# Patient Record
Sex: Female | Born: 1945 | Race: White | Hispanic: No | Marital: Married | Smoking: Former smoker
Health system: Southern US, Community
[De-identification: ages and names within clinical notes are randomized; demographics above are authoritative.]

## PROBLEM LIST (undated history)

## (undated) DIAGNOSIS — I739 Peripheral vascular disease, unspecified: Secondary | ICD-10-CM

## (undated) DIAGNOSIS — I493 Ventricular premature depolarization: Secondary | ICD-10-CM

## (undated) DIAGNOSIS — I480 Paroxysmal atrial fibrillation: Secondary | ICD-10-CM

## (undated) DIAGNOSIS — D472 Monoclonal gammopathy: Secondary | ICD-10-CM

## (undated) DIAGNOSIS — I2510 Atherosclerotic heart disease of native coronary artery without angina pectoris: Secondary | ICD-10-CM

## (undated) DIAGNOSIS — I447 Left bundle-branch block, unspecified: Secondary | ICD-10-CM

## (undated) DIAGNOSIS — I495 Sick sinus syndrome: Secondary | ICD-10-CM

## (undated) DIAGNOSIS — Z9289 Personal history of other medical treatment: Secondary | ICD-10-CM

## (undated) DIAGNOSIS — I779 Disorder of arteries and arterioles, unspecified: Secondary | ICD-10-CM

## (undated) DIAGNOSIS — Z720 Tobacco use: Secondary | ICD-10-CM

## (undated) DIAGNOSIS — I10 Essential (primary) hypertension: Secondary | ICD-10-CM

## (undated) DIAGNOSIS — E785 Hyperlipidemia, unspecified: Secondary | ICD-10-CM

## (undated) HISTORY — DX: Essential (primary) hypertension: I10

## (undated) HISTORY — DX: Paroxysmal atrial fibrillation: I48.0

## (undated) HISTORY — DX: Tobacco use: Z72.0

## (undated) HISTORY — PX: TONSILLECTOMY: SUR1361

## (undated) HISTORY — DX: Atherosclerotic heart disease of native coronary artery without angina pectoris: I25.10

## (undated) HISTORY — PX: INGUINAL HERNIA REPAIR: SUR1180

## (undated) HISTORY — PX: OTHER SURGICAL HISTORY: SHX169

## (undated) HISTORY — DX: Left bundle-branch block, unspecified: I44.7

---

## 1998-07-14 ENCOUNTER — Other Ambulatory Visit: Admission: RE | Admit: 1998-07-14 | Discharge: 1998-07-14 | Payer: Self-pay | Admitting: Obstetrics and Gynecology

## 1999-09-09 ENCOUNTER — Other Ambulatory Visit: Admission: RE | Admit: 1999-09-09 | Discharge: 1999-09-09 | Payer: Self-pay | Admitting: Obstetrics and Gynecology

## 1999-09-09 ENCOUNTER — Encounter: Admission: RE | Admit: 1999-09-09 | Discharge: 1999-09-09 | Payer: Self-pay | Admitting: Obstetrics and Gynecology

## 1999-09-09 ENCOUNTER — Encounter: Payer: Self-pay | Admitting: Obstetrics and Gynecology

## 2001-08-15 ENCOUNTER — Ambulatory Visit (HOSPITAL_COMMUNITY): Admission: RE | Admit: 2001-08-15 | Discharge: 2001-08-15 | Payer: Self-pay | Admitting: Obstetrics and Gynecology

## 2001-08-15 ENCOUNTER — Encounter: Payer: Self-pay | Admitting: Obstetrics and Gynecology

## 2001-09-05 ENCOUNTER — Other Ambulatory Visit: Admission: RE | Admit: 2001-09-05 | Discharge: 2001-09-05 | Payer: Self-pay | Admitting: Obstetrics and Gynecology

## 2002-10-30 ENCOUNTER — Encounter: Admission: RE | Admit: 2002-10-30 | Discharge: 2002-10-30 | Payer: Self-pay | Admitting: Obstetrics and Gynecology

## 2002-10-30 ENCOUNTER — Encounter: Payer: Self-pay | Admitting: Obstetrics and Gynecology

## 2003-03-21 ENCOUNTER — Other Ambulatory Visit: Admission: RE | Admit: 2003-03-21 | Discharge: 2003-03-21 | Payer: Self-pay | Admitting: Obstetrics and Gynecology

## 2003-11-16 ENCOUNTER — Emergency Department (HOSPITAL_COMMUNITY): Admission: EM | Admit: 2003-11-16 | Discharge: 2003-11-16 | Payer: Self-pay | Admitting: Family Medicine

## 2003-12-03 ENCOUNTER — Encounter: Admission: RE | Admit: 2003-12-03 | Discharge: 2003-12-03 | Payer: Self-pay | Admitting: Obstetrics and Gynecology

## 2005-04-01 ENCOUNTER — Encounter: Admission: RE | Admit: 2005-04-01 | Discharge: 2005-04-01 | Payer: Self-pay | Admitting: Obstetrics and Gynecology

## 2006-04-03 ENCOUNTER — Encounter: Admission: RE | Admit: 2006-04-03 | Discharge: 2006-04-03 | Payer: Self-pay | Admitting: Obstetrics and Gynecology

## 2006-04-10 ENCOUNTER — Encounter: Admission: RE | Admit: 2006-04-10 | Discharge: 2006-04-10 | Payer: Self-pay | Admitting: Obstetrics and Gynecology

## 2006-04-17 ENCOUNTER — Encounter: Admission: RE | Admit: 2006-04-17 | Discharge: 2006-04-17 | Payer: Self-pay | Admitting: Obstetrics and Gynecology

## 2006-04-17 ENCOUNTER — Encounter (INDEPENDENT_AMBULATORY_CARE_PROVIDER_SITE_OTHER): Payer: Self-pay | Admitting: Specialist

## 2006-10-17 ENCOUNTER — Encounter: Admission: RE | Admit: 2006-10-17 | Discharge: 2006-10-17 | Payer: Self-pay | Admitting: Obstetrics and Gynecology

## 2006-12-15 ENCOUNTER — Encounter: Admission: RE | Admit: 2006-12-15 | Discharge: 2006-12-15 | Payer: Self-pay | Admitting: General Surgery

## 2007-06-29 ENCOUNTER — Encounter: Admission: RE | Admit: 2007-06-29 | Discharge: 2007-06-29 | Payer: Self-pay | Admitting: Cardiology

## 2007-07-25 ENCOUNTER — Ambulatory Visit: Payer: Self-pay | Admitting: Hematology & Oncology

## 2007-08-06 LAB — COMPREHENSIVE METABOLIC PANEL
ALT: 12 U/L (ref 0–35)
Albumin: 4.3 g/dL (ref 3.5–5.2)
CO2: 25 mEq/L (ref 19–32)
Calcium: 9.6 mg/dL (ref 8.4–10.5)
Chloride: 106 mEq/L (ref 96–112)
Creatinine, Ser: 0.64 mg/dL (ref 0.40–1.20)
Potassium: 3.9 mEq/L (ref 3.5–5.3)
Total Protein: 7.5 g/dL (ref 6.0–8.3)

## 2007-08-06 LAB — CBC & DIFF AND RETIC
Basophils Absolute: 0.1 10*3/uL (ref 0.0–0.1)
EOS%: 1.6 % (ref 0.0–7.0)
HCT: 35.8 % (ref 34.8–46.6)
HGB: 12.4 g/dL (ref 11.6–15.9)
IRF: 0.32 (ref 0.130–0.330)
MCH: 32.1 pg (ref 26.0–34.0)
MCV: 92.1 fL (ref 81.0–101.0)
NEUT%: 52.5 % (ref 39.6–76.8)
lymph#: 2.3 10*3/uL (ref 0.9–3.3)

## 2007-08-06 LAB — LACTATE DEHYDROGENASE: LDH: 159 U/L (ref 94–250)

## 2007-08-06 LAB — CHCC SMEAR

## 2007-08-08 LAB — SPEP & IFE WITH QIG
Alpha-1-Globulin: 5.1 % — ABNORMAL HIGH (ref 2.9–4.9)
Alpha-2-Globulin: 13.3 % — ABNORMAL HIGH (ref 7.1–11.8)
IgM, Serum: 118 mg/dL (ref 60–263)
Total Protein, Serum Electrophoresis: 7.6 g/dL (ref 6.0–8.3)

## 2007-08-08 LAB — BETA 2 MICROGLOBULIN, SERUM: Beta-2 Microglobulin: 1.47 mg/L (ref 1.01–1.73)

## 2007-08-09 ENCOUNTER — Encounter: Admission: RE | Admit: 2007-08-09 | Discharge: 2007-08-09 | Payer: Self-pay | Admitting: Cardiology

## 2007-08-16 ENCOUNTER — Ambulatory Visit (HOSPITAL_COMMUNITY): Admission: RE | Admit: 2007-08-16 | Discharge: 2007-08-16 | Payer: Self-pay | Admitting: Hematology & Oncology

## 2007-11-01 ENCOUNTER — Ambulatory Visit: Payer: Self-pay | Admitting: Hematology & Oncology

## 2007-11-05 LAB — CBC WITH DIFFERENTIAL (CANCER CENTER ONLY)
BASO%: 0.6 % (ref 0.0–2.0)
EOS%: 2.4 % (ref 0.0–7.0)
HCT: 37.1 % (ref 34.8–46.6)
LYMPH#: 2.4 10*3/uL (ref 0.9–3.3)
MCHC: 34.3 g/dL (ref 32.0–36.0)
MONO#: 0.5 10*3/uL (ref 0.1–0.9)
NEUT#: 3.3 10*3/uL (ref 1.5–6.5)
NEUT%: 52.3 % (ref 39.6–80.0)
RDW: 10.5 % (ref 10.5–14.6)
WBC: 6.4 10*3/uL (ref 3.9–10.0)

## 2007-11-07 LAB — COMPREHENSIVE METABOLIC PANEL
ALT: 12 U/L (ref 0–35)
AST: 19 U/L (ref 0–37)
Creatinine, Ser: 0.71 mg/dL (ref 0.40–1.20)
Total Bilirubin: 0.4 mg/dL (ref 0.3–1.2)

## 2007-11-07 LAB — PROTEIN ELECTROPHORESIS, SERUM
Alpha-1-Globulin: 4.9 % (ref 2.9–4.9)
Gamma Globulin: 15.1 % (ref 11.1–18.8)
M-Spike, %: 0.44 g/dL

## 2007-11-07 LAB — KAPPA/LAMBDA LIGHT CHAINS
Kappa:Lambda Ratio: 0.97 (ref 0.26–1.65)
Lambda Free Lght Chn: 1.76 mg/dL (ref 0.57–2.63)

## 2007-11-07 LAB — BETA 2 MICROGLOBULIN, SERUM: Beta-2 Microglobulin: 1.81 mg/L — ABNORMAL HIGH (ref 1.01–1.73)

## 2007-11-07 LAB — IGG, IGA, IGM
IgA: 273 mg/dL (ref 68–378)
IgM, Serum: 134 mg/dL (ref 60–263)

## 2008-02-27 ENCOUNTER — Ambulatory Visit: Payer: Self-pay | Admitting: Hematology & Oncology

## 2008-05-01 ENCOUNTER — Ambulatory Visit: Payer: Self-pay | Admitting: Hematology & Oncology

## 2008-05-02 LAB — CBC WITH DIFFERENTIAL (CANCER CENTER ONLY)
BASO#: 0.1 10*3/uL (ref 0.0–0.2)
BASO%: 0.8 % (ref 0.0–2.0)
HCT: 39 % (ref 34.8–46.6)
HGB: 13.3 g/dL (ref 11.6–15.9)
LYMPH#: 2.2 10*3/uL (ref 0.9–3.3)
LYMPH%: 32 % (ref 14.0–48.0)
MCV: 92 fL (ref 81–101)
MONO#: 0.5 10*3/uL (ref 0.1–0.9)
NEUT%: 57 % (ref 39.6–80.0)
RDW: 11 % (ref 10.5–14.6)
WBC: 6.8 10*3/uL (ref 3.9–10.0)

## 2008-05-06 LAB — PROTEIN ELECTROPHORESIS, SERUM
Albumin ELP: 53.6 % — ABNORMAL LOW (ref 55.8–66.1)
Alpha-1-Globulin: 4.9 % (ref 2.9–4.9)
Beta 2: 4.9 % (ref 3.2–6.5)
Beta Globulin: 6.1 % (ref 4.7–7.2)

## 2008-05-06 LAB — COMPREHENSIVE METABOLIC PANEL
Alkaline Phosphatase: 69 U/L (ref 39–117)
BUN: 11 mg/dL (ref 6–23)
Creatinine, Ser: 0.64 mg/dL (ref 0.40–1.20)
Glucose, Bld: 74 mg/dL (ref 70–99)
Total Bilirubin: 0.4 mg/dL (ref 0.3–1.2)

## 2008-05-06 LAB — IGG, IGA, IGM: IgM, Serum: 148 mg/dL (ref 60–263)

## 2008-06-12 ENCOUNTER — Ambulatory Visit: Payer: Self-pay | Admitting: Internal Medicine

## 2008-06-12 ENCOUNTER — Inpatient Hospital Stay (HOSPITAL_COMMUNITY): Admission: EM | Admit: 2008-06-12 | Discharge: 2008-06-18 | Payer: Self-pay | Admitting: Emergency Medicine

## 2008-06-17 ENCOUNTER — Ambulatory Visit: Payer: Self-pay | Admitting: Vascular Surgery

## 2008-06-17 ENCOUNTER — Encounter: Payer: Self-pay | Admitting: Cardiology

## 2008-10-06 ENCOUNTER — Encounter: Admission: RE | Admit: 2008-10-06 | Discharge: 2008-10-06 | Payer: Self-pay | Admitting: Obstetrics and Gynecology

## 2008-10-24 ENCOUNTER — Ambulatory Visit: Payer: Self-pay | Admitting: Hematology & Oncology

## 2008-10-27 LAB — CBC WITH DIFFERENTIAL (CANCER CENTER ONLY)
Eosinophils Absolute: 0.1 10*3/uL (ref 0.0–0.5)
HCT: 37.9 % (ref 34.8–46.6)
LYMPH#: 2.3 10*3/uL (ref 0.9–3.3)
LYMPH%: 48.5 % — ABNORMAL HIGH (ref 14.0–48.0)
MCV: 93 fL (ref 81–101)
MONO#: 0.4 10*3/uL (ref 0.1–0.9)
NEUT%: 40.9 % (ref 39.6–80.0)
Platelets: 313 10*3/uL (ref 145–400)
RBC: 4.1 10*6/uL (ref 3.70–5.32)
WBC: 4.8 10*3/uL (ref 3.9–10.0)

## 2008-10-29 LAB — COMPREHENSIVE METABOLIC PANEL
ALT: 10 U/L (ref 0–35)
Albumin: 4.2 g/dL (ref 3.5–5.2)
CO2: 24 mEq/L (ref 19–32)
Calcium: 8.9 mg/dL (ref 8.4–10.5)
Chloride: 108 mEq/L (ref 96–112)
Glucose, Bld: 83 mg/dL (ref 70–99)
Sodium: 142 mEq/L (ref 135–145)
Total Bilirubin: 0.4 mg/dL (ref 0.3–1.2)
Total Protein: 6.9 g/dL (ref 6.0–8.3)

## 2008-10-29 LAB — IGG, IGA, IGM
IgA: 265 mg/dL (ref 68–378)
IgG (Immunoglobin G), Serum: 1230 mg/dL (ref 694–1618)

## 2008-10-29 LAB — PROTEIN ELECTROPHORESIS, SERUM
Albumin ELP: 57.4 % (ref 55.8–66.1)
Alpha-1-Globulin: 3.9 % (ref 2.9–4.9)
Alpha-2-Globulin: 10.7 % (ref 7.1–11.8)
Beta 2: 4.6 % (ref 3.2–6.5)
Beta Globulin: 6.1 % (ref 4.7–7.2)
Gamma Globulin: 17.3 % (ref 11.1–18.8)
Total Protein, Serum Electrophoresis: 6.9 g/dL (ref 6.0–8.3)

## 2008-10-29 LAB — KAPPA/LAMBDA LIGHT CHAINS
Kappa free light chain: 1.86 mg/dL (ref 0.33–1.94)
Kappa:Lambda Ratio: 0.95 (ref 0.26–1.65)
Lambda Free Lght Chn: 1.95 mg/dL (ref 0.57–2.63)

## 2009-03-06 ENCOUNTER — Emergency Department (HOSPITAL_COMMUNITY): Admission: EM | Admit: 2009-03-06 | Discharge: 2009-03-06 | Payer: Self-pay | Admitting: Emergency Medicine

## 2009-03-07 ENCOUNTER — Ambulatory Visit: Payer: Self-pay | Admitting: Internal Medicine

## 2009-03-07 ENCOUNTER — Inpatient Hospital Stay (HOSPITAL_COMMUNITY): Admission: EM | Admit: 2009-03-07 | Discharge: 2009-03-08 | Payer: Self-pay | Admitting: Emergency Medicine

## 2009-10-07 ENCOUNTER — Encounter: Admission: RE | Admit: 2009-10-07 | Discharge: 2009-10-07 | Payer: Self-pay | Admitting: Obstetrics and Gynecology

## 2010-02-15 ENCOUNTER — Encounter: Admission: RE | Admit: 2010-02-15 | Discharge: 2010-02-15 | Payer: Self-pay | Admitting: Cardiology

## 2010-03-09 ENCOUNTER — Ambulatory Visit: Payer: Self-pay | Admitting: Cardiology

## 2010-04-02 ENCOUNTER — Ambulatory Visit: Payer: Self-pay | Admitting: Cardiology

## 2010-05-06 ENCOUNTER — Observation Stay (HOSPITAL_COMMUNITY)
Admission: EM | Admit: 2010-05-06 | Discharge: 2010-05-07 | Payer: Self-pay | Source: Home / Self Care | Attending: Cardiology | Admitting: Cardiology

## 2010-05-09 ENCOUNTER — Encounter: Payer: Self-pay | Admitting: Cardiology

## 2010-05-10 ENCOUNTER — Telehealth (INDEPENDENT_AMBULATORY_CARE_PROVIDER_SITE_OTHER): Payer: Self-pay | Admitting: *Deleted

## 2010-05-10 LAB — DIFFERENTIAL
Lymphs Abs: 2.6 10*3/uL (ref 0.7–4.0)
Monocytes Relative: 10 % (ref 3–12)
Neutro Abs: 2.5 10*3/uL (ref 1.7–7.7)
Neutrophils Relative %: 42 % — ABNORMAL LOW (ref 43–77)

## 2010-05-10 LAB — CARDIAC PANEL(CRET KIN+CKTOT+MB+TROPI)
CK, MB: 1.6 ng/mL (ref 0.3–4.0)
CK, MB: 1.4 ng/mL (ref 0.3–4.0)
Total CK: 73 U/L (ref 7–177)
Troponin I: 0.02 ng/mL (ref 0.00–0.06)

## 2010-05-10 LAB — LIPID PANEL
Cholesterol: 218 mg/dL — ABNORMAL HIGH (ref 0–200)
HDL: 42 mg/dL (ref >39)
Triglycerides: 95 mg/dL (ref <150)

## 2010-05-10 LAB — CBC
HCT: 37.1 % (ref 36.0–46.0)
Hemoglobin: 12.2 g/dL (ref 12.0–15.0)
Platelets: 290 10*3/uL (ref 150–400)

## 2010-05-10 LAB — POCT CARDIAC MARKERS
CKMB, poc: 1.3 ng/mL (ref 1.0–8.0)
Troponin i, poc: <0.05 ng/mL (ref 0.00–0.09)

## 2010-05-10 LAB — BASIC METABOLIC PANEL
CO2: 25 mEq/L (ref 19–32)
Calcium: 9 mg/dL (ref 8.4–10.5)
Chloride: 108 mEq/L (ref 96–112)
Creatinine, Ser: 0.61 mg/dL (ref 0.4–1.2)
Potassium: 3.7 mEq/L (ref 3.5–5.1)

## 2010-05-10 LAB — TSH: TSH: 3.758 u[IU]/mL (ref 0.350–4.500)

## 2010-05-10 LAB — TROPONIN I: Troponin I: 0.02 ng/mL (ref 0.00–0.06)

## 2010-05-11 ENCOUNTER — Encounter (HOSPITAL_COMMUNITY)
Admission: RE | Admit: 2010-05-11 | Discharge: 2010-05-18 | Payer: Self-pay | Source: Home / Self Care | Attending: Cardiology | Admitting: Cardiology

## 2010-05-11 ENCOUNTER — Encounter: Payer: Self-pay | Admitting: Cardiovascular Disease

## 2010-05-11 ENCOUNTER — Ambulatory Visit: Admission: RE | Admit: 2010-05-11 | Discharge: 2010-05-11 | Payer: Self-pay | Source: Home / Self Care

## 2010-05-13 ENCOUNTER — Inpatient Hospital Stay (HOSPITAL_COMMUNITY)
Admission: AD | Admit: 2010-05-13 | Discharge: 2010-05-15 | Payer: Self-pay | Source: Home / Self Care | Attending: Cardiology | Admitting: Cardiology

## 2010-05-13 LAB — BASIC METABOLIC PANEL
BUN: 14 mg/dL (ref 6–23)
Chloride: 105 mEq/L (ref 96–112)
GFR calc non Af Amer: >60 mL/min (ref >60)
Potassium: 4 mEq/L (ref 3.5–5.1)
Sodium: 140 mEq/L (ref 135–145)

## 2010-05-13 LAB — PROTIME-INR
INR: 0.94 (ref 0.00–1.49)
Prothrombin Time: 12.8 seconds (ref 11.6–15.2)

## 2010-05-13 LAB — CBC
HCT: 34.5 % — ABNORMAL LOW (ref 36.0–46.0)
Hemoglobin: 11.3 g/dL — ABNORMAL LOW (ref 12.0–15.0)
MCHC: 32.8 g/dL (ref 30.0–36.0)
RBC: 3.64 MIL/uL — ABNORMAL LOW (ref 3.87–5.11)

## 2010-05-13 LAB — CARDIAC PANEL(CRET KIN+CKTOT+MB+TROPI)
CK, MB: 2.5 ng/mL (ref 0.3–4.0)
Relative Index: INVALID (ref 0.0–2.5)
Total CK: 78 U/L (ref 7–177)
Troponin I: 0.01 ng/mL (ref 0.00–0.06)

## 2010-05-14 ENCOUNTER — Ambulatory Visit: Payer: Self-pay | Admitting: Cardiology

## 2010-05-14 HISTORY — PX: CARDIAC CATHETERIZATION: SHX172

## 2010-05-14 LAB — CBC
HCT: 34.2 % — ABNORMAL LOW (ref 36.0–46.0)
Hemoglobin: 11.4 g/dL — ABNORMAL LOW (ref 12.0–15.0)
MCH: 31.1 pg (ref 26.0–34.0)
MCHC: 33.3 g/dL (ref 30.0–36.0)
MCV: 93.4 fL (ref 78.0–100.0)
RDW: 13.2 % (ref 11.5–15.5)

## 2010-05-14 LAB — BASIC METABOLIC PANEL
BUN: 17 mg/dL (ref 6–23)
CO2: 27 mEq/L (ref 19–32)
Calcium: 8.6 mg/dL (ref 8.4–10.5)
Creatinine, Ser: 0.65 mg/dL (ref 0.4–1.2)
GFR calc non Af Amer: >60 mL/min (ref >60)
Glucose, Bld: 84 mg/dL (ref 70–99)
Sodium: 140 mEq/L (ref 135–145)

## 2010-05-14 LAB — CARDIAC PANEL(CRET KIN+CKTOT+MB+TROPI)
CK, MB: 2 ng/mL (ref 0.3–4.0)
Troponin I: 0.01 ng/mL (ref 0.00–0.06)

## 2010-05-15 LAB — BASIC METABOLIC PANEL
CO2: 28 mEq/L (ref 19–32)
Chloride: 105 mEq/L (ref 96–112)
Creatinine, Ser: 0.67 mg/dL (ref 0.4–1.2)
GFR calc Af Amer: >60 mL/min (ref >60)
Glucose, Bld: 93 mg/dL (ref 70–99)

## 2010-05-15 LAB — CBC
MCH: 30.6 pg (ref 26.0–34.0)
MCHC: 32.3 g/dL (ref 30.0–36.0)
MCV: 94.6 fL (ref 78.0–100.0)
Platelets: 282 10*3/uL (ref 150–400)
RBC: 3.86 MIL/uL — ABNORMAL LOW (ref 3.87–5.11)

## 2010-05-15 LAB — PLATELET INHIBITION P2Y12: P2Y12 % Inhibition: 15 %

## 2010-05-16 NOTE — H&P (Signed)
Katherine [name], SPANG NO.:  [id number]  MEDICAL RECORD NO.:  [id number]          PATIENT TYPE:  OBV  LOCATION:  3701                         FACILITY:  [facility]  PHYSICIAN:  Luis Abed, MD, FACCDATE OF BIRTH:  June 15, 1945  DATE OF ADMISSION:  05/06/2010 DATE OF DISCHARGE:                             HISTORY & PHYSICAL   PRIMARY CARE PHYSICIAN AND CARDIOLOGIST:  Cassell Clement, MD  CHIEF COMPLAINT:  Chest pain.  HISTORY OF PRESENT ILLNESS:  [name] is a 65 year old female with no previous history of coronary artery disease.  She had an episode of chest pain last Friday.  This started at rest but after eating.  She took aspirin 325 mg when her symptoms increased to a 7 or 8/10.  Her symptoms resolved after about 30 minutes without any other intervention. This a.m. at approximately 3 o'clock, she was wakened by substernal chest pain she describes as a pressure.  It was a 7 or 8/10.  It did not radiate.  There were no associated symptoms.  The pain was as severe as the previous episode and she took another aspirin.  When her symptoms did not resolve immediately, EMS was called but the symptoms had begun to ease off by the time they got there and the total duration of her chest pain was about 45 minutes.  These were the only 2 episodes of chest pain she has ever had.  Generally she cleans house, mops, and vacuums as well as walks occasionally in the gym without symptoms.  She also takes care of her grandchildren.  She is busy but admits that her activity level is not that high.  Currently she is resting comfortably.  PAST MEDICAL HISTORY: 1. Hypertension. 2. Hyperlipidemia. 3. Family history of coronary artery disease. 4 . Ongoing tobacco use. 1. Remote history of pneumonia in 2010. 2. History of monoclonal gammopathy of uncertain significance     evaluated by Dr. Arlan Organ in 2009. 3. History of left bundle-branch block. 4. History of  PVCs.  SURGICAL HISTORY:  She had a tonsillar operation as a teenage.  ALLERGIES:  She is allergic or intolerant to SULFA, CRESTOR, ZOCOR with cramping and ZETIA with hives.  CURRENT MEDICATIONS: 1. Multivitamin daily. 2. Vitamin C and vitamin D three daily. 3. Amlodipine 5 mg daily. 4. Aspirin 81 mg a day.  SOCIAL HISTORY:  She lives in [place] with her husband.  She is a retired Print production planner.  She has approximately 35 pack-year history of ongoing tobacco use, although she did quit for about 6 months in 2010. She denies alcohol or drug abuse.  FAMILY HISTORY:  Her mother died at 60 of a suicide but had a history of cardiomyopathy.  Her father died at age 52 with a history of heart failure.  No siblings have cardiac issues.  REVIEW OF SYSTEMS:  She has felt some anxiety recently.  The chest pain is described above.  She coughs occasionally and denies significant wheezing.  Cough is not productive.  She has occasional arthralgias. She denies any history of reflux symptoms or melena.  She has not  been sick recently or had fevers or chills.  Full 14-point review of systems is otherwise negative except as stated in the HPI.  PHYSICAL EXAMINATION:  VITAL SIGNS:  Temperature is 97.7, blood pressure 169/67, pulse 49, respiratory rate 16, O2 saturation 100% on room air. GENERAL:  She is a well-developed, slender white female in no acute distress. HEENT:  Normal. NECK:  There is no lymphadenopathy, thyromegaly, or JVD noted but she has bilateral carotid bruits, right greater than left. CV:  Her heart is regular in rate and rhythm with an S1, S2 and a soft systolic murmur is noted at the left and right upper sternal border. Distal pulses are 2+ in all 4 extremities.  LUNGS:  Clear except for a few rales. SKIN:  No rashes or lesions are noted. ABDOMEN:  Soft and nontender with active bowel sounds. EXTREMITIES:  There is no cyanosis, clubbing, or edema noted. MUSCULOSKELETAL:   There is no joint deformity or effusions and no spine or CVA tenderness. NEURO:  She is alert and oriented.  Cranial nerves II-XII grossly intact.  Chest x-ray shows bibasilar atelectasis.  EKG is sinus bradycardia, rate 47 with some different morphology changes from an EKG dated November 2010.  Her QRS duration is now only 102 milliseconds and therefore does not fit the criteria for a left bundle- branch block.  LABORATORY VALUES:  Hemoglobin 12.2, hematocrit 37.1, WBC 6.0, platelets 290,000.  Sodium 142, potassium 3.7, chloride 108, CO2 25, BUN 11, creatinine 0.61, glucose 95.  Point-of-care markers negative x2.  IMPRESSION:  [name] was seen today by Dr. Myrtis Ser, the patient evaluated and the data reviewed. 1. Chest pain.  She has atypical and typical features.  Her enzymes     are negative so far and her EKG has no acute ischemic changes.  She     will be admitted and we will rule out MI.  If her cardiac enzymes     are elevated, we will add heparin and nitroglycerin.  Otherwise if     she rules out, consideration can be given to an outpatient Myoview     which has already been scheduled for next Tuesday. 2. Hyperlipidemia:  A lipid profile has been ordered and Ms. Leet     agrees to try Zocor which we will use at a low dose and up titrate     as needed and as tolerated. 3. Ongoing tobacco use.  The patient states that she will not smoke     again. 4. Monoclonal gammopathy:  Follow up with Dr. [name] p.r.n. 5. Left bundle-branch block on prior EKGs but not a left bundle-branch     block today.  She has anterior Q-waves     and some mild ST changes without acute ST elevation.  We will     repeat the EKG to be sure this is hers.  A 2-D echocardiogram will     also be ordered.  Further evaluation and treatment will depend on     results of the above testing.     Theodore Demark, PA-C   ______________________________ Luis Abed, MD, [facility]    RB/MEDQ  D:  05/06/2010   T:  05/06/2010  Job:  [id number]  Electronically Signed by Theodore Demark PA-C on 05/10/2010 05:36:37 PM Electronically Signed by Willa Rough MD FACC on 05/16/2010 12:52:21 PM

## 2010-05-17 NOTE — Discharge Summary (Signed)
[name], [name] NO.:  [id number]  MEDICAL RECORD NO.:  [id number]          PATIENT TYPE:  OBV  LOCATION:  3701                         FACILITY:  [facility]  PHYSICIAN:  Verne Carrow, MDDATE OF BIRTH:  1945/12/27  DATE OF ADMISSION:  05/06/2010 DATE OF DISCHARGE:  05/07/2010                              DISCHARGE SUMMARY   PRIMARY CARDIOLOGIST:  Cassell Clement, MD  DISCHARGE DIAGNOSIS:  Chest pain.  SECONDARY DIAGNOSES: 1. Hypertension. 2. Hyperlipidemia. 3. Ongoing tobacco abuse. 4. History of mild aortic stenosis. 5. Soft right carotid bruit.  ALLERGIES: 1. HIGH-DOSE ZOCOR. 2. CRESTOR. 3. ZETIA.  PROCEDURES:  On May 07, 2010, 2-D echocardiogram, EF 60% with moderate LVH, no regional wall motion abnormalities.  No aortic stenosis or any valvular issues.  HISTORY OF PRESENT ILLNESS:  This is a 65 year old female without prior cardiac history, although she does have a history of chest pain with reportedly normal catheterization in 1990s.  She also had a negative Myoview in 2010.  The patient was in her usual state of health about a week prior to admission when she began to experience intermittent symptoms of chest pain, generally occurring at rest at one point with radiation to her jaw and relieved within 20-30 minutes.  She usually takes an aspirin orally for this and symptoms abate within about 10-15 minutes after taking the aspirin.  Because of recurrent symptoms on May 06, 2010, she presented to the ED where her ECG showed slight ST- segment flattening in V5 and V6 with anterior T-wave inversion which was different from the only old ECG we had from 2010.  She was noted admitted for further evaluation.  HOSPITAL COURSE:  The patient ruled out for MI.  She had no recurrent chest discomfort.  She underwent 2-D echocardiogram which showed normal LV function without wall motion abnormalities.  She already has an exercise Myoview  scheduled in our office on Tuesday, May 11, 2010, and therefore she will be discharged home today for followup Myoview. If Myoview would be abnormal, the patient would undergo catheterization in the future.  DISCHARGE LABORATORY DATA:  Hemoglobin  12.2, hematocrit 37.1, WBC 6.0, and platelets 290.  Sodium 142, potassium 3.7, chloride 108, CO2 of 25, BUN 11, creatinine 0.61, and glucose 99.  CK 62, MB 1.4, and troponin-I 0.01.  Total cholesterol 218, triglycerides 95, HDL 42, and LDL 157. TSH 3.758.  DISPOSITION:  The patient will be discharged home today in good condition.  FOLLOWUP PLANS AND APPOINTMENTS:  The patient will undergo exercise Myoview on Tuesday, May 11, 2010, at 11:30 a.m.  She will follow up with Dr. Patty Sermons as previously scheduled.  DISCHARGE MEDICATIONS: 1. Nitroglycerin 0.4 mg sublingual p.r.n. chest pain. 2. Omeprazole 20 mg daily. 3. Simvastatin 10 mg daily (the patient is willing to try low dose). 4. Amlodipine 5 mg daily. 5. Aspirin 81 mg daily. 6. Multivitamin daily. 7. Vitamin C over-the-counter daily. 8. Vitamin D3 over-the-counter daily.  OUTSTANDING LABORATORY DATA AND STUDIES:  Followup Myoview as above.  DURATION OF DISCHARGE ENCOUNTER:  60 minutes including physician time.     Katherine [name],  ANP   ______________________________ Verne Carrow, MD    CB/MEDQ  D:  05/07/2010  T:  05/08/2010  Job:  [id number]  Electronically Signed by Katherine [name] ANP on 05/17/2010 12:24:37 PM Electronically Signed by Verne Carrow MD on 05/17/2010 02:44:34 PM

## 2010-05-20 NOTE — Progress Notes (Signed)
Summary: Nuclear pre procedure  Phone Note Outgoing Call Call back at [facility] Phone [phone]   Call placed by: Rea College, CMA,  May 10, 2010 4:13 PM Call placed to: Patient Summary of Call: Reviewed information on Myoview Information Sheet (see scanned document for further details).  Thurston Hole spoke with patient.      Nuclear Med Background Indications for Stress Test: Evaluation for Ischemia, Post Hospital, Abnormal EKG  Indications Comments: 05/07/10 chest pain, MI r/o  History: CT/MRI, Echo, Heart Catheterization, Myocardial Perfusion Study  History Comments: '90's Cath:normal; '10 Cardiac JY:NWGNFAOZ artery calcifications; '10 HYQ:MVHQIO, EF=67%; 05/07/10 Echo:EF=60%; h/o mild AS  Symptoms: Chest Pain  Symptoms Comments: CP>jaw   Nuclear Pre-Procedure Cardiac Risk Factors: Hypertension, LBBB, Lipids, Smoker

## 2010-05-20 NOTE — Assessment & Plan Note (Signed)
Summary: Cardiology Nuclear Testing  Nuclear Med Background Indications for Stress Test: Evaluation for Ischemia, Post Hospital, Abnormal EKG  Indications Comments: 05/07/10 chest pain, MI r/o  History: CT/MRI, Echo, Heart Catheterization, Myocardial Perfusion Study  History Comments: '90's Cath:normal; '10 Cardiac EA:VWUJWJXB artery calcifications; '10 JYN:WGNFAO, EF=67%; 05/07/10 Echo:EF=60%; h/o mild AS  Symptoms: Chest Pressure, Palpitations  Symptoms Comments: CP>jaw. Last episode of ZH:YQMV since d/c.   Nuclear Pre-Procedure Cardiac Risk Factors: Hypertension, LBBB, Lipids, Smoker Caffeine/Decaff Intake: 1 cup decaf @ 7am NPO After: 7:00 AM Lungs: Clear.  O2 Sat 99% on RA. IV 0.9% NS with Angio Cath: 18g     IV Site: R Antecubital IV Started by: Stanton Kidney, EMT-P Chest Size (in) 36     Cup Size C     Height (in): 65 Weight (lb): 132 BMI: 22.05 Tech Comments: (R) carotid bruit  Nuclear Med Study 1 or 2 day study:  1 day     Stress Test Type:  Adenosine Reading MD:  Charlton Haws, MD     Referring MD:  Cassell Clement, MD Resting Radionuclide:  Technetium 65m Tetrofosmin     Resting Radionuclide Dose:  11 mCi  Stress Radionuclide:  Technetium 45m Tetrofosmin     Stress Radionuclide Dose:  33 mCi   Stress Protocol  Dose of Adenosine:  33.6 mg    Stress Test Technologist:  Rea College, CMA-N     Nuclear Technologist:  Doyne Keel, CNMT  Rest Procedure  Myocardial perfusion imaging was performed at rest 45 minutes following the intravenous administration of Technetium 61m Tetrofosmin.  Stress Procedure  Baseline EKG shows intermittent LBBB and occasional PVC's.  The patient received IV adenosine at 140 mcg/kg/min for 4 minutes. There were no significant changes with infusion. There was a possible short run of SVT in recovery.  She did c/o chest and jaw pressure with infusion.  Technetium 70m Tetrofosmin was injected at the 2 minute mark and quantitative spect images were  obtained after a 45 minute delay.  QPS Raw Data Images:  Normal; no motion artifact; normal heart/lung ratio. Stress Images:  Normal homogeneous uptake in all areas of the myocardium. Rest Images:  Normal homogeneous uptake in all areas of the myocardium. Subtraction (SDS):  Normal Transient Ischemic Dilatation:  1.11  (Normal <1.22)  Lung/Heart Ratio:  .30  (Normal <0.45)  Quantitative Gated Spect Images QGS EDV:  84 ml QGS ESV:  23 ml QGS EF:  73 %  Findings Normal nuclear study      Overall Impression  Exercise Capacity: Adenosine study with no exercise. BP Response: Normal blood pressure response. Clinical Symptoms: Warm and Jaw Pain ECG Impression: anterolateral T wave changes at baseline IVCD Overall Impression: Normal stress nuclear study.

## 2010-05-24 ENCOUNTER — Telehealth: Payer: Self-pay | Admitting: Internal Medicine

## 2010-05-24 ENCOUNTER — Ambulatory Visit (INDEPENDENT_AMBULATORY_CARE_PROVIDER_SITE_OTHER): Payer: 59 | Admitting: Cardiology

## 2010-05-24 DIAGNOSIS — Z79899 Other long term (current) drug therapy: Secondary | ICD-10-CM

## 2010-05-24 DIAGNOSIS — E7800 Pure hypercholesterolemia, unspecified: Secondary | ICD-10-CM

## 2010-05-24 DIAGNOSIS — I2510 Atherosclerotic heart disease of native coronary artery without angina pectoris: Secondary | ICD-10-CM

## 2010-05-24 DIAGNOSIS — I209 Angina pectoris, unspecified: Secondary | ICD-10-CM

## 2010-05-27 ENCOUNTER — Ambulatory Visit: Payer: 59 | Admitting: Cardiology

## 2010-05-31 ENCOUNTER — Ambulatory Visit (HOSPITAL_COMMUNITY): Payer: 59

## 2010-06-02 ENCOUNTER — Ambulatory Visit (HOSPITAL_COMMUNITY): Payer: 59

## 2010-06-03 NOTE — Progress Notes (Signed)
Summary: [facility] verifying qty on written rx  Phone Note From Pharmacy   Caller: [phone] [facility] Summary of Call: no qty on imdur written rx  Initial call taken by: Glynda Jaeger,  May 24, 2010 2:16 PM  Follow-up for Phone Call        Called and spoke to [employer] regarding qty of medication.  Ruby stated that the information had already been provided to her by pt.  No follow up needed. Follow-up by: Judithe Modest CMA,  May 25, 2010 1:31 PM

## 2010-06-04 ENCOUNTER — Ambulatory Visit (HOSPITAL_COMMUNITY): Payer: 59

## 2010-06-07 ENCOUNTER — Ambulatory Visit (HOSPITAL_COMMUNITY): Payer: 59

## 2010-06-07 NOTE — Discharge Summary (Signed)
  Katherine [name], MARGERUM NO.:  [id number]  MEDICAL RECORD NO.:  [id number]          PATIENT TYPE:  INP  LOCATION:  6527                         FACILITY:  [facility]  PHYSICIAN:  Duke Salvia, MD, FACCDATE OF BIRTH:  [birth year]  DATE OF ADMISSION:  05/13/2010 DATE OF DISCHARGE:  05/15/2010                              DISCHARGE SUMMARY   ADDENDUM  This is an addendum to the previously dictated discharge summary for medications: 1. Norvasc 5 mg daily. 2. Lipitor 20 mg daily. 3. Simvastatin 10 mg was discontinued. 4. Multivitamin daily. 5. Nitroglycerin sublingual p.r.n. 6. Aspirin 325 mg daily. 7. Aspirin 81 mg daily is discontinued. 8. Prasugrel 10 mg daily. 9. Omeprazole 20 mg daily is discontinued. 10.Protonix 40 mg daily. 11.Vitamin C OTC daily. 12.Vitamin D3 OTC daily.     Theodore Demark, PA-C   ______________________________ Duke Salvia, MD, [facility]    RB/MEDQ  D:  05/15/2010  T:  05/16/2010  Job:  [id number]  Electronically Signed by Theodore Demark PA-C on 05/19/2010 02:57:17 PM Electronically Signed by Sherryl Manges MD [facility] on 06/07/2010 09:53:01 PM

## 2010-06-07 NOTE — Discharge Summary (Signed)
Katherine, [name] NO.:  [id number]  MEDICAL RECORD NO.:  [id number]          PATIENT TYPE:  INP  LOCATION:  6527                         FACILITY:  [facility]  PHYSICIAN:  Duke Salvia, MD, FACCDATE OF BIRTH:  16-Jun-1945  DATE OF ADMISSION:  05/13/2010 DATE OF DISCHARGE:  05/15/2010                              DISCHARGE SUMMARY   PROCEDURES: 1. Cardiac catheterization. 2. Coronary arteriogram. 3. Left ventriculogram. 4. Percutaneous transluminal coronary angioplasty and Promus drug-     eluting stent, 2.75 x 28 and 3.0 x 8 mm to the right coronary     artery.  PRIMARY FINAL DISCHARGE DIAGNOSIS:  Progressive/unstable anginal pain.  SECONDARY DIAGNOSES: 1. Residual coronary artery disease in the circumflex/obtuse marginal     at 70% and left anterior descending at 30%. 2. Preserved left ventricular function with vigorous global systolic     function and no segmental wall motion abnormalities. 3. Left bundle branch block. 4. Hypertension. 5. Hyperlipidemia with an HDL of 42 and an LDL of 157 earlier in     January 2012. 6. History of monoclonal gammopathy of uncertain significance,     previously evaluated by Hematology. 7. 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 8. Status post tonsillectomy. 9. Allergy or intolerance to SULFA, HIGH-DOSES of CRESTOR and ZOCOR as     well as ZETIA.  TIME AT DISCHARGE:  42 minutes.  HOSPITAL COURSE:  Katherine [name] is a 65 year old female with no previous history of coronary artery disease.  She had chest pain and was evaluated with a stress test which was negative.  She had recurrent symptoms in [name] office and was admitted for further evaluation.  Cardiac catheterization was recommended because of the progressive nature of her symptoms.  The cardiac catheterization showed a 30% LAD, 70-75% in the OM after intracoronary nitroglycerin, and a 90% as well as a 70% RCA.  The RCA lesions were treated  with 2 overlapping drug-eluting stents reducing the stenosis to 0.  Her EF was normal.  Katherine [name] had an episode of pain in her upper abdomen postprocedure that was relieved by Maalox.  She was seen by Cardiac Rehab and continued smoking cessation was encouraged as well as exercise and a heart-healthy lifestyle.  On May 15, 2010, Katherine [name]'s labs were stable.  Her P2Y12 test showed 15% inhibition/267 platelets.  The Zocor limit is 10 mg when on Norvasc and she will likely be changed to either Crestor or Lipitor. She had been tolerating Zocor 20 mg daily pretty well.  She had some sinus bradycardia with heart rates in the low 50s while asleep.  She is not on a beta-blocker.  Katherine [name] is considered stable for discharge on May 15, 2010, to follow up as an outpatient.  DISCHARGE INSTRUCTIONS: 1. Her activity level is to be increased gradually per cardiac rehab     instructions. 2. She is to call our office for problems with the cath site. 3. She is to follow up with Dr. Patty Sermons and an appointment will bearranged. 4. She is to follow up with Dr. Patty Sermons  and our office will call her     with an appointment.  DISCHARGE MEDICATIONS:  Will be discussed separately.     Theodore Demark, PA-C   ______________________________ Duke Salvia, MD, [facility]    RB/MEDQ  D:  05/15/2010  T:  05/16/2010  Job:  [id number]  Electronically Signed by Theodore Demark PA-C on 05/19/2010 02:57:07 PM Electronically Signed by Sherryl Manges MD [facility] on 06/07/2010 09:52:56 PM

## 2010-06-08 NOTE — H&P (Signed)
[name], [name] NO.:  [id number]  MEDICAL RECORD NO.:  [id number]           PATIENT TYPE:  LOCATION:                                 FACILITY:  PHYSICIAN:  Colleen Can. Deborah Chalk, M.D.DATE OF BIRTH:  04-Mar-1946  DATE OF ADMISSION: DATE OF DISCHARGE:                             HISTORY & PHYSICAL   Admitted to [name] service.  HISTORY:  [name] is a 65 year old female who has been having recurrent episodes of substernal chest pain.  She has had a total of 4-5 episodes with the latest occurring early this morning after she ate breakfast. She had her first episode on March 25, 2010, and then had an episode in early January 2012 and then had another severe episode and was seen and hospitalized on May 06, 2010.  She has an underlying left bundle- branch block, as well as a history of hypertension, hyperlipidemia, and a family history of heart disease and a history of tobacco abuse up until recently.  Because of the persistence and recurrence of the chest pain, she had a stress Cardiolite scheduled and had that done on May 11, 2010.  She had a negative result from that study but then today, she had recurrence of chest discomfort again approximately 30-45 minutes after she ate a rather bland breakfast.  It was an anterior chest discomfort.  It basically resolved as she came in for evaluation in today, and arrangements were being made for her to have an outpatient catheterization when she again had severe substernal chest discomfort in the lab waiting for her blood to be drawn, and she is subsequently admitted for evaluation.  PAST MEDICAL HISTORY:  Remarkable for a history of essential hypertension and iron-deficiency anemia.  She was hospitalized earlier in 2010 with lobar pneumonia.  She has been a former smoker.  She has had tingling of both of her hands, predominantly the right hand in the past, as well as a history of floaters in her  eyes.  She has seen Dr. [name] for dysproteinemia from monoclonal gammopathy of uncertain etiology.  She has a known left bundle-branch block.  She has had negative stress Cardiolite studies with normal ejection fraction in the past, and she had a remote cardiac catheterization dating back to 1994 which was totally normal.  She has been a heavy smoker in the past.  She has had elevated lipids but has been able to tolerate lipid-lowering medications and is currently taking Zocor 20 mg per day.  She has had a history of a right carotid bruit.  She has a known murmur of mild aortic stenosis.  REVIEW OF SYSTEMS:  Otherwise, shows no fever, night sweats, and no real history of palpitations.  She does not have any trouble swallowing food. She has been on Protonix over the past week and without any real significant change in her symptoms.  She has not smoked over the past week.  She is not under any particular abnormal stress.  FAMILY HISTORY:  Her father died at age 1 of cancer and heart disease, mother died at age 16 of suicide but  had a history of an enlarged heart.  SOCIAL HISTORY:  She is a former smoker.  No history of alcohol.  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  Her husband is retired as well.  CURRENT MEDICATIONS: 1. Aspirin 81 mg a day. 2. Protonix 20 mg day. 3. Zocor 20 mg a day. 4. Amlodipine 5 mg a day. 5. Multivitamin.  PHYSICAL EXAMINATION:  GENERAL:  She is a pleasant but somewhat anxious 65 year old female. VITAL SIGNS:  Weight 133, blood pressure is 140/60 sitting, heart rate 60 and regular. HEENT:  She is normocephalic and atraumatic.  Pupils equally round and reactive to light.  Sclerae are unremarkable.  Oropharynx clear. NECK:  Supple without bruits.  There is a soft systolic outflow murmur. It has regular rate and rhythm. CHEST WALL:  Anterior chest wall is nontender. LUNGS:  Clear. HEART:  Regular rate and rhythm. ABDOMEN:  Soft,  nontender. EXTREMITIES:  Without edema.  Peripheral pulses are intact. NEUROLOGIC:  She is intact.  Her EKG in the office today shows sinus with a left bundle-branch block. Followup EKG shows anterior T-wave inversion with chest pain.  OVERALL IMPRESSION: 1. Recurrent anterior substernal chest pain. 2. Left bundle-branch block. 3. Recent normal stress Cardiolite study. 4. Hypercholesterolemia. 5. Essential hypertension. 6. History of recent cigarette abuse. 7. History of monoclonal gammopathy of uncertain significance.  PLAN:  In light of the recurrence of the chest discomfort in spite of a negative stress Cardiolite study, she will be referred for cardiac catheterization, coronary arteriograms.  There is a somewhat loose connection of eating associated with this discomfort, and if her cardiac catheterization is negative, she will need to have a GI evaluation.     Colleen Can. Deborah Chalk, M.D.     SNT/MEDQ  D:  05/13/2010  T:  05/14/2010  Job:  [id number]  Electronically Signed by Roger Shelter M.D. on 06/08/2010 09:12:46 AM

## 2010-06-09 ENCOUNTER — Ambulatory Visit (HOSPITAL_COMMUNITY): Payer: 59

## 2010-06-09 NOTE — Procedures (Signed)
NAMEJEANNENE, TSCHETTER NO.:  [id number]  MEDICAL RECORD NO.:  [id number]           PATIENT TYPE:  LOCATION:                                 FACILITY:  PHYSICIAN:  Arturo Morton. Riley Kill, MD, FACCDATE OF BIRTH:  09-15-1945  DATE OF PROCEDURE:  05/14/2010 DATE OF DISCHARGE:                           CARDIAC CATHETERIZATION   INDICATIONS:  This patient is a very pleasant 65 year old woman who has a long history of smoking.  She has been having chest pain recently but had a negative nuclear scan.  She continued to have chest pain and was brought in for further evaluation.  Risks, benefits, and alternatives were discussed and she was brought to the laboratory for further evaluation.  PROCEDURE: 1. Left heart catheterization. 2. Selective coronary arteriography. 3. Selective left ventriculography. 4. Percutaneous stenting of the right coronary artery.  DESCRIPTION OF PROCEDURE:  We initially planned to use the right radial artery but she has a ganglion cyst over this area.  We selected the right femoral artery.  A 4-French catheters were used.  After this I spoke with the patient, her husband, and also Dr. Patty Sermons.  The patient has T-wave inversion in the anterior precordial leads, but intermittent left bundle-branch block has been noted.  She was noted to have bundle-branch block yesterday.  Catheterization findings included a highly tortuous circumflex coronary artery with about a 70% area of segmental narrowing and a moderately small-caliber vessel.  The right coronary artery had a very high-grade lesion in the midportion, and the distal right coronary artery was extremely large.  Based upon the findings and after discussion with Dr. Patty Sermons, we elected to recommend percutaneous intervention.  I spoke with her husband and reviewed the films with him, brought her back into the room, and we discussed it again with the patient.  She consented to proceed.   With this, a JR-4 guiding catheter was utilized, 600 mg of oral clopidogrel was administered.  She previously had aspirin.  We were able to cross with a guidewire, and predilatation was done with a 2.25-mm balloon. Stenting was accomplished using a 28 x 2.75 Promus Element stent taken to approximately 14 atmospheres.  I elected to place a second stent in telescoping fashion because of the small area of continued plaque just proximal to the length of the stent.  A 3.0 x 8 stent was then placed in overlap fashion into the proximal portion of the previously placed stent in the midvessel.  This was deployed to 12-13 atmospheres.  Following this, postdilatation was done with a 3.25 Quantum apex with pressures up to 16 atmospheres.  There appeared to be good stent expansion, no edge tear, and a good angiographic result.  Following this, all catheters were subsequently removed and the femoral sheath was sewn into place. Bivalirudin was utilized as the anticoagulation, with an appropriate ACT.  Oral clopidogrel 600 mg was administered at the beginning.  There were no major complications.  HEMODYNAMIC DATA: 1. Central aortic pressure 142/61. 2. Left ventricular pressure 132/2. 3. There was no gradient or pullback across the aortic valve.  ANGIOGRAPHIC DATA: 1.  Ventriculography done in the RAO projection revealed vigorous     global systolic function without a segmental wall motion     abnormality. 2. The left main is free of critical disease. 3. The LAD has mild luminal irregularity in several locations.  There     is about 30% narrowing just after the takeoff of the major     diagonal.  The major diagonal itself is a bifurcating vessel     without critical disease. 4. The circumflex has a small ramus intermedius and then there is a     long left main leading into the circumflex, and courses     posteriorly.  It then provides a marginal branch that is fairly     tortuous with bands before  and after the lesion.  There is 70% to     75% stenosis noted particularly after giving intracoronary     nitroglycerin.  The distal vessel was open.  The AV circumflex is     without critical disease. 5. The right coronary artery has an area of diffuse plaque in the     proximal mid junction of about 70% followed by a long 80% to 90%     area of mid stenosis.  There is calcification in these areas.     Distally, there is mild luminal irregularity in large distribution     right coronary with the PDA and posterolateral system.  Following     stenting, these were reduced to 0% residual luminal narrowing with     the overlapping stents.  CONCLUSION: 1. Well-preserved left ventricular function. 2. Segmental narrowing in a highly tortuous smaller caliber circumflex     is noted above. 3. High-grade stenosis of the mid right coronary artery with     successful percutaneous stenting using overlapping drug-eluting     stents.  DISPOSITION:  The patient will require dual antiplatelet therapy for approximately 1 year minimum, discontinuation of smoking is highly advised.  With regard to the circumflex, the plan would be to follow her closely.  If she has progressive symptoms, this could be approached, but would be of increased risk due to the tortuosity.     Arturo Morton. Riley Kill, MD, [facility]     TDS/MEDQ  D:  05/14/2010  T:  05/15/2010  Job:  [id number]  cc:   Cassell Clement, M.D. [facility]. Colleen Can. Deborah Chalk, M.D.  Electronically Signed by Shawnie Pons MD [facility] on 06/09/2010 09:08:14 PM

## 2010-06-11 ENCOUNTER — Ambulatory Visit (HOSPITAL_COMMUNITY): Payer: 59

## 2010-06-14 ENCOUNTER — Ambulatory Visit (HOSPITAL_COMMUNITY): Payer: 59

## 2010-06-16 ENCOUNTER — Ambulatory Visit (HOSPITAL_COMMUNITY): Payer: 59

## 2010-06-17 ENCOUNTER — Emergency Department (HOSPITAL_COMMUNITY): Payer: 59

## 2010-06-17 ENCOUNTER — Observation Stay (HOSPITAL_COMMUNITY)
Admission: EM | Admit: 2010-06-17 | Discharge: 2010-06-18 | DRG: 287 | Disposition: A | Discharge status: Home / Self Care | Payer: 59 | Attending: Cardiology | Admitting: Cardiology

## 2010-06-17 DIAGNOSIS — I447 Left bundle-branch block, unspecified: Secondary | ICD-10-CM | POA: Insufficient documentation

## 2010-06-17 DIAGNOSIS — R079 Chest pain, unspecified: Secondary | ICD-10-CM | POA: Insufficient documentation

## 2010-06-17 DIAGNOSIS — I10 Essential (primary) hypertension: Secondary | ICD-10-CM | POA: Insufficient documentation

## 2010-06-17 DIAGNOSIS — E785 Hyperlipidemia, unspecified: Secondary | ICD-10-CM | POA: Insufficient documentation

## 2010-06-17 DIAGNOSIS — I2510 Atherosclerotic heart disease of native coronary artery without angina pectoris: Principal | ICD-10-CM | POA: Insufficient documentation

## 2010-06-17 LAB — CBC
HCT: 31.6 % — ABNORMAL LOW (ref 36.0–46.0)
Hemoglobin: 10.3 g/dL — ABNORMAL LOW (ref 12.0–15.0)
MCH: 31 pg (ref 26.0–34.0)
MCHC: 32.6 g/dL (ref 30.0–36.0)
RDW: 14 % (ref 11.5–15.5)

## 2010-06-17 LAB — DIFFERENTIAL
Basophils Relative: 1 % (ref 0–1)
Eosinophils Relative: 3 % (ref 0–5)
Lymphocytes Relative: 48 % — ABNORMAL HIGH (ref 12–46)
Monocytes Absolute: 0.5 10*3/uL (ref 0.1–1.0)
Monocytes Relative: 9 % (ref 3–12)
Neutro Abs: 2.2 10*3/uL (ref 1.7–7.7)

## 2010-06-18 ENCOUNTER — Ambulatory Visit (HOSPITAL_COMMUNITY): Payer: 59

## 2010-06-18 DIAGNOSIS — R079 Chest pain, unspecified: Secondary | ICD-10-CM

## 2010-06-18 DIAGNOSIS — I2510 Atherosclerotic heart disease of native coronary artery without angina pectoris: Secondary | ICD-10-CM

## 2010-06-18 LAB — CARDIAC PANEL(CRET KIN+CKTOT+MB+TROPI)
CK, MB: 2.5 ng/mL (ref 0.3–4.0)
CK, MB: 2.5 ng/mL (ref 0.3–4.0)
Relative Index: INVALID (ref 0.0–2.5)
Relative Index: 2.3 (ref 0.0–2.5)
Total CK: 98 U/L (ref 7–177)
Total CK: 107 U/L (ref 7–177)
Troponin I: 0.01 ng/mL (ref 0.00–0.06)

## 2010-06-18 LAB — COMPREHENSIVE METABOLIC PANEL
ALT: 15 U/L (ref 0–35)
AST: 23 U/L (ref 0–37)
CO2: 27 mEq/L (ref 19–32)
Calcium: 8.8 mg/dL (ref 8.4–10.5)
Chloride: 107 mEq/L (ref 96–112)
GFR calc Af Amer: >60 mL/min (ref >60)
GFR calc non Af Amer: >60 mL/min (ref >60)
Sodium: 142 mEq/L (ref 135–145)

## 2010-06-18 LAB — TROPONIN I: Troponin I: 0.01 ng/mL (ref 0.00–0.06)

## 2010-06-18 LAB — POCT CARDIAC MARKERS
CKMB, poc: 1.9 ng/mL (ref 1.0–8.0)
Myoglobin, poc: 63.4 ng/mL (ref 12–200)
Troponin i, poc: <0.05 ng/mL (ref 0.00–0.09)

## 2010-06-18 LAB — LIPID PANEL: Triglycerides: 71 mg/dL (ref <150)

## 2010-06-21 ENCOUNTER — Ambulatory Visit (HOSPITAL_COMMUNITY): Payer: 59

## 2010-06-23 ENCOUNTER — Ambulatory Visit (HOSPITAL_COMMUNITY): Payer: 59

## 2010-06-23 ENCOUNTER — Ambulatory Visit: Payer: Self-pay | Admitting: Cardiology

## 2010-06-25 ENCOUNTER — Ambulatory Visit (HOSPITAL_COMMUNITY): Payer: 59

## 2010-06-28 ENCOUNTER — Ambulatory Visit (HOSPITAL_COMMUNITY): Payer: 59

## 2010-06-30 ENCOUNTER — Ambulatory Visit (HOSPITAL_COMMUNITY): Payer: 59

## 2010-07-01 ENCOUNTER — Institutional Professional Consult (permissible substitution) (INDEPENDENT_AMBULATORY_CARE_PROVIDER_SITE_OTHER): Payer: 59 | Admitting: Cardiology

## 2010-07-01 DIAGNOSIS — R079 Chest pain, unspecified: Secondary | ICD-10-CM

## 2010-07-01 DIAGNOSIS — I2510 Atherosclerotic heart disease of native coronary artery without angina pectoris: Secondary | ICD-10-CM

## 2010-07-01 DIAGNOSIS — Z79899 Other long term (current) drug therapy: Secondary | ICD-10-CM

## 2010-07-01 DIAGNOSIS — E7800 Pure hypercholesterolemia, unspecified: Secondary | ICD-10-CM

## 2010-07-01 NOTE — Procedures (Signed)
  NAMERAINBOW, Katherine [name] NO.:  [id number]  MEDICAL RECORD NO.:  [id number]           PATIENT TYPE:  I  LOCATION:  2023                         FACILITY:  [facility]  PHYSICIAN:  Rollene Rotunda, MD, FACCDATE OF BIRTH:  February 20, 1946  DATE OF PROCEDURE:  06/18/2010 DATE OF DISCHARGE:  06/18/2010                           CARDIAC CATHETERIZATION   PRIMARY:  Cassell Clement, MD  PROCEDURE NOTE:  Left heart catheterization performed in the right femoral artery.  The artery was cannulated using the anterior wall puncture.  A #5-French arterial sheath was inserted via the right Seldinger technique.  Preformed Judkins and pigtail catheter were utilized.  The patient tolerated the procedure well and left the lab in stable condition.  RESULTS:  Hemodynamics LV 140/4, AO 148/76.  Coronaries, left main was normal.  The LAD had proximal 25% stenosis.  There was a diagonal which was very large with luminal irregularities.  The circumflex in the AV groove was normal.  There was a large mid obtuse marginal with long proximal to mid 50-60% stenosis.  This was unchanged from previous.  The right coronary artery is a large dominant vessel.  There was a proximal stent with some in-stent luminal irregularities but was otherwise widely patent.  PDA was large and normal.  Left ventriculogram, the left ventriculogram was obtained in the RAO projection.  The EF was 65% with normal wall motion.  CONCLUSION:  Patent right coronary artery stent.  Residual nonobstructive disease elsewhere.  PLAN:  Continue aggressive medical management.     Rollene Rotunda, MD, [facility]     JH/MEDQ  D:  06/18/2010  T:  06/19/2010  Job:  [id number]  cc:   Cassell Clement, M.D.  Electronically Signed by Rollene Rotunda MD [facility] on 07/01/2010 07:59:40 PM

## 2010-07-02 ENCOUNTER — Ambulatory Visit (HOSPITAL_COMMUNITY): Payer: 59

## 2010-07-05 ENCOUNTER — Ambulatory Visit (HOSPITAL_COMMUNITY): Payer: 59

## 2010-07-07 ENCOUNTER — Ambulatory Visit (HOSPITAL_COMMUNITY): Payer: 59

## 2010-07-07 NOTE — Discharge Summary (Signed)
NAMECAITLYN, BUCHANAN NO.:  [id number]  MEDICAL RECORD NO.:  [id number]           PATIENT TYPE:  I  LOCATION:  2023                         FACILITY:  [facility]  PHYSICIAN:  Colleen Can. Deborah Chalk, M.D.DATE OF BIRTH:  1946-03-04  DATE OF ADMISSION:  06/17/2010 DATE OF DISCHARGE:  06/18/2010                              DISCHARGE SUMMARY   PRIMARY CARDIOLOGIST:  Cassell Clement, MD  DISCHARGE DIAGNOSIS:  Chest pain without objective evidence of ischemia.  SECONDARY DIAGNOSES: 1. Coronary artery disease status post prior drug-eluting stent     placement to the right coronary artery in January 2012 with     nonobstructive disease at this admission. 2. Hypertension. 3. Hyperlipidemia. 4. History of sinus bradycardia preventing beta-blocker usage. 5. Left bundle-branch block. 6. History of monoclonal myopathy of uncertain significance,     previously seen by Hematology. 7. History of poor reactivity to Plavix.  ALLERGIES:  SULFA, HIGH DOSES OF CRESTOR, ZOCOR, and ZETIA.  PROCEDURE:  Left heart cardiac catheterization performed on June 18, 2010 revealing patency of previously placed right coronary artery stent with nonobstructive disease including 50% to 60% stenosis in a long proximal to mid obtuse marginal.  EF was 65%.  HISTORY OF PRESENT ILLNESS:  A 65 year old female with prior history of coronary artery disease status post drug-eluting stent placement to the right coronary artery in January of this year.  At that time she was also noted to have a nonobstructive disease in a small tortuous left circumflex.  This was medically managed and the patient was discharged home on January 29, on aspirin, prasugrel, and statin therapy. Unfortunately, she has been having intermittent chest discomfort occurring 3-4 times per week, usually associated with her evening meal. She denied any exertional chest pain.  Because of continuing symptoms, presented to the [facility]  ED on March 1, where ECG showed no acute changes and point-of-care markers were negative.  She was admitted for evaluation.  HOSPITAL COURSE:  The patient ruled out for MI.  Because of recent stenting with known small vessel disease, decision was made to pursue repeat catheterization.  This was performed earlier today showing patency of the right coronary artery stent, otherwise nonobstructive disease.  Continue medical therapy and has been recommended to discharge.  The patient has been ambulating post catheterization and will be discharged home today in good condition.  DISCHARGE LABS:  Hemoglobin 10.3, hematocrit 31.6, WBC 5.7, platelets 287, and INR 0.97.  Sodium 142, potassium 3.7, chloride 107, CO2 of 27, BUN 16, creatinine 0.83, glucose 124, total bilirubin 0.3, alkaline phosphatase 58, AST 23, ALT 15, total protein 6.5, albumin 3.5, calcium 8.8, CK 107, MB 2.5, troponin-I 0.01, total cholesterol 164, triglycerides 71, HDL 46, and LDL 94.  DISPOSITION:  The patient will be discharged home today in goodcondition.  FOLLOWUP PLANS AND APPOINTMENTS:  The patient will follow up Dr. Cassell Clement on March 15 at 11:15 a.m.  DISCHARGE MEDICATIONS: 1. Aspirin 325 mg daily. 2. Amlodipine 5 mg daily. 3. Lipitor 20 mg daily. 4. Multivitamin daily. 5. Nitroglycerin 0.4 mg sublingual p.r.n. chest pain. 6. Protonix 40  mg daily. 7. Prasugrel 10 mg daily. 8. Vitamin C over-the-counter daily. 9. Vitamin D3 over-the-counter daily. 10.Imdur 30 mg daily.  OUTSTANDING LAB STUDIES:  None.  Duration of discharge encounter 60 minutes including physician time.     Nicolasa Ducking, ANP   ______________________________ Colleen Can. Deborah Chalk, M.D.    CB/MEDQ  D:  06/18/2010  T:  06/19/2010  Job:  [id number]  Electronically Signed by Nicolasa Ducking ANP on 06/22/2010 04:24:40 PM Electronically Signed by Roger Shelter M.D. on 07/07/2010 04:16:04 PM

## 2010-07-09 ENCOUNTER — Ambulatory Visit (HOSPITAL_COMMUNITY): Payer: 59

## 2010-07-12 ENCOUNTER — Ambulatory Visit (HOSPITAL_COMMUNITY): Payer: 59

## 2010-07-14 ENCOUNTER — Ambulatory Visit (HOSPITAL_COMMUNITY): Payer: 59

## 2010-07-16 ENCOUNTER — Telehealth: Payer: Self-pay | Admitting: Cardiology

## 2010-07-16 ENCOUNTER — Ambulatory Visit (HOSPITAL_COMMUNITY): Payer: 59

## 2010-07-16 NOTE — Telephone Encounter (Signed)
Leave off Effient and ASA for one day.

## 2010-07-16 NOTE — Telephone Encounter (Signed)
CALL PT ABOUT HER MEDS AND A NOSE BLEED. PLACED CHART IN YOUR BOX.

## 2010-07-16 NOTE — Telephone Encounter (Signed)
Please advise 

## 2010-07-16 NOTE — Telephone Encounter (Signed)
Called patient to advise and she stated she had nose bleed yesterday.  She had already taken effient, but no aspirin.  Discussed with Dr Patty Sermons and she will hold aspirin today and tomorrow and continue effient.  Call back if any more problems.

## 2010-07-19 ENCOUNTER — Ambulatory Visit (HOSPITAL_COMMUNITY): Payer: 59

## 2010-07-21 ENCOUNTER — Ambulatory Visit (HOSPITAL_COMMUNITY): Payer: 59

## 2010-07-21 LAB — DIFFERENTIAL
Basophils Absolute: 0 10*3/uL (ref 0.0–0.1)
Eosinophils Relative: 1 % (ref 0–5)
Lymphocytes Relative: 7 % — ABNORMAL LOW (ref 12–46)
Monocytes Absolute: 0.5 10*3/uL (ref 0.1–1.0)
Monocytes Relative: 5 % (ref 3–12)
Neutro Abs: 8.4 10*3/uL — ABNORMAL HIGH (ref 1.7–7.7)

## 2010-07-21 LAB — CBC
Platelets: 298 10*3/uL (ref 150–400)
RDW: 13.3 % (ref 11.5–15.5)
WBC: 9.6 10*3/uL (ref 4.0–10.5)

## 2010-07-21 LAB — COMPREHENSIVE METABOLIC PANEL
AST: 22 U/L (ref 0–37)
Albumin: 3.5 g/dL (ref 3.5–5.2)
Alkaline Phosphatase: 63 U/L (ref 39–117)
Chloride: 108 mEq/L (ref 96–112)
Creatinine, Ser: 0.8 mg/dL (ref 0.4–1.2)
GFR calc Af Amer: >60 mL/min (ref >60)
Potassium: 3.7 mEq/L (ref 3.5–5.1)
Total Bilirubin: 0.7 mg/dL (ref 0.3–1.2)
Total Protein: 7 g/dL (ref 6.0–8.3)

## 2010-07-21 LAB — LIPID PANEL
Cholesterol: 183 mg/dL (ref 0–200)
HDL: 35 mg/dL — ABNORMAL LOW (ref >39)
LDL Cholesterol: 131 mg/dL — ABNORMAL HIGH (ref 0–99)
Triglycerides: 84 mg/dL (ref <150)

## 2010-07-21 LAB — CK TOTAL AND CKMB (NOT AT ARMC)
CK, MB: 1.8 ng/mL (ref 0.3–4.0)
Total CK: 74 U/L (ref 7–177)

## 2010-07-21 LAB — URINE CULTURE: Colony Count: 9000

## 2010-07-21 LAB — POCT CARDIAC MARKERS
CKMB, poc: 1.3 ng/mL (ref 1.0–8.0)
Troponin i, poc: <0.05 ng/mL (ref 0.00–0.09)

## 2010-07-21 LAB — URINALYSIS, ROUTINE W REFLEX MICROSCOPIC
Ketones, ur: NEGATIVE mg/dL
Nitrite: NEGATIVE
Protein, ur: NEGATIVE mg/dL
Urobilinogen, UA: 0.2 mg/dL (ref 0.0–1.0)
pH: 5 (ref 5.0–8.0)

## 2010-07-21 LAB — CARDIAC PANEL(CRET KIN+CKTOT+MB+TROPI)
CK, MB: 1.7 ng/mL (ref 0.3–4.0)
Total CK: 63 U/L (ref 7–177)

## 2010-07-21 LAB — PROTIME-INR: Prothrombin Time: 12.7 seconds (ref 11.6–15.2)

## 2010-07-21 LAB — TSH: TSH: 1.539 u[IU]/mL (ref 0.350–4.500)

## 2010-07-23 ENCOUNTER — Ambulatory Visit (HOSPITAL_COMMUNITY): Payer: 59

## 2010-07-26 ENCOUNTER — Encounter: Payer: Self-pay | Admitting: Cardiology

## 2010-07-26 ENCOUNTER — Ambulatory Visit (HOSPITAL_COMMUNITY): Payer: 59

## 2010-07-26 DIAGNOSIS — R040 Epistaxis: Secondary | ICD-10-CM | POA: Insufficient documentation

## 2010-07-26 DIAGNOSIS — I447 Left bundle-branch block, unspecified: Secondary | ICD-10-CM | POA: Insufficient documentation

## 2010-07-26 DIAGNOSIS — I209 Angina pectoris, unspecified: Secondary | ICD-10-CM | POA: Insufficient documentation

## 2010-07-26 DIAGNOSIS — I119 Hypertensive heart disease without heart failure: Secondary | ICD-10-CM | POA: Insufficient documentation

## 2010-07-26 DIAGNOSIS — Z720 Tobacco use: Secondary | ICD-10-CM | POA: Insufficient documentation

## 2010-07-26 DIAGNOSIS — Z8639 Personal history of other endocrine, nutritional and metabolic disease: Secondary | ICD-10-CM | POA: Insufficient documentation

## 2010-07-26 DIAGNOSIS — I2510 Atherosclerotic heart disease of native coronary artery without angina pectoris: Secondary | ICD-10-CM | POA: Insufficient documentation

## 2010-07-27 ENCOUNTER — Ambulatory Visit (INDEPENDENT_AMBULATORY_CARE_PROVIDER_SITE_OTHER): Payer: 59 | Admitting: Cardiology

## 2010-07-27 ENCOUNTER — Encounter: Payer: Self-pay | Admitting: Cardiology

## 2010-07-27 DIAGNOSIS — Z720 Tobacco use: Secondary | ICD-10-CM

## 2010-07-27 DIAGNOSIS — Z862 Personal history of diseases of the blood and blood-forming organs and certain disorders involving the immune mechanism: Secondary | ICD-10-CM

## 2010-07-27 DIAGNOSIS — R040 Epistaxis: Secondary | ICD-10-CM

## 2010-07-27 DIAGNOSIS — I209 Angina pectoris, unspecified: Secondary | ICD-10-CM

## 2010-07-27 DIAGNOSIS — F17200 Nicotine dependence, unspecified, uncomplicated: Secondary | ICD-10-CM

## 2010-07-27 DIAGNOSIS — Z8639 Personal history of other endocrine, nutritional and metabolic disease: Secondary | ICD-10-CM

## 2010-07-27 MED ORDER — ASPIRIN 81 MG PO TABS: 81.0000 mg | ORAL_TABLET | ORAL | Status: DC

## 2010-07-27 NOTE — Assessment & Plan Note (Signed)
Is watching her diet carefully in terms of her cholesterol.

## 2010-07-27 NOTE — Assessment & Plan Note (Signed)
The patient has not had to take any recent sublingual nitroglycerin.  She is having very infrequent Chest pain.

## 2010-07-27 NOTE — Assessment & Plan Note (Signed)
She remains a nonsmoker although her husband continues to smoke.

## 2010-07-27 NOTE — Assessment & Plan Note (Signed)
The patient has had some epistaxis and is also had easy bruising and spontaneous bruising.  She is on Effient daily and also taking aspirin 81 mg daily.  Because of the bruising we are going to decrease the aspirin to just every other day.  Continue Effient every day.

## 2010-07-27 NOTE — Progress Notes (Signed)
History of Present Illness: This 65 year old married Caucasian female is seen for followup office visit.  She has a history of known coronary artery disease.  She had cardiac catheterization by Dr. Riley Kill on 05/14/10.  She had a very high-grade lesion in the midportion of the right coronary artery which was treated with 2 overlapping drug-eluting stents.  She also had evidence of vasospasm drainage catheter.  She was readmitted in March for chest pain and had a followup cardiac catheterization on 06/18/10 which showed that the right coronary artery stent was patent and she had residual nonobstructive disease elsewhere.  She's had a history of intermittent left bundle-branch block hypercholesterolemia past history of cigarette abuse and history of essential hypertension.  Current Outpatient Prescriptions  Medication Sig Dispense Refill  . amLODipine (NORVASC) 5 MG tablet Take 5 mg by mouth daily.        . Ascorbic Acid (VITAMIN C) 500 MG tablet Take 250 mg by mouth daily.       [name] aspirin 81 MG tablet Take 1 tablet (81 mg total) by mouth every other day.  100 tablet  3  . atorvastatin (LIPITOR) 20 MG tablet Take 20 mg by mouth daily.        . Cholecalciferol (VITAMIN D-3 PO) Take 1,000 mg by mouth daily.        . Multiple Vitamin (MULTIVITAMIN) tablet Take 1 tablet by mouth daily.        . nitroGLYCERIN (NITROSTAT) 0.4 MG SL tablet Place 0.4 mg under the tongue every 5 (five) minutes as needed.        . pantoprazole (PROTONIX) 40 MG tablet Take 40 mg by mouth daily.        . prasugrel (EFFIENT) 10 MG TABS Take 10 mg by mouth daily.        [name] DISCONTD: aspirin 81 MG tablet Take 81 mg by mouth daily.          Allergies  Allergen Reactions  . Ezetimibe   . Isosorbide Swelling    SWELLING OF THE TONGUE  . Sulfonamide Derivatives     Patient Active Problem List  Diagnoses  . Anginal pain  . Coronary artery disease  . Nosebleed  . Hypertension  . History Of Hypercholesterolemia  . Left  bundle-branch block  . Tobacco abuse  . Ischemic heart disease    History  Smoking status  . Former Smoker  . Quit date: 05/05/2010  Smokeless tobacco  . Not on file    History  Alcohol Use No    Family History  Problem Relation Age of Onset  . Cancer Father   . Heart disease Father     Review of Systems: Constitutional: no fever chills diaphoresis or fatigue or change in weight.  Head and neck: no hearing loss, no epistaxis, no photophobia or visual disturbance. Respiratory: No cough, shortness of breath or wheezing. Cardiovascular: No chest pain peripheral edema, palpitations. Gastrointestinal: No abdominal distention, no abdominal pain, no change in bowel habits hematochezia or melena. Genitourinary: No dysuria, no frequency, no urgency, no nocturia. Musculoskeletal:No arthralgias, no back pain, no gait disturbance or myalgias. Neurological: No dizziness, no headaches, no numbness, no seizures, no syncope, no weakness, no tremors. Hematologic: No lymphadenopathy, Psychiatric: No confusion, no hallucinations, no sleep disturbance.    Physical Exam: Filed Vitals:   07/27/10 0956  BP: 130/78  Pulse: 70  Weight 136.  The general appearance feels a well-developed well-nourished woman in no distress.Pupils equal and reactive.   Extraocular Movements  are full.  There is no scleral icterus.  The mouth and pharynx are normal.  The neck is supple.  The carotids reveal no bruits.  The jugular venous pressure is normal.  The thyroid is not enlarged.  There is no lymphadenopathy.The chest is clear to percussion and auscultation. There are no rales or rhonchi. Expansion of the chest is symmetrical.The precordium is quiet.  The first heart sound is normal.  The second heart sound is physiologically split.  There is no  gallop rub or click.  There is no abnormal lift or heave.She has a soft systolic ejection murmur at the base.The abdomen is soft and nontender. Bowel sounds are normal.  The liver and spleen are not enlarged. There Are no abdominal masses. There are no bruits.The pedal pulses are good.  There is no phlebitis or edema.  There is no cyanosis or clubbing.Strength is normal and symmetrical in all extremities.  There is no lateralizing weakness.  There are no sensory deficits.   Assessment / Plan: Continue same medication.  Decrease aspirin to every other day because of bruising.  She's having a problem with her nails and I suggested biotin.  Recheck in 3 months for followup office visit CBC lipid panel chemistries and EKG

## 2010-07-28 ENCOUNTER — Ambulatory Visit (HOSPITAL_COMMUNITY): Payer: 59

## 2010-07-29 LAB — COMPREHENSIVE METABOLIC PANEL
ALT: 14 U/L (ref 0–35)
ALT: 18 U/L (ref 0–35)
AST: 29 U/L (ref 0–37)
Albumin: 1.6 g/dL — ABNORMAL LOW (ref 3.5–5.2)
Albumin: 2 g/dL — ABNORMAL LOW (ref 3.5–5.2)
Alkaline Phosphatase: 58 U/L (ref 39–117)
BUN: 5 mg/dL — ABNORMAL LOW (ref 6–23)
CO2: 28 mEq/L (ref 19–32)
Chloride: 106 mEq/L (ref 96–112)
Chloride: 99 mEq/L (ref 96–112)
Creatinine, Ser: 0.62 mg/dL (ref 0.4–1.2)
GFR calc Af Amer: >60 mL/min (ref >60)
GFR calc non Af Amer: >60 mL/min (ref >60)
Glucose, Bld: 96 mg/dL (ref 70–99)
Potassium: 3.9 mEq/L (ref 3.5–5.1)
Potassium: 4 mEq/L (ref 3.5–5.1)
Sodium: 134 mEq/L — ABNORMAL LOW (ref 135–145)
Sodium: 136 mEq/L (ref 135–145)
Total Bilirubin: 0.5 mg/dL (ref 0.3–1.2)
Total Bilirubin: 0.6 mg/dL (ref 0.3–1.2)

## 2010-07-29 LAB — CBC
HCT: 24.5 % — ABNORMAL LOW (ref 36.0–46.0)
HCT: 25.9 % — ABNORMAL LOW (ref 36.0–46.0)
MCHC: 33.8 g/dL (ref 30.0–36.0)
MCV: 94.4 fL (ref 78.0–100.0)
MCV: 94.5 fL (ref 78.0–100.0)
Platelets: 268 10*3/uL (ref 150–400)
Platelets: 316 10*3/uL (ref 150–400)
Platelets: 441 10*3/uL — ABNORMAL HIGH (ref 150–400)
RBC: 3.05 MIL/uL — ABNORMAL LOW (ref 3.87–5.11)
RDW: 14.1 % (ref 11.5–15.5)
RDW: 14.9 % (ref 11.5–15.5)
WBC: 7.7 10*3/uL (ref 4.0–10.5)
WBC: 9.7 10*3/uL (ref 4.0–10.5)

## 2010-07-29 LAB — PROTEIN ELECTROPHORESIS, SERUM
Alpha-1-Globulin: 16.5 % — ABNORMAL HIGH (ref 2.9–4.9)
Alpha-2-Globulin: 17.1 % — ABNORMAL HIGH (ref 7.1–11.8)
Beta 2: 6.1 % (ref 3.2–6.5)
Gamma Globulin: 23 % — ABNORMAL HIGH (ref 11.1–18.8)
M-Spike, %: 0.52 g/dL

## 2010-07-29 LAB — BASIC METABOLIC PANEL
BUN: 4 mg/dL — ABNORMAL LOW (ref 6–23)
Calcium: 8.4 mg/dL (ref 8.4–10.5)
GFR calc non Af Amer: >60 mL/min (ref >60)
Glucose, Bld: 96 mg/dL (ref 70–99)
Potassium: 4.2 mEq/L (ref 3.5–5.1)

## 2010-07-29 LAB — HEMOCCULT GUIAC POC 1CARD (OFFICE): Fecal Occult Bld: NEGATIVE

## 2010-07-29 LAB — PREALBUMIN: Prealbumin: 7.6 mg/dL — ABNORMAL LOW (ref 18.0–45.0)

## 2010-07-30 ENCOUNTER — Ambulatory Visit (HOSPITAL_COMMUNITY): Payer: 59

## 2010-08-02 ENCOUNTER — Ambulatory Visit (HOSPITAL_COMMUNITY): Payer: 59

## 2010-08-03 LAB — COMPREHENSIVE METABOLIC PANEL
Albumin: 1.6 g/dL — ABNORMAL LOW (ref 3.5–5.2)
Alkaline Phosphatase: 70 U/L (ref 39–117)
BUN: 7 mg/dL (ref 6–23)
Potassium: 3.9 mEq/L (ref 3.5–5.1)
Sodium: 142 mEq/L (ref 135–145)
Total Protein: 4.9 g/dL — ABNORMAL LOW (ref 6.0–8.3)

## 2010-08-03 LAB — BASIC METABOLIC PANEL
BUN: 10 mg/dL (ref 6–23)
Calcium: 8.1 mg/dL — ABNORMAL LOW (ref 8.4–10.5)
Creatinine, Ser: 0.62 mg/dL (ref 0.4–1.2)
GFR calc non Af Amer: >60 mL/min (ref >60)
GFR calc non Af Amer: >60 mL/min (ref >60)
Glucose, Bld: 117 mg/dL — ABNORMAL HIGH (ref 70–99)
Potassium: 3.6 mEq/L (ref 3.5–5.1)
Sodium: 140 mEq/L (ref 135–145)

## 2010-08-03 LAB — BLOOD GAS, ARTERIAL
Bicarbonate: 17.7 mEq/L — ABNORMAL LOW (ref 20.0–24.0)
O2 Content: 3 L/min
O2 Saturation: 96.6 %
pO2, Arterial: 107 mmHg — ABNORMAL HIGH (ref 80.0–100.0)

## 2010-08-03 LAB — POCT I-STAT, CHEM 8
HCT: 30 % — ABNORMAL LOW (ref 36.0–46.0)
Hemoglobin: 10.2 g/dL — ABNORMAL LOW (ref 12.0–15.0)
Sodium: 138 mEq/L (ref 135–145)
TCO2: 22 mmol/L (ref 0–100)

## 2010-08-03 LAB — URINALYSIS, ROUTINE W REFLEX MICROSCOPIC
Nitrite: NEGATIVE
Specific Gravity, Urine: 1.015 (ref 1.005–1.030)
Urobilinogen, UA: 1 mg/dL (ref 0.0–1.0)

## 2010-08-03 LAB — DIFFERENTIAL
Basophils Relative: 0 % (ref 0–1)
Eosinophils Absolute: 0 10*3/uL (ref 0.0–0.7)
Monocytes Absolute: 1 10*3/uL (ref 0.1–1.0)
Neutro Abs: 15.4 10*3/uL — ABNORMAL HIGH (ref 1.7–7.7)

## 2010-08-03 LAB — RETICULOCYTES
RBC.: 2.85 MIL/uL — ABNORMAL LOW (ref 3.87–5.11)
Retic Count, Absolute: 14.3 10*3/uL — ABNORMAL LOW (ref 19.0–186.0)

## 2010-08-03 LAB — CROSSMATCH
ABO/RH(D): A POS
Antibody Screen: NEGATIVE

## 2010-08-03 LAB — CARDIAC PANEL(CRET KIN+CKTOT+MB+TROPI)
CK, MB: 3.3 ng/mL (ref 0.3–4.0)
Total CK: 42 U/L (ref 7–177)

## 2010-08-03 LAB — CBC
HCT: 29.3 % — ABNORMAL LOW (ref 36.0–46.0)
Hemoglobin: 9.7 g/dL — ABNORMAL LOW (ref 12.0–15.0)
Hemoglobin: 9.9 g/dL — ABNORMAL LOW (ref 12.0–15.0)
MCHC: 33.3 g/dL (ref 30.0–36.0)
MCHC: 33.4 g/dL (ref 30.0–36.0)
MCV: 94.3 fL (ref 78.0–100.0)
MCV: 94.6 fL (ref 78.0–100.0)
Platelets: 258 10*3/uL (ref 150–400)
RBC: 2.63 MIL/uL — ABNORMAL LOW (ref 3.87–5.11)
RBC: 3.06 MIL/uL — ABNORMAL LOW (ref 3.87–5.11)
RBC: 3.16 MIL/uL — ABNORMAL LOW (ref 3.87–5.11)
RDW: 14.7 % (ref 11.5–15.5)
RDW: 15.2 % (ref 11.5–15.5)
WBC: 15.5 10*3/uL — ABNORMAL HIGH (ref 4.0–10.5)

## 2010-08-03 LAB — ABO/RH: ABO/RH(D): A POS

## 2010-08-03 LAB — CULTURE, BLOOD (ROUTINE X 2)

## 2010-08-03 LAB — CULTURE, RESPIRATORY W GRAM STAIN

## 2010-08-03 LAB — HEPATIC FUNCTION PANEL
Alkaline Phosphatase: 95 U/L (ref 39–117)
Bilirubin, Direct: 0.3 mg/dL (ref 0.0–0.3)
Indirect Bilirubin: 0.5 mg/dL (ref 0.3–0.9)
Total Bilirubin: 0.8 mg/dL (ref 0.3–1.2)

## 2010-08-03 LAB — EXPECTORATED SPUTUM ASSESSMENT W GRAM STAIN, RFLX TO RESP C

## 2010-08-03 LAB — IRON AND TIBC
Iron: 18 ug/dL — ABNORMAL LOW (ref 42–135)
TIBC: 134 ug/dL — ABNORMAL LOW (ref 250–470)

## 2010-08-03 LAB — URINE CULTURE

## 2010-08-03 LAB — URINE MICROSCOPIC-ADD ON

## 2010-08-03 LAB — POCT CARDIAC MARKERS: Myoglobin, poc: 70.1 ng/mL (ref 12–200)

## 2010-08-03 LAB — PROTIME-INR
INR: 1.2 (ref 0.00–1.49)
Prothrombin Time: 15.8 seconds — ABNORMAL HIGH (ref 11.6–15.2)

## 2010-08-03 LAB — FOLATE: Folate: 17.3 ng/mL

## 2010-08-03 LAB — POTASSIUM: Potassium: 3.1 mEq/L — ABNORMAL LOW (ref 3.5–5.1)

## 2010-08-04 ENCOUNTER — Ambulatory Visit (HOSPITAL_COMMUNITY): Payer: 59

## 2010-08-06 ENCOUNTER — Ambulatory Visit (HOSPITAL_COMMUNITY): Payer: 59

## 2010-08-09 ENCOUNTER — Ambulatory Visit (HOSPITAL_COMMUNITY): Payer: 59

## 2010-08-11 ENCOUNTER — Other Ambulatory Visit: Payer: Self-pay | Admitting: Cardiology

## 2010-08-11 ENCOUNTER — Ambulatory Visit (HOSPITAL_COMMUNITY): Payer: 59

## 2010-08-11 NOTE — Telephone Encounter (Signed)
escribe request  

## 2010-08-12 ENCOUNTER — Telehealth: Payer: Self-pay | Admitting: Cardiology

## 2010-08-12 DIAGNOSIS — T148XXA Other injury of unspecified body region, initial encounter: Secondary | ICD-10-CM

## 2010-08-12 NOTE — Telephone Encounter (Signed)
Has a question about her meds and brusing. She is wondering if her meds need adjusting.

## 2010-08-13 ENCOUNTER — Ambulatory Visit (HOSPITAL_COMMUNITY): Payer: 59

## 2010-08-13 ENCOUNTER — Telehealth: Payer: Self-pay | Admitting: Cardiology

## 2010-08-13 NOTE — Telephone Encounter (Signed)
Pt returning your call still having bleeding where stent was put in please call home before 115 after call cell [phone]

## 2010-08-13 NOTE — Telephone Encounter (Signed)
Had left message for patient yesterday and she called back to day.  Patient has had some increase bruising on the medication.  Woke up the other morning with a bruise on her leg smaller than a baseball but bigger than a golf ball.  Patient had been instructed to decrease 81 mg ASA to every other day at last office visit by Dr. Patty Sermons but to continue Effient daily.  Discussed with Lawson Fiscal and she said this is most likely causing, but to get CBC.  Advised patient and scheduled CBC for Monday.

## 2010-08-16 ENCOUNTER — Other Ambulatory Visit (INDEPENDENT_AMBULATORY_CARE_PROVIDER_SITE_OTHER): Payer: 59 | Admitting: *Deleted

## 2010-08-16 ENCOUNTER — Ambulatory Visit (HOSPITAL_COMMUNITY): Payer: 59

## 2010-08-16 DIAGNOSIS — T148XXA Other injury of unspecified body region, initial encounter: Secondary | ICD-10-CM

## 2010-08-16 NOTE — H&P (Signed)
NAMEELAINE, ROANHORSE NO.:  [id number]  MEDICAL RECORD NO.:  [id number]           PATIENT TYPE:  E  LOCATION:  [facility]                         FACILITY:  [facility]  PHYSICIAN:  Therisa Doyne, MD    DATE OF BIRTH:  06-Mar-1946  DATE OF ADMISSION:  06/17/2010 DATE OF DISCHARGE:                             HISTORY & PHYSICAL   PRIMARY CARDIOLOGIST:  Cassell Clement, MD  CHIEF COMPLAINT:  Chest pain.  HISTORY OF PRESENT ILLNESS:  A 65 year old white female with a past medical history of coronary artery disease, hypertension, hyperlipidemia who presents for evaluation of recurrent chest pain.  Historically, she was hospitalized in January for an episode of unstable angina.  At that time, she underwent drug-eluting stent placement with a PROMUS drug- eluting stent to the right coronary artery.  She had residual disease that was approximately 70% in a small and tortuous left circumflex artery.  Her left ventricular function was preserved.  Since being discharged from the hospital in January, she has had continued intermittent chest pain that occurs approximately 3-4 times per week. It is typically associated with meals after she eats dinner.  She denies any exertional chest pain, and denies shortness of breath, PND, orthopnea,  or lower extremity edema.  She does note that she is "nervous" about her residual lesion in her left circumflex artery.  Because of an episode of chest pain this evening, she came to the emergency room for further evaluation.  PAST MEDICAL HISTORY: 1. Coronary artery disease (see history of present illness for     details). 2. Hyperlipidemia. 3. Hypertension. 4. Sinus bradycardia for which the patient is not on a beta-blocker.  SOCIAL HISTORY:  The patient is retired.  She quit smoking 2 months ago. She denies any alcohol use.  FAMILY HISTORY:  Negative for premature coronary artery disease.  REVIEW OF SYSTEMS:  All systems were reviewed and  negative except as mentioned above in the history of present illness.  ALLERGIES: 1. SULFA. 2. ZETIA. 3. HIGH-DOSE STATIN THERAPY with ZOCOR.  MEDICATIONS: 1. Vitamin D3. 2. Vitamin C. 3. Prasugrel 10 mg daily. 4. Protonix 40 mg daily. 5. Sublingual nitroglycerin p.r.n. 6. Multivitamin daily. 7. Lipitor 20 mg daily. 8. Aspirin 325 mg daily. 9. Norvasc 5 mg daily.  PHYSICAL EXAMINATION:  VITAL SIGNS:  Temperature afebrile, blood pressure is 120/70, pulse 63, respirations 18, oxygen saturation 99% on room air. GENERAL:  No acute distress. HEENT:  Normocephalic and atraumatic.  Pupils are equal, round, and reactive to light and accommodation.  Extraocular movements are intact. Oropharynx is pink and moist without any lesions. NECK:  Supple without lymphadenopathy.  No jugular vein distention.  No masses. CARDIOVASCULAR:  Regular rate and rhythm.  No murmurs, rubs, or gallops. CHEST:  Clear to auscultation bilaterally. ABDOMEN:  Positive bowel sounds.  Soft, nontender, nondistended. EXTREMITIES:  No clubbing, cyanosis, or edema.  Dorsalis pedis pulses are 2+bilaterally. SKIN:  No rashes. BACK:  No CVA tenderness. PYSCH: Normal affect.  PERTINENT DATA:  Hemoglobin 10.3, platelets 287.  Cardiac enzymes are pending.  Chest x-ray, no acute cardiopulmonary disease.  EKG from 21:48 demonstrates sinus bradycardia 52 beats per minute, septal Q-waves, T-wave inversions anteriorly.  This EKG is largely unchanged from previous EKGs in January.  IMPRESSION AND PLAN:  Ms. Yow is a 65 year old white female with a past medical history significant for coronary artery disease status post drug- eluting stent placement to the right coronary artery in January 2012 who presents for evaluation of recurrent chest pain.  1. We will admit the patient to [facility] Cardiology under Dr.     Yevonne Pax care.  2. Chest pain.  I suspect that her chest pain is likely GI in etiology     given the  fact that it only occurs after eating meals, and she     does not have any exertional chest pain.  She does endorse that she     is nervous about the residual lesion in her circumflex artery, so     some of this pain could be stress induced.  We will continue on her     home medications of aspirin, Effient, and Lipitor.  She is not     able to be on beta-blocker because of a resting bradycardia.  We     will empirically increase her Protonix to 40 mg b.i.d and start her           on Imdur 30 mg daily, and hopefully this will alleviate some of     her symptoms.  We will cycle cardiac enzymes.  If her enzymes are     negative, I think she would benefit from an outpatient     Gastroenterology evaluation.  3. Hypertension, well-controlled.  Continue Norvasc.  4. Hyperlipidemia.  Check fasting profile.  Continue Lipitor.  5. Deep venous thrombosis prophylaxis with subcutaneous heparin.     Therisa Doyne, MD    SJT/MEDQ  D:  06/18/2010  T:  06/18/2010  Job:  [id number]  Electronically Signed by Aldona Bar MD on 08/16/2010 09:42:54 PM

## 2010-08-17 LAB — CBC WITH DIFFERENTIAL/PLATELET
Basophils Absolute: 0 10*3/uL (ref 0.0–0.1)
Lymphocytes Relative: 41 % (ref 12.0–46.0)
Monocytes Relative: 7.1 % (ref 3.0–12.0)
Neutrophils Relative %: 48.4 % (ref 43.0–77.0)
Platelets: 306 10*3/uL (ref 150.0–400.0)
RDW: 13.1 % (ref 11.5–14.6)

## 2010-08-18 ENCOUNTER — Ambulatory Visit (HOSPITAL_COMMUNITY): Payer: 59

## 2010-08-18 ENCOUNTER — Telehealth: Payer: Self-pay | Admitting: *Deleted

## 2010-08-18 NOTE — Telephone Encounter (Signed)
Adv pt to call back re-lab results

## 2010-08-19 ENCOUNTER — Telehealth: Payer: Self-pay | Admitting: *Deleted

## 2010-08-19 NOTE — Telephone Encounter (Signed)
Advised to continue effient and off ASA as advised previously

## 2010-08-19 NOTE — Telephone Encounter (Signed)
Message copied by Regis Bill on Thu Aug 19, 2010 11:12 AM ------      Message from: Cassell Clement      Created: Tue Aug 17, 2010  3:59 PM       Pls  Report.  CBC is improving.

## 2010-08-20 ENCOUNTER — Ambulatory Visit (HOSPITAL_COMMUNITY): Payer: 59

## 2010-08-23 ENCOUNTER — Ambulatory Visit (HOSPITAL_COMMUNITY): Payer: 59

## 2010-08-25 ENCOUNTER — Ambulatory Visit (HOSPITAL_COMMUNITY): Payer: 59

## 2010-08-27 ENCOUNTER — Ambulatory Visit (HOSPITAL_COMMUNITY): Payer: 59

## 2010-08-30 ENCOUNTER — Ambulatory Visit (HOSPITAL_COMMUNITY): Payer: 59

## 2010-08-31 NOTE — Consult Note (Signed)
Katherine [name], Katherine [name]                  ACCOUNT NO.:  [id number]   MEDICAL RECORD NO.:  [id number]          PATIENT TYPE:  INP   LOCATION:  1424                         FACILITY:  [facility]   PHYSICIAN:  Charlaine Dalton. Sherene Sires, MD, FCCPDATE OF BIRTH:  1945-09-17   DATE OF CONSULTATION:  06/16/2008  DATE OF DISCHARGE:                                 CONSULTATION   CONSULTATION REQUESTED BY:  Cassell Clement, M.D.   REASON FOR CONSULTATION:  Persistent pulmonary infiltrates, question  need for bronchoscopy.   HISTORY:  This is a 65 year old white female with a history of smoking  who presented with an acute illness beginning on February 18 and  ultimately resulting in hospitalization on February 25 with a diagnosis  of pneumonia.  Her history was that of an acute onset febrile illness on  the 18th with shaking chills, profound weakness with right pleuritic  pain but no significant initial sputum production.  In the hospital she  has defervesced and coughed up green sputum and actually has had  improvement on chest x-ray but a CT scan done on the 26th suggested  dense right upper lobe air space disease with associated mediastinal  hilar adenopathy with a differential diagnosis to include cancer and  tuberculosis, and therefore Dr. Patty Sermons asked me to see her.   The patient is coughing up green mucus now and has had no more fever.  She has had dyspnea but that is improved.  She has had diarrhea but that  also is better.  She has no difficulty swallowing, recent dental work or  recent travel exposure or any other unusual risk factors, myalgias,  arthralgias or rash or unusual or exotic pet exposure including birds.   PAST MEDICAL HISTORY:  Positive for essential hypertension, left bundle  branch block, hyperlipidemia, and monoclonal gammopathy of uncertain  significance.  She also is status post right inguinal herniorrhaphy and  remote tonsillectomy.   MEDICATIONS ON ADMISSION:  Included  lisinopril, hydrochlorothiazide,  calcium, aspirin, Zocor, vitamin C and vitamin D.  She has been on  Rocephin and Zithromax since admission with no positive sputum or blood  cultures.   ALLERGIES:  SHE IS ALLERGIC TO SULFA.   SOCIAL HISTORY:  She continues to smoke up to three cigarettes per day  but denies any alcohol use.   FAMILY HISTORY:  Significant for cancer in her father, not lung.  Mother  died at age 59 of a suicide, had an enlarged heart.   REVIEW OF SYSTEMS:  Taken in detail and negative except as outlined  above.  She assures me she has not been chronically ill with no  significant weight loss, chronic cough or unusual dyspnea or history of  hemoptysis.   PHYSICAL EXAMINATION:  This is a pleasant white female who appears to be  recuperating nicely.  She is afebrile with normal vital signs.  HEENT:  Significant for adequate dentition.  Oropharynx is clear.  Nasal  turbinates are normal.  NECK:  Supple without cervical adenopathy or tenderness.  Trachea is  midline.  No thyromegaly.  LUNGS:  Lung fields  reveal a few rhonchi bilaterally but no localized  wheezing.  She is saturating today 99% on 2 liters.  HEART:  Regular rate, rhythm without murmur, gallop or rub.  ABDOMEN:  Soft, benign with normal excursion.  Negative Hoover sign.  EXTREMITIES:  Warm without calf tenderness, cyanosis, clubbing or edema.   Chest x-ray shows definite improvement on the 28th compared to admission  with what was a very dense right upper lobe infiltrate becoming less  dense with air bronchograms present on CT scan.   LABORATORY DATA:  Hemoglobin has dropped from 9.9 on admission down to  8.3.  White count is normalized to 7.6.  Chemistry profile is  unremarkable with a normal bicarb level.  Blood gas on admission 7.35  with pCO2 of 32 and a pO2 of 107 on 3 liters.   IMPRESSION:  Acute right upper lobe community-acquired pneumonia, a  classic lobar pneumonia with most likely reactive  adenopathy on chest x-  ray.  I agree with Dr. Patty Sermons that is very unlikely this represents  tuberculosis based on the clinical presentation and on the x-ray which  is more typical of an acute lobar consolidating process and is  responding nicely to empiric therapy directed at community-acquired  pneumonia not otherwise specified.   Nevertheless, she does have a smoking history and I cannot categorically  exclude malignancy and would strongly recommend a followup chest x-ray  at 6 weeks and I would be happy to see her again in the office if there  is not marked improvement if not frank resolution of all the  infiltrates.  I would not however pursue the adenopathy with repeat CT  scanning as that would be extremely low yield in terms of a reversible  cause.   I assured the patient that she is following the usual course for  community-acquired pneumonia and that no further pulmonary intervention  is likely to be needed.  However, I strongly urged that she stop smoking  completely.      Charlaine Dalton. Sherene Sires, MD, [facility]  Electronically Signed     MBW/MEDQ  D:  06/16/2008  T:  06/16/2008  Job:  [id number]   cc:   Cassell Clement, M.D.  Fax: [phone]

## 2010-08-31 NOTE — Discharge Summary (Signed)
Katherine [name], MAINOR NO.:  [id number]   MEDICAL RECORD NO.:  [id number]          PATIENT TYPE:  INP   LOCATION:  1424                         FACILITY:  [facility]   PHYSICIAN:  Cassell Clement, M.D. DATE OF BIRTH:  Oct 16, 1945   DATE OF ADMISSION:  06/12/2008  DATE OF DISCHARGE:  06/18/2008                               DISCHARGE SUMMARY   FINAL DIAGNOSES:  1. Acute right upper lobe pneumonia.  2. Hypotension and dehydration.  3. Iron-deficiency anemia with normal stool Hemoccults  4. Acute delirium, resolved.  5. Recent head trauma.  6. Hypokalemia.  7. Left bundle branch block.  8. Past history of hypercholesterolemia.  9. History of chronic premature ventricular contractions.  10.Prerenal azotemia.   OPERATIONS PERFORMED:  CT angiogram of the chest.  Electroencephalogram.  CT of head.   HISTORY:  This is a 65 year old married Caucasian female who was  admitted through the emergency room where she presented with extreme  weakness, evidence of dehydration and right upper lobe lobar pneumonia  with a dense infiltrate.  She had been sick for a week with chills and  weakness.  She had had a fall approximately 6 days prior to admission  and had hit her head as she fell backwards on her carport.  She has had  no sputum production.  She has had some right-sided pleuritic chest  pain.  She has not had hemoptysis.  She had never had a pneumonia shot.   PAST HISTORY:  Positive for essential hypertension, left bundle branch  block and hypercholesterolemia.  She is also seen Dr. Arlan Organ in  the past for a monoclonal gammopathy of uncertain significance.   ADMISSION MEDICATIONS:  1. Lisinopril 10 mg a day.  2. Hydrochlorothiazide 12.5 mg daily.  3. Calcium 600 mg b.i.d.  4. Aspirin 81 mg daily.  5. Zocor 80 mg daily.  6. Vitamin C daily and vitamin D daily.   ALLERGIES:  The patient is allergic to SULFA.   PHYSICAL EXAMINATION:  After receiving 4 liters  of IV fluids, blood  pressure was still low at 96/60, pulse was 80 and regular, respirations  were normal.  She was afebrile.  The skin revealed no rash.  The head  and neck exam is unremarkable.  The chest revealed rales at the right  upper lobe.  There was no pleural friction rub.  The heart reveals a  soft systolic murmur at the base.  The abdomen is nontender.  No  hepatosplenomegaly.  Extremities show no edema.   LABORATORY DATA:  Chest chest x-ray showed a dense right upper lobe  infiltrate, otherwise the lungs were clear.  Her white count on  admission was 16,900, hemoglobin was 9.9.  Serum albumin was low at 1.9.  Liver function studies were normal.  BUN was 39, creatinine 1.6,  potassium 3.1, sodium 138.  Urinalysis showed pyuria.   HOSPITAL COURSE:  The patient was given IV fluids for IV fluid  resuscitation.  She was started empirically on Rocephin and Zithromax  IV.  Blood cultures were drawn in the emergency room and  were no growth  at the time of discharge.  The urine culture grew 10,000 colonies of  multiple bacterial species, none predominant, and there did not appear  to be any urinary tract infection.   By the day after admission the patient was feeling a little better,  still was not bringing up any sputum to speak of.  Her white count was  down to 15,500 and her hemoglobin had dropped to 9.7.  A stool was  negative for occult blood.  On the afternoon of February 26 the patient  became confused and overnight became acutely delirious and disoriented,  pulling out her IV, wandering in the hall, etc.  She felt like she had  been kidnapped.  It was felt that she had an acute delirium of uncertain  etiology.  She does not have any history of alcohol ingestion.  Her  hemoglobin on February 27 had dropped to 8.9, white count was down to  10,300.  A D-dimer was elevated at 4.18.  The patient underwent a stat  CT of the chest, which did not show any evidence of pulmonary  emboli.  She had a CT of the brain, showing no evidence of any acute change  related to her recent trauma.  She was seen by neurology consultant who  diagnosed acute delirium and recommended an electroencephalogram and to  repeat the CT of the head if she gets worse.  During the night she had  been given a single dose of Haldol and required no further Haldol and  her mentation returned to normal over the next 24 hours.  She had a PPD  skin test which was negative.  She had an anemia workup.  The anemia  panel showed iron deficiency anemia.  She gave a history of having had a  colonoscopy 3 years earlier by Dr. Matthias Hughs.  A second Hemoccult was also  negative later the hospital stay.  Serum albumin was very low and  prealbumin was also low.  It was felt that this represented malnutrition  from her recent illness.  She complained of leg pain and we did venous  Dopplers which showed no evidence of DVT.  On June 16, 2008, we  requested pulmonary consultation because of slow clearing of the right  upper lobe infiltrate radiographically.  She was seen by Dr. Francella Solian,  who felt that the patient had lobar pneumonia clinically and did not  think she needed bronchoscopy unless she worsened again or began to have  hemoptysis.  She had an electroencephalogram, which was normal, and by  June 18, 2008, was stable enough to be discharged home.   LABORATORY STUDIES:  On the day of discharge, hemoglobin is 9.7, white  count is 9.7.  Sodium 134, potassium 4.0, blood sugar 102, BUN 5,  creatinine 0.62.  Prealbumin was low at 7.6 with normal being 18-45.  Sputum culture grew only a few gram-positive rods.  Anemia panel showed  a low number of absolute reticulocytes at 14, a serum iron of 18, total  iron binding capacity of 134, percent saturation of 13, which is below  the normal of 20-55.  Vitamin B12 level was greater than 2000.  Serum  folate was 17.3.  Ferritin was 836, consistent with an acute phase   reactant.  CK-MB and troponin-I were normal and there was no evidence of  myocardial injury.  Serum magnesium was 1.9.  Her electrocardiogram  showed left bundle branch block.    The patient is being  discharged on June 18, 2008, on a regular high  protein diet.  She will eat Activia yogurt each day to help prevent  antibiotic-associated diarrhea.  She is to increase her activity slowly.  She is to use her incentive spirometry frequently during the day.  We  will plan to see her back in one week for office visit, CBC and a CMET.  We will plan to see her back in several weeks for a follow-up chest x-  ray.   DISCHARGE MEDICATIONS:  1. Aspirin 81 mg daily.  2. Ferrous sulfate 325 one daily.  3. Lomotil if needed for diarrhea.  4. K-Dur 20 mEq daily.  5. Mucinex 600 mg twice a day.  6. Multivitamin, Centrum Silver or similar, one daily.  7. Avelox 400 mg one daily for 5 days.  8. Tylenol 325 two every 6 hours p.r.n.  9. Vitamin C daily.  10.Vitamin D daily.   She is to stop her lisinopril, her simvastatin and her  hydrochlorothiazide for the time being.  She is to drink plenty of  fluids.   Time spent in discharge preparation great and 30 minutes.           ______________________________  Cassell Clement, M.D.     TB/MEDQ  D:  06/18/2008  T:  06/18/2008  Job:  [id number]   cc:   Deanna Artis. Sharene Skeans, M.D.  Fax: [phone]   Charlaine Dalton. Sherene Sires, MD, FCCP  [address]  [place] [address]

## 2010-08-31 NOTE — Procedures (Signed)
EEG NUMBER:  [id number].   HISTORY:  This is a 65 year old patient with a history of pneumonia,  hyperkalemia.  The patient has had confusion, agitation, disorientation  on admission.   MEDICATIONS:  Include Mucinex, potassium, aspirin, Lovenox, Rocephin,  Zithromax, Zofran, Robitussin, lisinopril, simvastatin,  hydrochlorothiazide.   This is a portable EEG recording.  No skull defects noted.   EEG CLASSIFICATION:  Essentially normal, awake and drowsy.   DESCRIPTION OF RECORDING:  Background rhythm of this recording consists  of a fairly well-modulated medium amplitude alpha rhythm of 8 Hz that is  reactive to eye opening and closure.  As the record progresses, the  patient appears to drift in and out the drowsy state throughout the  recording.  Never clearly entered stage II sleep.  During periods of  drowsiness, there appears to be a background 7 Hz slowing seen that is  bilaterally symmetric.  During the recording, photic stimulation and  hyperventilation were not performed.  At no time during the recording,  there appeared to be evidence of spikes, spike wave discharges, or  evidence of focal slowing.  EKG monitor shows no evidence of cardiac  rhythm abnormalities with a heart rate of 66.   IMPRESSION:  This is an essentially normal EEG recording in awake and  drowsy state.  No evidence of ictal or interictal discharges were seen.      Marlan Palau, M.D.  Electronically Signed     [id number]  D:  06/16/2008 17:30:28  T:  06/17/2008 08:41:08  Job #:  [id number]

## 2010-08-31 NOTE — Consult Note (Signed)
NAMETIMEA, BREED NO.:  [id number]   MEDICAL RECORD NO.:  [id number]          PATIENT TYPE:  INP   LOCATION:  1424                         FACILITY:  [facility]   PHYSICIAN:  Noel Christmas, MD    DATE OF BIRTH:  1945/08/25   DATE OF CONSULTATION:  06/14/2008  DATE OF DISCHARGE:                                 CONSULTATION   REASON FOR CONSULTATION:  Altered mental status with acute confusion and  agitation.   HISTORY OF PRESENT ILLNESS:  This is a 65 year old lady, who was  admitted on June 12, 2008, with right upper lobe pneumonia,  hypokalemia, dehydration, premature ventricular contractions, and left  bundle branch block.  She was started on IV fluids as well as Rocephin  and Zithromax IV.  Blood cultures have been negative.  Urine culture was  positive.  Organism identification is pending.  The patient started  becoming confused yesterday and became acutely agitated early this  morning including pulling out her IV line as well as having paranoid  ideations and disorientation to place.  There is no previous history of  mental status abnormalities.  She has been afebrile throughout her stay  here.  A CT scan of her head was obtained yesterday, which showed low  density frontal lobe abnormalities, but no acute intracranial  abnormality.  The patient gives a history of experiencing a fall with  impact to the back of her head about 1 week prior to admission.  There  was no loss of consciousness.  Family members noted no mental status  changes.  CBC showed no indications of acute infection today.  Electrolytes were unremarkable.  BUN and creatinine are back to normal.  She was given Haldol 4 mg IV early this morning and has had a good  response.  Agitation has ceased.  No focal motor deficit has been  reported nor any changes in her speech.   PAST MEDICAL HISTORY:  Remarkable for:  1. Hypertension.  2. Hypercholesterolemia.   CURRENT MEDICATIONS:  1.  Mucinex 600 mg b.i.d.  2. Potassium chloride 20 mEq t.i.d.  3. Aspirin 81 mg per day.  4. Lovenox 40 mg subcu q.h.s.  5. Ensure 237 mL twice a day.  6. Rocephin 1 g IV per day.  7. Zithromax 500 mg IV per day.  8. Zofran 4 mg p.r.n.  9. Zopedin (Ambien) 10 mg q.h.s. p.r.n.  10.Magnesium hydroxide p.r.n.  11.Robitussin-DM 15 mL q.4 hours p.r.n.  12.Xanax 0.25 mg b.i.d. p.r.n. (none given so far).  13.Haldol 4 mg IV q.4 hours p.r.n. agitation.   FAMILY HISTORY:  Father died from malignancy at age 17.  He had a  history of heart failure.  Her mother died from suicide.  She also had  heart problems.  Family history is otherwise unremarkable.   PHYSICAL EXAMINATION:  GENERAL:  Appearance was that of late middle-aged  lady, medium build, who was alert and cooperative and in no acute  distress.  She was well-oriented to time as well as place.  She had mild  difficulty with being able to  follow simple commands at times and tended  to perseverate.  She also had short-term memory difficulty with being  able to remember only 1 of 3 words after 3 minutes.  Long-term memory  was good.  No indication of receptive nor expressive aphasia.  Pupils  were equal and reactive normally to light.  Extraocular movements were  full and conjugate.  Visual fields were intact and normal.  There was no  facial weakness.  Hearing was normal.  Speech and palatal movement were  normal.  Coordination was normal.  Strength and muscle tone were normal  throughout.  Deep tendon reflexes were 2+ and brisk, especially in the  lower extremities.  Plantar responses were extensor (bilateral Babinski  sign).  Sensory exam was normal.  Carotid auscultation was normal.   IMPRESSION:  1. Mental status change acutely with acute delirium of unclear      etiology.  There is no clear sign of infectious process involving      the central nervous system nor any signs of significant metabolic      nor intracranial structural  abnormality.  The patient has improved      significantly with administration of Haldol.  There is no evidence      of acute stroke clinically.  2. Cannot rule out mild concussion as a contributing factor to mental      status changes, given her history of recent closed head injury.   RECOMMENDATIONS:  1. No change in current management with use of Haldol for sedation as      needed.  2. Avoid or at least minimize use of sedating medications otherwise.  3. EEG on June 16, 2008, if mental status is not back to normal.  4. Repeat CT of her head if she worsens.   Thank you for asking me to evaluate Ms. Caetano.      Noel Christmas, MD  Electronically Signed     CS/MEDQ  D:  06/14/2008  T:  06/14/2008  Job:  [id number]

## 2010-08-31 NOTE — H&P (Signed)
NAMEMCKENZI, BUONOMO NO.:  [id number]   MEDICAL RECORD NO.:  [id number]          PATIENT TYPE:  INP   LOCATION:  1424                         FACILITY:  [facility]   PHYSICIAN:  Cassell Clement, M.D. DATE OF BIRTH:  [birth year]   DATE OF ADMISSION:  06/12/2008  DATE OF DISCHARGE:                              HISTORY & PHYSICAL   CHIEF COMPLAINT:  Weakness and fever.   FAMILY HISTORY:  This is a 65 year old married Caucasian female admitted  to the emergency room where she presented with extreme weakness and  evidence of dehydration.  She gives a history of having been sick for a  week.  She had onset of chills on the evening of February 18, 1 week  ago.  On the following morning she was still having chills and that day  she was so weak that she fell backwards on her carport and hit her head.  She is able to crawl back to the house.  She has stayed in bed  essentially since then except to crawl to the kitchen to get a drink of  juice periodically.  Last evening, she began having onset of some  diarrhea but that cleared after a single Lomotil.  She has had some  pleuritic right chest pain yesterday.  She has had no sputum whatsoever.  She has not had any hemoptysis.  She has never had a pneumonia shot.   PAST HISTORY:  Positive for essential hypertension, left bundle branch  block, hypercholesterolemia, and in April 2009 she saw Dr. Arlan Organ  for an abnormal serum protein electrophoresis and was diagnosed with  monoclonal gammopathy of uncertain significance (MUGS).  Dr. [name] did  not recommend any treatment necessary.   PRESENT MEDICATIONS:  1. Lisinopril 10 mg daily.  2. Hydrochlorothiazide 12.5 mg daily.  3. Calcium 600 b.i.d.  4. Aspirin 81 mg daily.  5. Zocor 80 mg daily.  6. Vitamin C daily.  7. Vitamin D daily.   ALLERGIES:  She is allergic to SULFA.   SOCIAL HISTORY:  Reveals that she is married.  She smokes 2 to 3  cigarettes a day.  She does  not drink any alcohol.  She does not work  any more but she does keep her grandchildren 3 days a week.   FAMILY HISTORY:  Father died of heart failure at 95 and cancer.  Mother  died at 41 of suicide, but also had an enlarged heart.   SURGERY:  Includes a right inguinal herniorrhaphy in 2009 by Dr. Claud Kelp.  Tonsillectomy age 49.   REVIEW OF SYSTEMS:  All other systems negative in detail.   PHYSICAL EXAMINATION:  VITAL SIGNS:  After for the 4 liters of IV fluids  blood pressure is 96/60, pulses 80 regular, respirations normal and she  is afebrile.  GENERAL APPEARANCE:  Reveals a well-developed, well-nourished woman in  no acute distress.  SKIN:  Reveals no skin rash.  HEAD AND NECK:  Pupils equal and reactive.  Sclerae nonicteric.  Mouth  and pharynx are normal.  Jugular venous pressure normal.  CHEST:  Reveals some rales in the right upper lobe area.  There is no  pleural friction rub.  HEART:  Reveals a quiet precordium without murmur, gallop, rub or click.  ABDOMEN:  Reveals no hepatosplenomegaly or masses.  The liver is  nontender.  EXTREMITIES:  Show no phlebitis or edema.  Pedal pulses are good.  NEUROLOGIC:  Exam is physiologic.  LYMPHATICS:  There is no lymphadenopathy.   Her chest x-ray shows a dense right upper lobe infiltrate and the lungs  are otherwise clear.  Her white count is 16,900, hemoglobin is 9.9,  platelets are 233,000.  Serum albumin is low at 1.9.  Liver function  studies are normal.  BUN 39, creatinine 1.6, potassium 3.1, sodium 138.  Urinalysis shows some pyuria.   IMPRESSION:  1. Right upper lobe pneumonia.  2. Hypokalemia.  3. Dehydration.  4. Left bundle branch block.  5. History of premature ventricular contractions.  6. History of hypercholesterolemia.   DISPOSITION:  We are going to treat with IV fluids will treat with IV  Rocephin and IV Zithromax at this point.  Blood cultures have already  been drawn in the emergency room.  Will  plan to replete her potassium.  Will follow her closely with serial electrolytes, CBC and chest x-rays.  If she fails to show prompt improvement, we will consider pulmonary  consultation for possible bronchoscopy.           ______________________________  Cassell Clement, M.D.     TB/MEDQ  D:  06/12/2008  T:  06/12/2008  Job:  [id number]

## 2010-09-01 ENCOUNTER — Ambulatory Visit (HOSPITAL_COMMUNITY): Payer: 59

## 2010-09-03 ENCOUNTER — Ambulatory Visit (HOSPITAL_COMMUNITY): Payer: 59

## 2010-09-06 ENCOUNTER — Ambulatory Visit (HOSPITAL_COMMUNITY): Payer: 59

## 2010-09-08 ENCOUNTER — Ambulatory Visit (HOSPITAL_COMMUNITY): Payer: 59

## 2010-09-10 ENCOUNTER — Ambulatory Visit (HOSPITAL_COMMUNITY): Payer: 59

## 2010-09-13 ENCOUNTER — Ambulatory Visit (HOSPITAL_COMMUNITY): Payer: 59

## 2010-09-15 ENCOUNTER — Ambulatory Visit (HOSPITAL_COMMUNITY): Payer: 59

## 2010-09-17 ENCOUNTER — Ambulatory Visit (HOSPITAL_COMMUNITY): Payer: 59

## 2010-09-20 ENCOUNTER — Ambulatory Visit (HOSPITAL_COMMUNITY): Payer: 59

## 2010-09-22 ENCOUNTER — Ambulatory Visit (HOSPITAL_COMMUNITY): Payer: 59

## 2010-09-24 ENCOUNTER — Ambulatory Visit (HOSPITAL_COMMUNITY): Payer: 59

## 2010-09-27 ENCOUNTER — Ambulatory Visit (HOSPITAL_COMMUNITY): Payer: 59

## 2010-09-29 ENCOUNTER — Ambulatory Visit (HOSPITAL_COMMUNITY): Payer: 59

## 2010-10-01 ENCOUNTER — Ambulatory Visit (HOSPITAL_COMMUNITY): Payer: 59

## 2010-10-19 ENCOUNTER — Telehealth: Payer: Self-pay | Admitting: Cardiology

## 2010-10-19 ENCOUNTER — Other Ambulatory Visit: Payer: Self-pay | Admitting: *Deleted

## 2010-10-19 DIAGNOSIS — Z79899 Other long term (current) drug therapy: Secondary | ICD-10-CM

## 2010-10-19 DIAGNOSIS — E785 Hyperlipidemia, unspecified: Secondary | ICD-10-CM

## 2010-10-19 NOTE — Telephone Encounter (Signed)
Advised husband no premed needed

## 2010-10-19 NOTE — Telephone Encounter (Signed)
Pt wants to know if she needs to take antibiotic before her dentist appt please call

## 2010-10-21 ENCOUNTER — Other Ambulatory Visit (INDEPENDENT_AMBULATORY_CARE_PROVIDER_SITE_OTHER): Payer: 59 | Admitting: *Deleted

## 2010-10-21 DIAGNOSIS — Z79899 Other long term (current) drug therapy: Secondary | ICD-10-CM

## 2010-10-21 DIAGNOSIS — E785 Hyperlipidemia, unspecified: Secondary | ICD-10-CM

## 2010-10-21 LAB — LIPID PANEL
Cholesterol: 173 mg/dL (ref 0–200)
HDL: 57.5 mg/dL (ref >39.00)
VLDL: 15 mg/dL (ref 0.0–40.0)

## 2010-10-21 LAB — BASIC METABOLIC PANEL
BUN: 12 mg/dL (ref 6–23)
Calcium: 8.8 mg/dL (ref 8.4–10.5)
GFR: 97.6 mL/min (ref >60.00)
Potassium: 3.6 mEq/L (ref 3.5–5.1)
Sodium: 137 mEq/L (ref 135–145)

## 2010-10-21 LAB — CBC WITH DIFFERENTIAL/PLATELET
Basophils Absolute: 0 10*3/uL (ref 0.0–0.1)
Eosinophils Relative: 2.3 % (ref 0.0–5.0)
HCT: 37.2 % (ref 36.0–46.0)
Hemoglobin: 12.6 g/dL (ref 12.0–15.0)
Lymphocytes Relative: 34.8 % (ref 12.0–46.0)
Lymphs Abs: 1.9 10*3/uL (ref 0.7–4.0)
Monocytes Relative: 10.5 % (ref 3.0–12.0)
Platelets: 346 10*3/uL (ref 150.0–400.0)
WBC: 5.6 10*3/uL (ref 4.5–10.5)

## 2010-10-21 LAB — HEPATIC FUNCTION PANEL
ALT: 12 U/L (ref 0–35)
AST: 20 U/L (ref 0–37)
Total Protein: 7.7 g/dL (ref 6.0–8.3)

## 2010-10-25 ENCOUNTER — Ambulatory Visit (INDEPENDENT_AMBULATORY_CARE_PROVIDER_SITE_OTHER): Payer: 59 | Admitting: Cardiology

## 2010-10-25 ENCOUNTER — Encounter: Payer: Self-pay | Admitting: Cardiology

## 2010-10-25 ENCOUNTER — Telehealth: Payer: Self-pay | Admitting: Cardiology

## 2010-10-25 DIAGNOSIS — I2510 Atherosclerotic heart disease of native coronary artery without angina pectoris: Secondary | ICD-10-CM

## 2010-10-25 DIAGNOSIS — I119 Hypertensive heart disease without heart failure: Secondary | ICD-10-CM

## 2010-10-25 DIAGNOSIS — F17200 Nicotine dependence, unspecified, uncomplicated: Secondary | ICD-10-CM

## 2010-10-25 DIAGNOSIS — E785 Hyperlipidemia, unspecified: Secondary | ICD-10-CM

## 2010-10-25 DIAGNOSIS — Z720 Tobacco use: Secondary | ICD-10-CM

## 2010-10-25 NOTE — Assessment & Plan Note (Signed)
The patient continues to be a nonsmoker although her husband still smokes

## 2010-10-25 NOTE — Progress Notes (Signed)
Katherine [name] Date of Birth:  October 12, 1945 [facility] / [facility] [address]   [address] [place], [place]  [address] [phone]           Fax   [phone]  History of Present Illness: This pleasant 65 year old woman is seen for a scheduled followup office visit she has a history of known ischemic heart disease.  She had cardiac catheterization and PCI on 05/14/10 and was found to have a very high-grade lesion in the midportion of the right coronary artery.  This was treated with 2 overlapping drug-eluting stents.  She had nonobstructive disease in the circumflex obtuse marginal and in the LAD.  She was readmitted on 06/18/10 and had repeat catheter for recurrent chest pain and was found to have a patent right coronary artery stent and residual nonobstructive disease elsewhere.  Since then she's had no further recurrence of chest pain.  She has remained on Effient but is no longer on any aspirin because of recurrent nose bleeds.  She's not had any nosebleeds since stopping the aspirin.  3 days prior to her PCI the patient underwent nuclear stressTesting that was negative for ischemia.  Current Outpatient Prescriptions  Medication Sig Dispense Refill  . amLODipine (NORVASC) 5 MG tablet TAKE ONE TABLET BY MOUTH EVERY DAY FOR BLOOD PRESSURE  30 tablet  3  . Ascorbic Acid (VITAMIN C) 500 MG tablet Take 250 mg by mouth daily.       [name] atorvastatin (LIPITOR) 20 MG tablet Take 20 mg by mouth daily. Taking 1/2 daily      . Cholecalciferol (VITAMIN D-3 PO) Take 1,000 mg by mouth daily.        . Multiple Vitamin (MULTIVITAMIN) tablet Take 1 tablet by mouth daily.        . nitroGLYCERIN (NITROSTAT) 0.4 MG SL tablet Place 0.4 mg under the tongue every 5 (five) minutes as needed.        . prasugrel (EFFIENT) 10 MG TABS Take 10 mg by mouth daily.        [name] DISCONTD: aspirin 81 MG tablet Take 1 tablet (81 mg total) by mouth every other day.  100 tablet  3  . DISCONTD: pantoprazole (PROTONIX)  40 MG tablet Take 40 mg by mouth daily.          Allergies  Allergen Reactions  . Ezetimibe   . Isosorbide Swelling    SWELLING OF THE TONGUE  . Sulfonamide Derivatives     Patient Active Problem List  Diagnoses  . Anginal pain  . Coronary artery disease  . Nosebleed  . Hypertension  . History Of Hypercholesterolemia  . Left bundle-branch block  . Tobacco abuse  . Ischemic heart disease    History  Smoking status  . Former Smoker  . Quit date: 05/05/2010  Smokeless tobacco  . Not on file    History  Alcohol Use No    Family History  Problem Relation Age of Onset  . Cancer Father   . Heart disease Father     Review of Systems: Constitutional: no fever chills diaphoresis or fatigue or change in weight.  Head and neck: no hearing loss, no epistaxis, no photophobia or visual disturbance. Respiratory: No cough, shortness of breath or wheezing. Cardiovascular: No chest pain peripheral edema, palpitations. Gastrointestinal: No abdominal distention, no abdominal pain, no change in bowel habits hematochezia or melena. Genitourinary: No dysuria, no frequency, no urgency, no nocturia. Musculoskeletal:No arthralgias, no back pain, no gait disturbance or  myalgias. Neurological: No dizziness, no headaches, no numbness, no seizures, no syncope, no weakness, no tremors. Hematologic: No lymphadenopathy, no easy bruising. Psychiatric: No confusion, no hallucinations, no sleep disturbance.    Physical Exam: Filed Vitals:   10/25/10 1338  BP: 136/78  Pulse: 80  The general appearance feels a well-developed well-nourished woman in no distress.Pupils equal and reactive.   Extraocular Movements are full.  There is no scleral icterus.  The mouth and pharynx are normal.  The neck is supple.  The carotids reveal no bruits.  The jugular venous pressure is normal.  The thyroid is not enlarged.  There is no lymphadenopathy.The chest is clear to percussion and auscultation. There are no  rales or rhonchi. Expansion of the chest is symmetrical.  Heart reveals a soft systolic ejection murmur at the base over the aortic valve.  No diastolic murmur.  The abdomen is soft and nontender. Bowel sounds are normal. The liver and spleen are not enlarged. There Are no abdominal masses. There are no bruits.The pedal pulses are good.  There is no phlebitis or edema.  There is no cyanosis or clubbing.Strength is normal and symmetrical in all extremities.  There is no lateralizing weakness.  There are no sensory deficits.The skin is warm and dry.  There is no rash.   Assessment / Plan: Stable ischemic heart disease doing well after PCI of right coronary artery on 05/14/10.  Of note is that 3 days prior to her PCI she had an nuclear stress test which did not show ischemia.  Also of note is the fact that she had an echocardiogram on 05/07/10 which showed moderate LVH but no evidence of any significant aortic valve stenosis.  The patient will continue same medication and be rechecked in 4 months for followup office visit lipid panel and chemistries we reviewed today's labs which are satisfactory especially considering that she decreased her Lipitor to just every other day because of concern about memory problems

## 2010-10-25 NOTE — Telephone Encounter (Signed)
Called requesting a copy of her most recent lab work. Please mail them at your earliest convenience.

## 2010-10-25 NOTE — Telephone Encounter (Signed)
Susie mailed

## 2010-10-25 NOTE — Assessment & Plan Note (Signed)
The patient has a history of known ischemic heart disease.  She had cardiac catheterization and PCI by Dr. Riley Kill on 05/14/10.  She had a very high-grade lesion in the midportion of the right coronary artery which was treated with 2 overlapping stents.  She was noted to have residual coronary disease in the circumflex obtuse marginal and in the LAD she has drug-eluting stents and will need to be on Effient release 12 months.  She is no longer on aspirin because of repeated epistaxis.  Of note is the fact she had a subsequent admission on 06/18/10 and underwent cardiac catheterization which showed the right coronary artery stent to be patent and she had residual nonobstructive disease elsewhere.  She's had no recurrent chest pain since then.

## 2011-02-07 ENCOUNTER — Other Ambulatory Visit: Payer: Self-pay | Admitting: Obstetrics and Gynecology

## 2011-02-07 DIAGNOSIS — Z1231 Encounter for screening mammogram for malignant neoplasm of breast: Secondary | ICD-10-CM

## 2011-02-23 ENCOUNTER — Ambulatory Visit (INDEPENDENT_AMBULATORY_CARE_PROVIDER_SITE_OTHER): Payer: 59 | Admitting: Cardiology

## 2011-02-23 ENCOUNTER — Other Ambulatory Visit (INDEPENDENT_AMBULATORY_CARE_PROVIDER_SITE_OTHER): Payer: 59 | Admitting: *Deleted

## 2011-02-23 ENCOUNTER — Ambulatory Visit
Admission: RE | Admit: 2011-02-23 | Discharge: 2011-02-23 | Disposition: A | Discharge status: Home / Self Care | Payer: 59 | Source: Ambulatory Visit | Attending: Obstetrics and Gynecology | Admitting: Obstetrics and Gynecology

## 2011-02-23 ENCOUNTER — Encounter: Payer: Self-pay | Admitting: Cardiology

## 2011-02-23 VITALS — BP 116/78 | HR 60 | Ht 65.0 in | Wt 134.8 lb

## 2011-02-23 DIAGNOSIS — I447 Left bundle-branch block, unspecified: Secondary | ICD-10-CM

## 2011-02-23 DIAGNOSIS — E7800 Pure hypercholesterolemia, unspecified: Secondary | ICD-10-CM

## 2011-02-23 DIAGNOSIS — Z8639 Personal history of other endocrine, nutritional and metabolic disease: Secondary | ICD-10-CM

## 2011-02-23 DIAGNOSIS — I119 Hypertensive heart disease without heart failure: Secondary | ICD-10-CM

## 2011-02-23 DIAGNOSIS — F17200 Nicotine dependence, unspecified, uncomplicated: Secondary | ICD-10-CM

## 2011-02-23 DIAGNOSIS — Z1231 Encounter for screening mammogram for malignant neoplasm of breast: Secondary | ICD-10-CM

## 2011-02-23 DIAGNOSIS — E785 Hyperlipidemia, unspecified: Secondary | ICD-10-CM

## 2011-02-23 DIAGNOSIS — Z720 Tobacco use: Secondary | ICD-10-CM

## 2011-02-23 DIAGNOSIS — I259 Chronic ischemic heart disease, unspecified: Secondary | ICD-10-CM

## 2011-02-23 DIAGNOSIS — I10 Essential (primary) hypertension: Secondary | ICD-10-CM

## 2011-02-23 LAB — LIPID PANEL
Cholesterol: 167 mg/dL (ref 0–200)
HDL: 49.2 mg/dL (ref >39.00)
Triglycerides: 89 mg/dL (ref 0.0–149.0)
VLDL: 17.8 mg/dL (ref 0.0–40.0)

## 2011-02-23 LAB — HEPATIC FUNCTION PANEL
ALT: 9 U/L (ref 0–35)
AST: 21 U/L (ref 0–37)
Albumin: 3.9 g/dL (ref 3.5–5.2)
Total Protein: 7.6 g/dL (ref 6.0–8.3)

## 2011-02-23 LAB — BASIC METABOLIC PANEL
Calcium: 9 mg/dL (ref 8.4–10.5)
GFR: 99.26 mL/min (ref >60.00)
Glucose, Bld: 82 mg/dL (ref 70–99)
Potassium: 3.9 mEq/L (ref 3.5–5.1)
Sodium: 141 mEq/L (ref 135–145)

## 2011-02-23 NOTE — Assessment & Plan Note (Signed)
She has a history of a left bundle branch block.  She has not had any dizzy spells or syncope or Stokes-Adams attacks.

## 2011-02-23 NOTE — Assessment & Plan Note (Signed)
The patient is concerned that the higher dose of Lipitor was causing her to have memory problems and so she decreased her Lipitor to just half of a tablet daily

## 2011-02-23 NOTE — Progress Notes (Signed)
Janit Bern Date of Birth:  [birth year] [facility] / [facility] [address]   [address] [place], [place]  [address] [phone]           Fax   [phone]  History of Present Illness: This pleasant 65 year old woman is seen for a scheduled followup office visit.  The patient has a history of known ischemic heart disease.  She had cardiac catheterization and PCI on 05/14/10.  She was found to have a high-grade lesion in the midportion of the right coronary artery.  Interestingly she had had a normal nuclear stress test 3 days prior to her PCI.  He was treated with 2 overlapping drug-eluting stents.  She is on Effient.  She has not been expressing any recurrent discomfort.  Current Outpatient Prescriptions  Medication Sig Dispense Refill  . amLODipine (NORVASC) 5 MG tablet TAKE ONE TABLET BY MOUTH EVERY DAY FOR BLOOD PRESSURE  30 tablet  3  . Ascorbic Acid (VITAMIN C) 500 MG tablet Take 250 mg by mouth daily.       [name] atorvastatin (LIPITOR) 20 MG tablet Take 20 mg by mouth daily. Taking 1/2 daily      . Cholecalciferol (VITAMIN D-3 PO) Take 1,000 mg by mouth daily.        . Multiple Vitamin (MULTIVITAMIN) tablet Take 1 tablet by mouth daily.        . nitroGLYCERIN (NITROSTAT) 0.4 MG SL tablet Place 0.4 mg under the tongue every 5 (five) minutes as needed.        . prasugrel (EFFIENT) 10 MG TABS Take 5 mg by mouth daily.         Allergies  Allergen Reactions  . Ezetimibe   . Isosorbide Swelling    SWELLING OF THE TONGUE  . Sulfonamide Derivatives     Patient Active Problem List  Diagnoses  . Anginal pain  . Coronary artery disease  . Nosebleed  . Hypertension  . History Of Hypercholesterolemia  . Left bundle-branch block  . Tobacco abuse  . Ischemic heart disease    History  Smoking status  . Former Smoker  . Quit date: 05/05/2010  Smokeless tobacco  . Not on file    History  Alcohol Use No    Family History  Problem Relation Age of Onset  .  Cancer Father   . Heart disease Father     Review of Systems: Constitutional: no fever chills diaphoresis or fatigue or change in weight.  Head and neck: no hearing loss, no epistaxis, no photophobia or visual disturbance. Respiratory: No cough, shortness of breath or wheezing. Cardiovascular: No chest pain peripheral edema, palpitations. Gastrointestinal: No abdominal distention, no abdominal pain, no change in bowel habits hematochezia or melena. Genitourinary: No dysuria, no frequency, no urgency, no nocturia. Musculoskeletal:No arthralgias, no back pain, no gait disturbance or myalgias. Neurological: No dizziness, no headaches, no numbness, no seizures, no syncope, no weakness, no tremors. Hematologic: No lymphadenopathy, no easy bruising. Psychiatric: No confusion, no hallucinations, no sleep disturbance.    Physical Exam: Filed Vitals:   02/23/11 0933  BP: 116/78  Pulse: 60   general appearance reveals a well-developed well-nourished woman in no distress.Pupils equal and reactive.   Extraocular Movements are full.  There is no scleral icterus.  The mouth and pharynx are normal.  The neck is supple.  The carotids reveal no bruits.  The jugular venous pressure is normal.  The thyroid is not enlarged.  There is no lymphadenopathy.  The  precordium is quiet.  The first heart sound is normal.  The second heart sound is physiologically split.  There is no murmur gallop rub or click.  There is no abnormal lift or heave.  The abdomen is soft and nontender. Bowel sounds are normal. The liver and spleen are not enlarged. There Are no abdominal masses. There are no bruits.  The pedal pulses are good.  There is no phlebitis or edema.  There is no cyanosis or clubbing. Strength is normal and symmetrical in all extremities.  There is no lateralizing weakness.  There are no sensory deficits.  The skin is warm and dry.  There is no rash.    Assessment / Plan: Continue same medication.  She  will continue on the lower dose of Effient.  She will recheck in 4 months for followup office visit and lab work and EKG

## 2011-02-23 NOTE — Assessment & Plan Note (Signed)
The patient has a history of essential hypertension.  Is on amlodipine she is tolerating well.  She is not having any peripheral edema from amlodipine.

## 2011-02-23 NOTE — Assessment & Plan Note (Signed)
The patient has drug-eluting stents which were placed on 05/14/10.  We would like to keep her on platelet inhibitors for at least a year.  Because of excessive bruising she has cut back on the dose of her Effient   to just one half tablet daily.    She is not on aspirin.  She has not been expressing any recurrent discomfort.

## 2011-02-23 NOTE — Assessment & Plan Note (Signed)
The patient has not smoked since she had her stents in January 2012.

## 2011-02-23 NOTE — Patient Instructions (Signed)
Your physician recommends that you continue on your current medications as directed. Please refer to the Current Medication list given to you today. Your physician recommends that you schedule a follow-up appointment in: 4 months with fasting labs and EKG

## 2011-02-25 ENCOUNTER — Telehealth: Payer: Self-pay | Admitting: *Deleted

## 2011-02-25 NOTE — Telephone Encounter (Signed)
Mailed copy of labs and left message if any questions to call

## 2011-02-25 NOTE — Telephone Encounter (Signed)
Message copied by Burnell Blanks on Fri Feb 25, 2011  4:50 PM ------      Message from: Cassell Clement      Created: Wed Feb 23, 2011  6:08 PM       Please report.  The labs are stable.  Continue same meds.  Continue careful diet.

## 2011-06-20 ENCOUNTER — Encounter: Payer: Self-pay | Admitting: Cardiology

## 2011-06-20 ENCOUNTER — Ambulatory Visit (INDEPENDENT_AMBULATORY_CARE_PROVIDER_SITE_OTHER): Payer: 59 | Admitting: Cardiology

## 2011-06-20 ENCOUNTER — Ambulatory Visit: Payer: 59 | Admitting: Cardiology

## 2011-06-20 ENCOUNTER — Other Ambulatory Visit (INDEPENDENT_AMBULATORY_CARE_PROVIDER_SITE_OTHER): Payer: 59

## 2011-06-20 VITALS — BP 118/70 | HR 50 | Ht 65.0 in | Wt 133.0 lb

## 2011-06-20 DIAGNOSIS — E7800 Pure hypercholesterolemia, unspecified: Secondary | ICD-10-CM

## 2011-06-20 DIAGNOSIS — I119 Hypertensive heart disease without heart failure: Secondary | ICD-10-CM

## 2011-06-20 DIAGNOSIS — I447 Left bundle-branch block, unspecified: Secondary | ICD-10-CM

## 2011-06-20 DIAGNOSIS — I259 Chronic ischemic heart disease, unspecified: Secondary | ICD-10-CM

## 2011-06-20 DIAGNOSIS — F17200 Nicotine dependence, unspecified, uncomplicated: Secondary | ICD-10-CM

## 2011-06-20 DIAGNOSIS — Z720 Tobacco use: Secondary | ICD-10-CM

## 2011-06-20 DIAGNOSIS — I2510 Atherosclerotic heart disease of native coronary artery without angina pectoris: Secondary | ICD-10-CM

## 2011-06-20 DIAGNOSIS — R040 Epistaxis: Secondary | ICD-10-CM

## 2011-06-20 LAB — HEPATIC FUNCTION PANEL
ALT: 11 U/L (ref 0–35)
AST: 17 U/L (ref 0–37)
Albumin: 3.8 g/dL (ref 3.5–5.2)
Total Protein: 7.3 g/dL (ref 6.0–8.3)

## 2011-06-20 LAB — BASIC METABOLIC PANEL
BUN: 12 mg/dL (ref 6–23)
Chloride: 108 mEq/L (ref 96–112)
Creatinine, Ser: 0.7 mg/dL (ref 0.4–1.2)
Glucose, Bld: 85 mg/dL (ref 70–99)
Potassium: 3.8 mEq/L (ref 3.5–5.1)

## 2011-06-20 LAB — LIPID PANEL
Cholesterol: 175 mg/dL (ref 0–200)
Triglycerides: 66 mg/dL (ref 0.0–149.0)

## 2011-06-20 NOTE — Assessment & Plan Note (Signed)
The patient has had no dizziness syncope or other symptoms from her left bundle branch block

## 2011-06-20 NOTE — Patient Instructions (Signed)
Will obtain labs today and call you with the results (LP/BMET/HFP) Your physician wants you to follow-up in: 4 months You will receive a reminder letter in the mail two months in advance. If you don't receive a letter, please call our office to schedule the follow-up appointment.

## 2011-06-20 NOTE — Progress Notes (Signed)
Quick Note:  Please report to patient. The recent labs are stable. Continue same medication and careful diet. LDL higher so watch diet carefully. ______

## 2011-06-20 NOTE — Assessment & Plan Note (Signed)
The patient has had no recurrence of epistaxis or other bleeding problems.

## 2011-06-20 NOTE — Assessment & Plan Note (Signed)
The patient has not been experiencing any recurrent chest pain or angina.  She has not been aware of any arrhythmia

## 2011-06-20 NOTE — Assessment & Plan Note (Signed)
The patient remains a nonsmoker.  Unfortunately her husband still smokes.

## 2011-06-20 NOTE — Progress Notes (Signed)
Katherine [name] Date of Birth:  02/24/1946 [facility] [address] [place], [place]  [address] [phone]         Fax   [phone]  History of Present Illness: This pleasant 66 year old woman is seen for a four-month followup office visit.  She has a history of known left bundle branch block.  She said known ischemic heart disease.  She had PCI on 05/14/10 at which time she was found to have a high-grade lesion in the midportion of the right coronary artery.  She had a normal nuclear stress test 3 days prior to her successful PCI.  She has 2 overlapping drug-eluting stents and is on Effient.  Current Outpatient Prescriptions  Medication Sig Dispense Refill  . amLODipine (NORVASC) 5 MG tablet TAKE ONE TABLET BY MOUTH EVERY DAY FOR BLOOD PRESSURE  30 tablet  3  . Ascorbic Acid (VITAMIN C) 500 MG tablet Take 250 mg by mouth daily.       [name] atorvastatin (LIPITOR) 20 MG tablet Take 20 mg by mouth daily. Taking 1/2 daily      . Cholecalciferol (VITAMIN D-3 PO) Take 1,000 mg by mouth daily.        . Multiple Vitamin (MULTIVITAMIN) tablet Take 1 tablet by mouth daily.        . nitroGLYCERIN (NITROSTAT) 0.4 MG SL tablet Place 0.4 mg under the tongue every 5 (five) minutes as needed.        . prasugrel (EFFIENT) 10 MG TABS Take 5 mg by mouth daily.         Allergies  Allergen Reactions  . Ezetimibe   . Isosorbide Swelling    SWELLING OF THE TONGUE  . Sulfonamide Derivatives     Patient Active Problem List  Diagnoses  . Anginal pain  . Coronary artery disease  . Nosebleed  . Hypertension  . History Of Hypercholesterolemia  . Left bundle-branch block  . Tobacco abuse  . Ischemic heart disease    History  Smoking status  . Former Smoker  . Quit date: 05/05/2010  Smokeless tobacco  . Not on file    History  Alcohol Use No    Family History  Problem Relation Age of Onset  . Cancer Father   . Heart disease Father     Review of  Systems: Constitutional: no fever chills diaphoresis or fatigue or change in weight.  Head and neck: no hearing loss, no epistaxis, no photophobia or visual disturbance. Respiratory: No cough, shortness of breath or wheezing. Cardiovascular: No chest pain peripheral edema, palpitations. Gastrointestinal: No abdominal distention, no abdominal pain, no change in bowel habits hematochezia or melena. Genitourinary: No dysuria, no frequency, no urgency, no nocturia. Musculoskeletal:No arthralgias, no back pain, no gait disturbance or myalgias. Neurological: No dizziness, no headaches, no numbness, no seizures, no syncope, no weakness, no tremors. Hematologic: No lymphadenopathy, no easy bruising. Psychiatric: No confusion, no hallucinations, no sleep disturbance.    Physical Exam: Filed Vitals:   06/20/11 0954  BP: 118/70  Pulse: 50   the general appearance reveals a well-developed well-nourished middle-aged woman in no distress.Pupils equal and reactive.   Extraocular Movements are full.  There is no scleral icterus.  The mouth and pharynx are normal.  The neck is supple.  The carotids reveal no bruits.  The jugular venous pressure is normal.  The thyroid is not enlarged.  There is no lymphadenopathy.  The chest is clear to percussion and auscultation. There are no rales or  rhonchi. Expansion of the chest is symmetrical.  The precordium is quiet.  The first heart sound is normal.  The second heart sound is physiologically split.  There is no  gallop rub or click.  She does have a soft systolic ejection murmur across the aortic valve.  No diastolic murmur. There is no abnormal lift or heave.The abdomen is soft and nontender. Bowel sounds are normal. The liver and spleen are not enlarged. There Are no abdominal masses. There are no bruits.  The pedal pulses are good.  There is no phlebitis or edema.  There is no cyanosis or clubbing. Strength is normal and symmetrical in all extremities.  There  is no lateralizing weakness.  There are no sensory deficits.  EKG today shows normal sinus rhythm and left bundle branch block with a normal axis     Assessment / Plan:  Continue same medication.  Recheck in 4 months for followup office visit.  Blood work today is pending.

## 2011-06-22 ENCOUNTER — Telehealth: Payer: Self-pay | Admitting: *Deleted

## 2011-06-22 NOTE — Telephone Encounter (Signed)
Message copied by Burnell Blanks on Wed Jun 22, 2011  9:12 AM ------      Message from: Cassell Clement      Created: Mon Jun 20, 2011  9:18 PM       Please report to patient.  The recent labs are stable. Continue same medication and careful diet. LDL higher so watch diet carefully.

## 2011-06-22 NOTE — Telephone Encounter (Signed)
Mailed copy of labs and left message to call if any questions  

## 2011-06-30 ENCOUNTER — Other Ambulatory Visit: Payer: Self-pay | Admitting: Cardiology

## 2011-06-30 MED ORDER — AMLODIPINE BESYLATE 5 MG PO TABS: 5.0000 mg | ORAL_TABLET | Freq: Every day | ORAL | Status: DC

## 2011-06-30 MED ORDER — ATORVASTATIN CALCIUM 20 MG PO TABS: 10.0000 mg | ORAL_TABLET | Freq: Every day | ORAL | Status: DC

## 2011-06-30 MED ORDER — PRASUGREL HCL 10 MG PO TABS: 5.0000 mg | ORAL_TABLET | Freq: Every day | ORAL | Status: DC

## 2011-06-30 NOTE — Telephone Encounter (Signed)
Refilled medications

## 2011-06-30 NOTE — Telephone Encounter (Signed)
New Msg: Pt would like 90 day supply of amlodipine, Lipitor, effient. Please call these RX's in.

## 2011-10-12 ENCOUNTER — Other Ambulatory Visit: Payer: 59

## 2011-10-19 ENCOUNTER — Other Ambulatory Visit: Payer: 59

## 2011-10-19 ENCOUNTER — Ambulatory Visit: Payer: 59 | Admitting: Cardiology

## 2011-10-28 ENCOUNTER — Other Ambulatory Visit: Payer: Self-pay | Admitting: Cardiology

## 2011-10-28 MED ORDER — PRASUGREL HCL 5 MG PO TABS: 5.0000 mg | ORAL_TABLET | Freq: Every day | ORAL | Status: DC

## 2011-10-28 NOTE — Telephone Encounter (Signed)
New msg °Pt wants to talk to you about her meds. Please call °

## 2011-10-31 ENCOUNTER — Telehealth: Payer: Self-pay | Admitting: Cardiology

## 2011-10-31 NOTE — Telephone Encounter (Signed)
New msg Pt wants to get a recommendation for pcp. Please call

## 2011-11-01 NOTE — Telephone Encounter (Signed)
Gave patient Dr Rene Paci name and number

## 2011-11-25 ENCOUNTER — Ambulatory Visit: Payer: 59 | Admitting: Cardiology

## 2012-01-27 IMAGING — CR DG CHEST 2V
2 series · 2 of 2 positions shown · non-contrast
Comparison: 03/06/2009

CLINICAL DATA: Left-sided pain, smoker.

CHEST - 2 VIEW

[w chest pa]
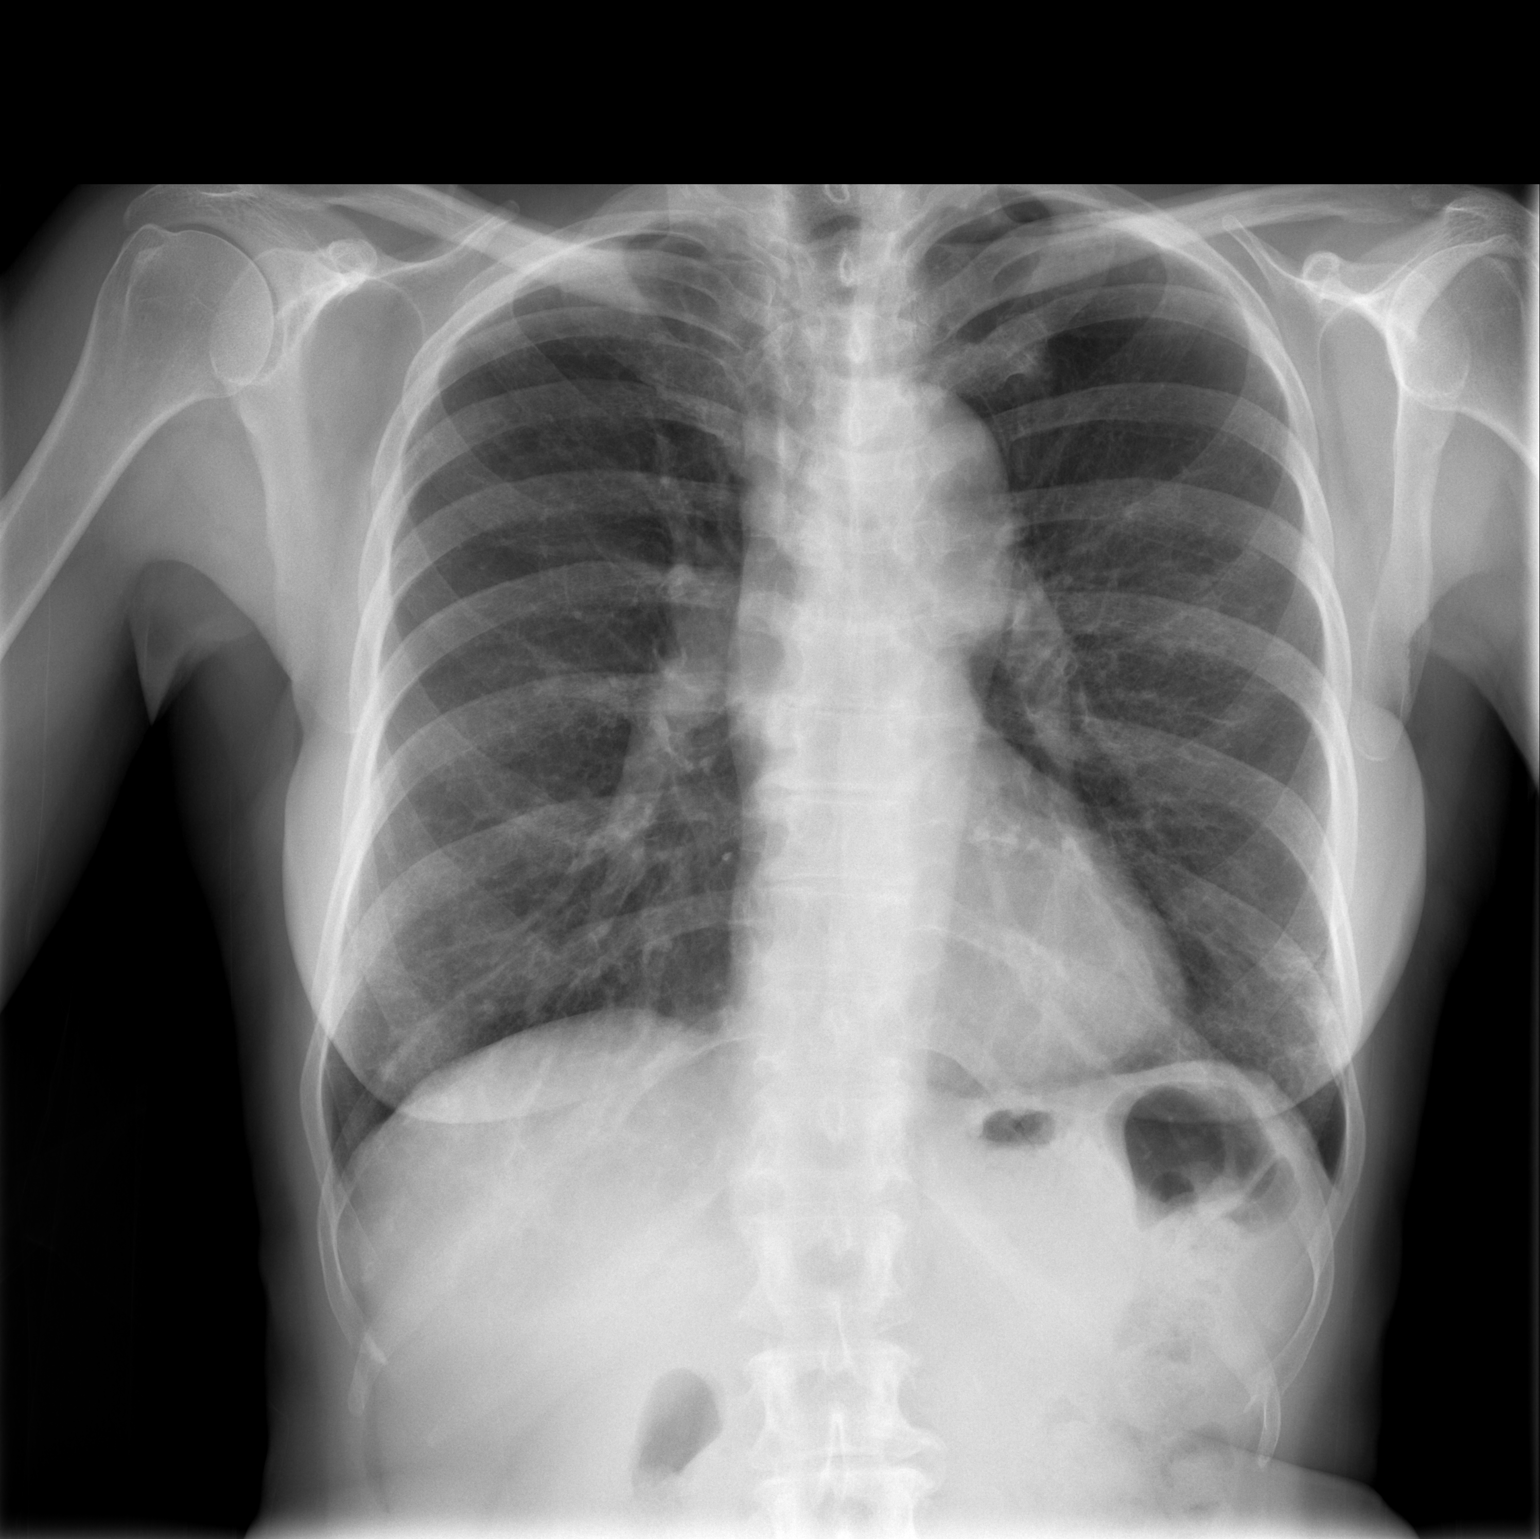

[w chest lat]
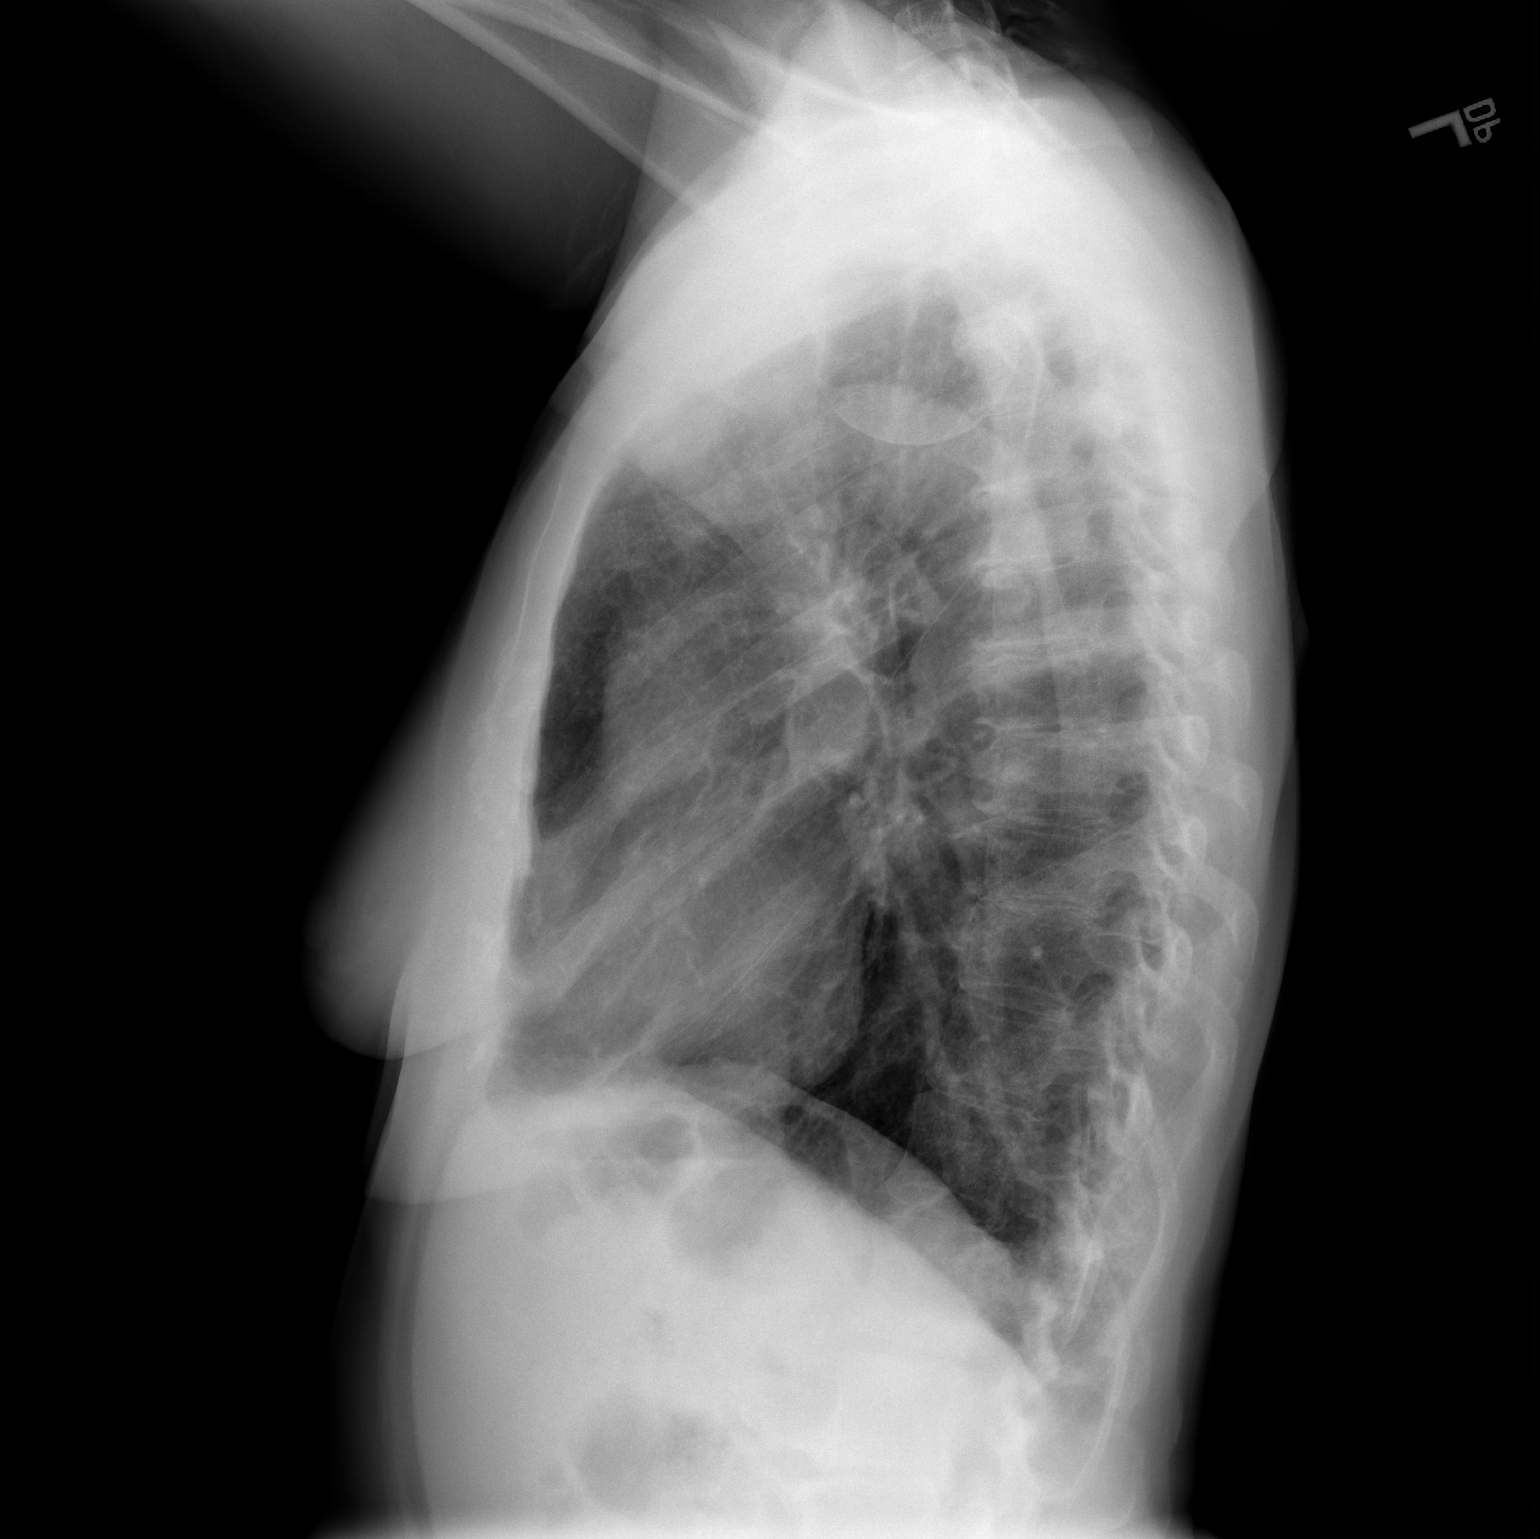

[2 of 2 positions shown; findings below may reference images not displayed]

FINDINGS: Heart and mediastinal contours are within normal limits.
No focal opacities or effusions.  No acute bony abnormality.
IMPRESSION: No acute cardiopulmonary disease.

## 2012-04-16 IMAGING — CR DG CHEST 1V PORT
1 series · 1 of 1 positions shown · non-contrast
Comparison: 02/15/2010 and 03/06/2009.

CLINICAL DATA: Chest pain and shortness of breath.

PORTABLE CHEST - 1 VIEW

[view not recorded]
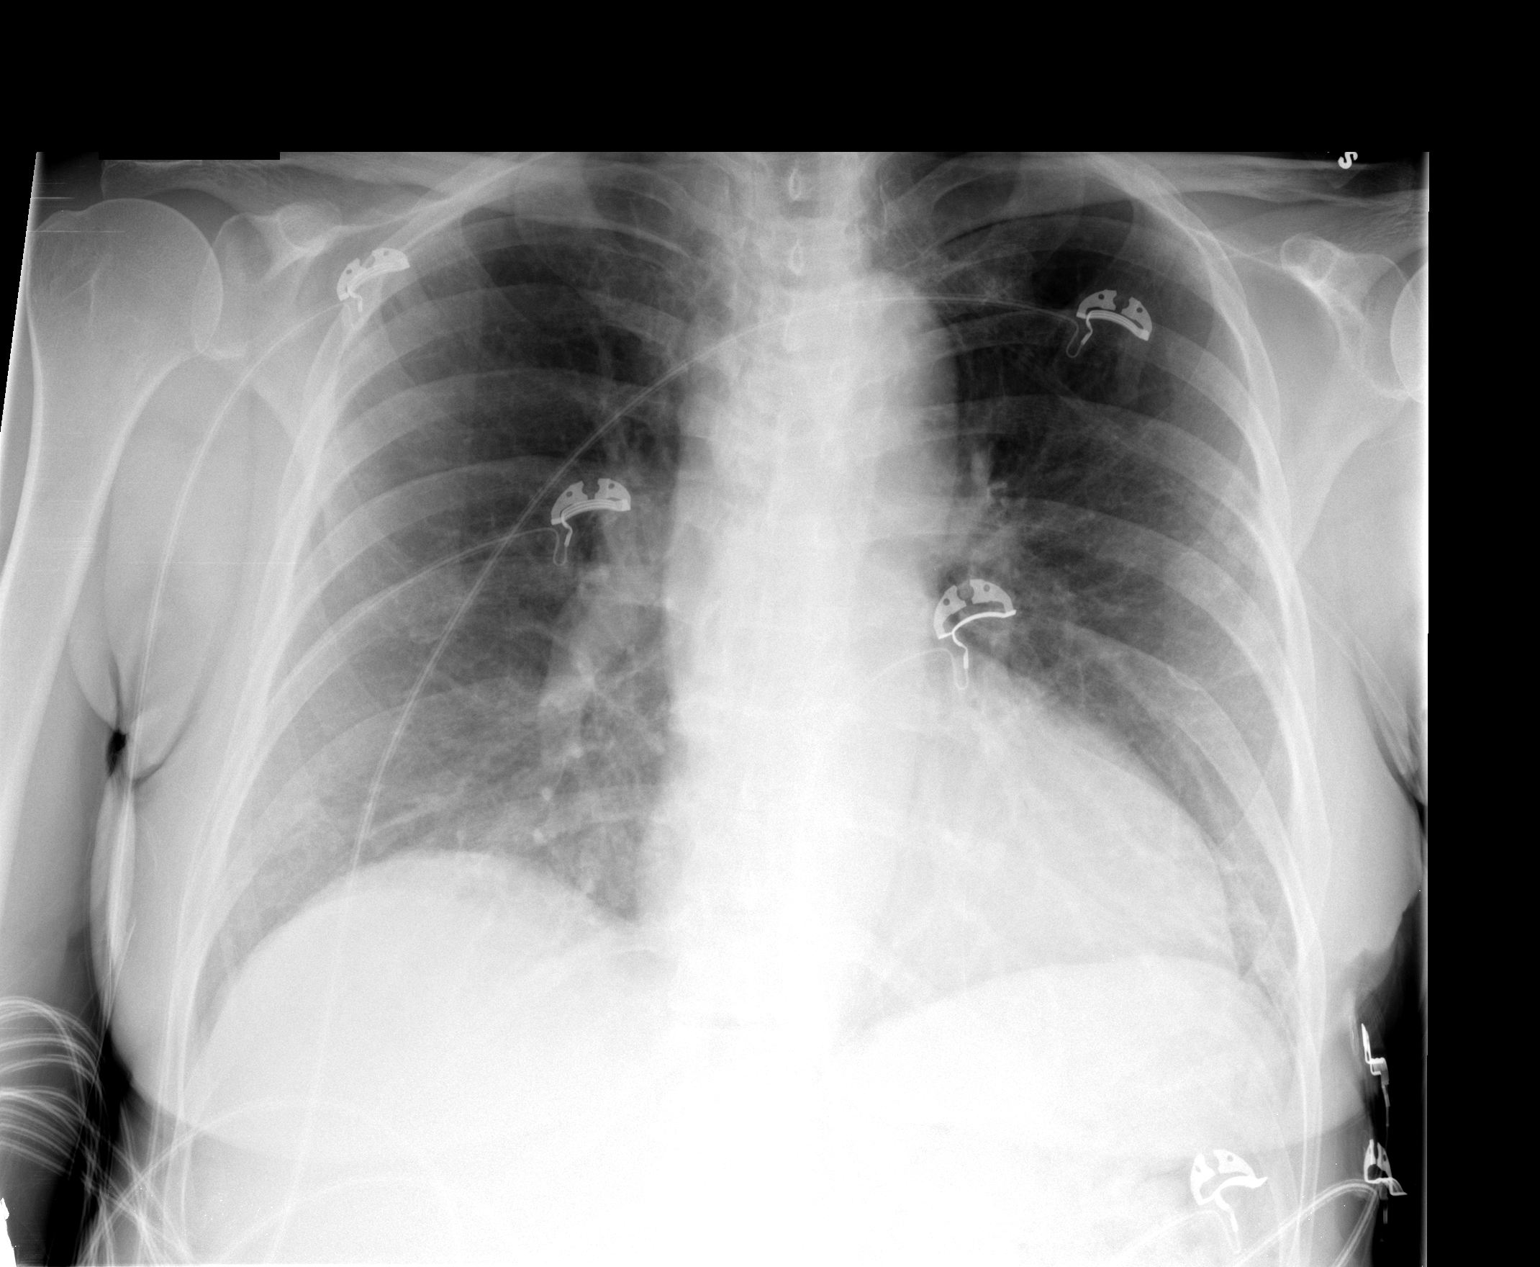

[1 of 1 positions shown; findings below may reference images not displayed]

FINDINGS: 6757 hours.  The heart size is stable for portable
technique.  There is mild aortic tortuosity.  There is mild
atelectasis in both lung bases.  There is no edema, confluent
airspace opacity or pleural effusion.
IMPRESSION: Mild basilar atelectasis.  No acute cardiopulmonary process.

## 2012-08-13 ENCOUNTER — Telehealth: Payer: Self-pay | Admitting: Cardiology

## 2012-08-13 DIAGNOSIS — E7800 Pure hypercholesterolemia, unspecified: Secondary | ICD-10-CM

## 2012-08-13 NOTE — Telephone Encounter (Signed)
Patient overdue for ov, scheduled appointment and labs

## 2012-08-13 NOTE — Telephone Encounter (Signed)
New problem    General questions

## 2012-08-29 ENCOUNTER — Ambulatory Visit: Payer: 59 | Admitting: Cardiology

## 2012-09-25 ENCOUNTER — Encounter: Payer: Self-pay | Admitting: Cardiology

## 2012-09-25 ENCOUNTER — Ambulatory Visit (INDEPENDENT_AMBULATORY_CARE_PROVIDER_SITE_OTHER): Payer: Medicare Other | Admitting: Cardiology

## 2012-09-25 VITALS — BP 124/62 | HR 46 | Ht 65.0 in | Wt 131.4 lb

## 2012-09-25 DIAGNOSIS — Z720 Tobacco use: Secondary | ICD-10-CM

## 2012-09-25 DIAGNOSIS — I259 Chronic ischemic heart disease, unspecified: Secondary | ICD-10-CM

## 2012-09-25 DIAGNOSIS — I10 Essential (primary) hypertension: Secondary | ICD-10-CM

## 2012-09-25 DIAGNOSIS — F17200 Nicotine dependence, unspecified, uncomplicated: Secondary | ICD-10-CM

## 2012-09-25 DIAGNOSIS — I493 Ventricular premature depolarization: Secondary | ICD-10-CM

## 2012-09-25 DIAGNOSIS — I447 Left bundle-branch block, unspecified: Secondary | ICD-10-CM

## 2012-09-25 DIAGNOSIS — I4949 Other premature depolarization: Secondary | ICD-10-CM

## 2012-09-25 MED ORDER — METOPROLOL SUCCINATE ER 25 MG PO TB24: ORAL_TABLET | ORAL | Status: DC

## 2012-09-25 MED ORDER — NITROGLYCERIN 0.4 MG SL SUBL: 0.4000 mg | SUBLINGUAL_TABLET | SUBLINGUAL | Status: DC | PRN

## 2012-09-25 NOTE — Assessment & Plan Note (Signed)
The patient has a history of known left bundle branch block.  She has not been having any dizzy spells or syncope.

## 2012-09-25 NOTE — Assessment & Plan Note (Signed)
She minimizes how many cigarettes she smokes daily.  I urged her to quit smoking altogether.  She notes that her husband is still a heavy smoker

## 2012-09-25 NOTE — Assessment & Plan Note (Signed)
Blood pressure today is slightly high.  She has been on amlodipine.  Continue amlodipine and we will and metoprolol succinate 12.5 mg daily to help with blood pressure and with PVCs

## 2012-09-25 NOTE — Assessment & Plan Note (Signed)
The patient has not been experiencing any recurrent chest pain or angina.  She is not having any symptoms of CHF

## 2012-09-25 NOTE — Assessment & Plan Note (Signed)
The patient has noted occasional palpitations.  EKG today shows that she is in normal sinus rhythm with bigeminy PVCs.  She is not on a beta blocker.  We will add low-dose metoprolol succinate 12.5 mg daily.

## 2012-09-25 NOTE — Patient Instructions (Addendum)
Start Toprol (Metoprolol) 25 mg 1/2 tablet daily  Your physician recommends that you schedule a follow-up appointment in: 3 month ov/ekg

## 2012-09-25 NOTE — Progress Notes (Signed)
Katherine [name] Date of Birth:  25-Nov-1945 [facility] [address] [place], [place]  [address] [phone]         Fax   [phone]  History of Present Illness: This pleasant 67 year old woman is seen after a long absence. She has a history of known left bundle branch block. She has had known ischemic heart disease. She had PCI on 05/14/10 at which time she was found to have a high-grade lesion in the midportion of the right coronary artery. She had a normal nuclear stress test 3 days prior to her successful PCI. She has 2 overlapping drug-eluting stents and was on Effient for a year and now is on 81 mg aspirin alone.  The patient continues to smoke against advice.  She has a remote history of pneumonia for which she was hospitalized.  Current Outpatient Prescriptions  Medication Sig Dispense Refill  . amLODipine (NORVASC) 5 MG tablet TAKE 1 TABLET DAILY  90 tablet  3  . aspirin 81 MG tablet Take 81 mg by mouth daily. 1 tab daily      . nitroGLYCERIN (NITROSTAT) 0.4 MG SL tablet Place 1 tablet (0.4 mg total) under the tongue every 5 (five) minutes as needed.  25 tablet  prn  . Ascorbic Acid (VITAMIN C) 500 MG tablet Take 250 mg by mouth daily.       . metoprolol succinate (TOPROL XL) 25 MG 24 hr tablet 1/2 tablet daily  30 tablet  5   No current facility-administered medications for this visit.    Allergies  Allergen Reactions  . Ezetimibe   . Isosorbide Swelling    SWELLING OF THE TONGUE  . Sulfonamide Derivatives     Patient Active Problem List   Diagnosis Date Noted  . Anginal pain     Priority: Medium  . Nosebleed     Priority: Medium  . History Of Hypercholesterolemia     Priority: Medium  . Tobacco abuse     Priority: Medium  . PVC's (premature ventricular contractions) 09/25/2012  . Coronary artery disease   . Hypertension   . Left bundle-branch block   . Ischemic heart disease     History  Smoking status  . Former Smoker  . Quit date:  05/05/2010  Smokeless tobacco  . Not on file    History  Alcohol Use No    Family History  Problem Relation Age of Onset  . Cancer Father   . Heart disease Father     Review of Systems: Constitutional: no fever chills diaphoresis or fatigue or change in weight.  Head and neck: no hearing loss, no epistaxis, no photophobia or visual disturbance. Respiratory: No cough, shortness of breath or wheezing. Cardiovascular: No chest pain peripheral edema, palpitations. Gastrointestinal: No abdominal distention, no abdominal pain, no change in bowel habits hematochezia or melena. Genitourinary: No dysuria, no frequency, no urgency, no nocturia. Musculoskeletal:No arthralgias, no back pain, no gait disturbance or myalgias. Neurological: No dizziness, no headaches, no numbness, no seizures, no syncope, no weakness, no tremors. Hematologic: No lymphadenopathy, no easy bruising. Psychiatric: No confusion, no hallucinations, no sleep disturbance.    Physical Exam: Filed Vitals:   09/25/12 1121  BP: 124/62  Pulse: 46   the general appearance reveals a well-developed well-nourished woman in no distress.The head and neck exam reveals pupils equal and reactive.  Extraocular movements are full.  There is no scleral icterus.  The mouth and pharynx are normal.  The neck is supple.  The carotids reveal no bruits.  The jugular venous pressure is normal.  The  thyroid is not enlarged.  There is no lymphadenopathy.  The chest is clear to percussion and auscultation.  There are no rales or rhonchi.  Expansion of the chest is symmetrical.  The precordium is quiet.  Pulse is regular with bigeminy PVCs.  The first heart sound is normal.  The second heart sound is physiologically split.  There is no murmur gallop rub or click.  There is no abnormal lift or heave.  The abdomen is soft and nontender.  The bowel sounds are normal.  The liver and spleen are not enlarged.  There are no abdominal masses.  There are no  abdominal bruits.  Extremities reveal good pedal pulses.  There is no phlebitis or edema.  There is no cyanosis or clubbing.  Strength is normal and symmetrical in all extremities.  There is no lateralizing weakness.  There are no sensory deficits.  The skin is warm and dry.  There is no rash.  EKG shows normal sinus rhythm with bigeminy PVCs.    She does have an old left bundle-branch block with secondary ST-T wave changes  Assessment / Plan: Continue same medication.  Add metoprolol succinate 12.5 mg daily.  Recheck 3 months for office visit and EKG

## 2012-10-15 ENCOUNTER — Telehealth: Payer: Self-pay | Admitting: Cardiology

## 2012-10-15 NOTE — Telephone Encounter (Signed)
New Problem   Pt wants to speak with you. She would not tell me what it was concerning.

## 2012-10-15 NOTE — Telephone Encounter (Signed)
Dizziness, lightheaded, diarrhea, tingling in fingers, and fatigue since starting the Metoprolol. Last day on Metoprolol was 6/26 but continues to have the symptoms, but better today. Will forward to  Dr. Patty Sermons for review

## 2012-10-15 NOTE — Telephone Encounter (Signed)
Continue to observe off of metoprolol.

## 2012-10-16 ENCOUNTER — Emergency Department (HOSPITAL_COMMUNITY): Payer: Medicare Other

## 2012-10-16 ENCOUNTER — Observation Stay (HOSPITAL_COMMUNITY)
Admission: EM | Admit: 2012-10-16 | Discharge: 2012-10-18 | Disposition: A | Discharge status: Home / Self Care | Payer: Medicare Other | Attending: Cardiology | Admitting: Cardiology

## 2012-10-16 ENCOUNTER — Encounter (HOSPITAL_COMMUNITY): Payer: Self-pay | Admitting: *Deleted

## 2012-10-16 ENCOUNTER — Other Ambulatory Visit: Payer: Self-pay | Admitting: *Deleted

## 2012-10-16 DIAGNOSIS — Z720 Tobacco use: Secondary | ICD-10-CM

## 2012-10-16 DIAGNOSIS — R42 Dizziness and giddiness: Secondary | ICD-10-CM | POA: Diagnosis present

## 2012-10-16 DIAGNOSIS — I447 Left bundle-branch block, unspecified: Secondary | ICD-10-CM | POA: Diagnosis present

## 2012-10-16 DIAGNOSIS — F17200 Nicotine dependence, unspecified, uncomplicated: Secondary | ICD-10-CM | POA: Insufficient documentation

## 2012-10-16 DIAGNOSIS — I4949 Other premature depolarization: Secondary | ICD-10-CM | POA: Insufficient documentation

## 2012-10-16 DIAGNOSIS — I10 Essential (primary) hypertension: Secondary | ICD-10-CM | POA: Insufficient documentation

## 2012-10-16 DIAGNOSIS — Z79899 Other long term (current) drug therapy: Secondary | ICD-10-CM | POA: Insufficient documentation

## 2012-10-16 DIAGNOSIS — I498 Other specified cardiac arrhythmias: Secondary | ICD-10-CM | POA: Insufficient documentation

## 2012-10-16 DIAGNOSIS — R001 Bradycardia, unspecified: Secondary | ICD-10-CM | POA: Diagnosis present

## 2012-10-16 DIAGNOSIS — I493 Ventricular premature depolarization: Secondary | ICD-10-CM | POA: Diagnosis present

## 2012-10-16 DIAGNOSIS — Z9861 Coronary angioplasty status: Secondary | ICD-10-CM | POA: Insufficient documentation

## 2012-10-16 DIAGNOSIS — R0602 Shortness of breath: Secondary | ICD-10-CM | POA: Insufficient documentation

## 2012-10-16 DIAGNOSIS — I119 Hypertensive heart disease without heart failure: Secondary | ICD-10-CM | POA: Diagnosis present

## 2012-10-16 DIAGNOSIS — R0789 Other chest pain: Principal | ICD-10-CM | POA: Insufficient documentation

## 2012-10-16 DIAGNOSIS — R079 Chest pain, unspecified: Secondary | ICD-10-CM | POA: Diagnosis present

## 2012-10-16 DIAGNOSIS — I2510 Atherosclerotic heart disease of native coronary artery without angina pectoris: Secondary | ICD-10-CM | POA: Diagnosis present

## 2012-10-16 HISTORY — DX: Monoclonal gammopathy: D47.2

## 2012-10-16 HISTORY — DX: Disorder of arteries and arterioles, unspecified: I77.9

## 2012-10-16 HISTORY — DX: Peripheral vascular disease, unspecified: I73.9

## 2012-10-16 HISTORY — DX: Ventricular premature depolarization: I49.3

## 2012-10-16 LAB — BASIC METABOLIC PANEL
BUN: 9 mg/dL (ref 6–23)
Chloride: 102 mEq/L (ref 96–112)
GFR calc Af Amer: >90 mL/min (ref >90)
GFR calc non Af Amer: 88 mL/min — ABNORMAL LOW (ref >90)
Potassium: 4.1 mEq/L (ref 3.5–5.1)
Sodium: 138 mEq/L (ref 135–145)

## 2012-10-16 LAB — TROPONIN I: Troponin I: <0.3 ng/mL (ref <0.30)

## 2012-10-16 LAB — HEPATIC FUNCTION PANEL
ALT: 6 U/L (ref 0–35)
AST: 14 U/L (ref 0–37)
Albumin: 3.2 g/dL — ABNORMAL LOW (ref 3.5–5.2)
Alkaline Phosphatase: 78 U/L (ref 39–117)
Total Protein: 7.5 g/dL (ref 6.0–8.3)

## 2012-10-16 LAB — CBC
RBC: 4.04 MIL/uL (ref 3.87–5.11)
RDW: 13.4 % (ref 11.5–15.5)

## 2012-10-16 LAB — POCT I-STAT TROPONIN I

## 2012-10-16 MED ORDER — VITAMIN C 250 MG PO TABS
250.0000 mg | ORAL_TABLET | Freq: Every day | ORAL | Status: DC
  Administered 2012-10-16 – 2012-10-17 (×2): 250 mg via ORAL
  Filled 2012-10-16 (×3): qty 1

## 2012-10-16 MED ORDER — SODIUM CHLORIDE 0.9 % IV SOLN: 250.0000 mL | INTRAVENOUS | Status: DC | PRN

## 2012-10-16 MED ORDER — ONDANSETRON HCL 4 MG/2ML IJ SOLN: 4.0000 mg | Freq: Four times a day (QID) | INTRAMUSCULAR | Status: DC | PRN

## 2012-10-16 MED ORDER — NITROGLYCERIN 0.4 MG SL SUBL
0.4000 mg | SUBLINGUAL_TABLET | SUBLINGUAL | Status: DC | PRN
Start: 2012-10-16 — End: 2012-10-18

## 2012-10-16 MED ORDER — HEPARIN SODIUM (PORCINE) 5000 UNIT/ML IJ SOLN
5000.0000 [IU] | Freq: Three times a day (TID) | INTRAMUSCULAR | Status: DC
  Administered 2012-10-16 – 2012-10-18 (×4): 5000 [IU] via SUBCUTANEOUS
  Filled 2012-10-16 (×8): qty 1

## 2012-10-16 MED ORDER — ASPIRIN EC 81 MG PO TBEC
81.0000 mg | DELAYED_RELEASE_TABLET | Freq: Every day | ORAL | Status: DC
  Filled 2012-10-16 (×2): qty 1

## 2012-10-16 MED ORDER — SODIUM CHLORIDE 0.9 % IJ SOLN
3.0000 mL | Freq: Two times a day (BID) | INTRAMUSCULAR | Status: DC
  Administered 2012-10-16: 3 mL via INTRAVENOUS

## 2012-10-16 MED ORDER — ACETAMINOPHEN 325 MG PO TABS: 650.0000 mg | ORAL_TABLET | ORAL | Status: DC | PRN

## 2012-10-16 MED ORDER — ASPIRIN 81 MG PO CHEW
324.0000 mg | CHEWABLE_TABLET | ORAL | Status: AC
  Administered 2012-10-17: 324 mg via ORAL
  Filled 2012-10-16: qty 4

## 2012-10-16 MED ORDER — SODIUM CHLORIDE 0.9 % IV SOLN
INTRAVENOUS | Status: DC
  Administered 2012-10-17: 04:00:00 via INTRAVENOUS

## 2012-10-16 MED ORDER — AMLODIPINE BESYLATE 5 MG PO TABS
5.0000 mg | ORAL_TABLET | Freq: Every day | ORAL | Status: DC
  Administered 2012-10-17: 5 mg via ORAL
  Filled 2012-10-16 (×2): qty 1

## 2012-10-16 MED ORDER — ASPIRIN 81 MG PO TABS: 81.0000 mg | ORAL_TABLET | Freq: Every day | ORAL | Status: DC

## 2012-10-16 MED ORDER — SODIUM CHLORIDE 0.9 % IJ SOLN: 3.0000 mL | INTRAMUSCULAR | Status: DC | PRN

## 2012-10-16 MED ORDER — ATORVASTATIN CALCIUM 10 MG PO TABS
10.0000 mg | ORAL_TABLET | Freq: Every day | ORAL | Status: DC
  Administered 2012-10-17: 10 mg via ORAL
  Filled 2012-10-16 (×2): qty 1

## 2012-10-16 MED ORDER — ASPIRIN 81 MG PO CHEW
324.0000 mg | CHEWABLE_TABLET | Freq: Once | ORAL | Status: AC
  Administered 2012-10-16: 324 mg via ORAL
  Filled 2012-10-16: qty 4

## 2012-10-16 NOTE — ED Notes (Addendum)
Pt reports stating a new medication(Metoprolol) per her cardiologist five days ago, pt states the very next day she started not feeling well, pt reports she felt heaviness and fatigue. Pt states she stopped taking the medication after two days. Pt states this morning she woke up and felt tightness and heaviness in her epigastric area/lower chest. Pt states she felt anxious and called her cardiologist and they informed her to come here. Pt denies any chest pain at this time. Pt denies chest tightness at this time. Pt reports she felt dizzy and lightheaded this morning also, but she does not feel this way at this time. Pt had a stent placed in March 2012.

## 2012-10-16 NOTE — ED Provider Notes (Addendum)
History    CSN: [id number] Arrival date & time 10/16/12  1130  First MD Initiated Contact with Patient 10/16/12 1224     Chief Complaint  Patient presents with  . Chest Pain   (Consider location/radiation/quality/duration/timing/severity/associated sxs/prior Treatment) HPI Comments: Patient presents with chest tightness. She has a history of coronary artery disease with 2 stents are placed 2 years ago. She saw Dr. Kennieth Rad he wears her cardiologist on June 10 for a routine checkup. At that time she was noted to have frequent PVCs and runs of bigeminy. She was started on metoprolol. She states ever since she started to metoprolol she's had feelings of increased fatigue. She's developed heaviness in her chest that has been intermittent and associated with some mild shortness of breath. She's had no cardiac evaluation as far as the stress test or catheterization since she had a stent put in 2 years ago. She denies a leg pain or swelling. She denies any history of a similar chest tightness in the past. She called her cardiologist's office and was told to come over here for evaluation. She currently denies any chest discomfort.  Patient is a 67 y.o. female presenting with chest pain.  Chest Pain Associated symptoms: shortness of breath   Associated symptoms: no abdominal pain, no back pain, no cough, no diaphoresis, no dizziness, no fatigue, no fever, no headache, no nausea, no numbness, not vomiting and no weakness    Past Medical History  Diagnosis Date  . Coronary artery disease     a. s/p overlapping DES to RCA 04/2010 (days after neg stress test). b. Cath 06/2010 - patent stents, nonobst dz otherwise.  [name] Nosebleed   . Hypertension   . History of hypercholesterolemia     Statin intolerant  . Left bundle-branch block   . Tobacco abuse   . Sinus bradycardia     a. In past prevented BB use.  . Monoclonal gammopathy of undetermined significance     a. Previously seen by heme - this is  per chart but pt does not recall.  [name] PVC's (premature ventricular contractions)   . Carotid artery disease     a. Hx r carotid bruit - dopplers 2009 <50% bilat.   Past Surgical History  Procedure Laterality Date  . Pci      HIGH GRADE LESION IN THE MIDPORTION OF THE RIGHT CORONARY ARTERY WHICH WAS A LARGE ARTERY  . Cardiac catheterization  05/14/10    SHOWED EVIDENCE OF REVERSIBLE VASOSPASM   Family History  Problem Relation Age of Onset  . Cancer Father   . Heart disease Father    History  Substance Use Topics  . Smoking status: Current Every Day Smoker  . Smokeless tobacco: Not on file     Comment: Smoking for over 45 years  . Alcohol Use: No   OB History   Grav Para Term Preterm Abortions TAB SAB Ect Mult Living                 Review of Systems  Constitutional: Negative for fever, chills, diaphoresis and fatigue.  HENT: Negative for congestion, rhinorrhea and sneezing.   Eyes: Negative.   Respiratory: Positive for shortness of breath. Negative for cough and chest tightness.   Cardiovascular: Positive for chest pain. Negative for leg swelling.  Gastrointestinal: Negative for nausea, vomiting, abdominal pain, diarrhea and blood in stool.  Genitourinary: Negative for frequency, hematuria, flank pain and difficulty urinating.  Musculoskeletal: Negative for back pain and arthralgias.  Skin: Negative for rash.  Neurological: Negative for dizziness, speech difficulty, weakness, numbness and headaches.    Allergies  Ezetimibe; Isosorbide; Metoprolol succinate; Plavix; Sulfa antibiotics; Sulfonamide derivatives; and Zocor  Home Medications   Current Outpatient Rx  Name  Route  Sig  Dispense  Refill  . amLODipine (NORVASC) 5 MG tablet   Oral   Take 5 mg by mouth daily.         . Ascorbic Acid (VITAMIN C) 500 MG tablet   Oral   Take 250 mg by mouth daily.          [name] aspirin 81 MG tablet   Oral   Take 81 mg by mouth daily.          . nitroGLYCERIN  (NITROSTAT) 0.4 MG SL tablet   Sublingual   Place 1 tablet (0.4 mg total) under the tongue every 5 (five) minutes as needed.   25 tablet   prn    BP 131/55  Pulse 52  Temp(Src) 98.6 F (37 C) (Oral)  Resp 18  SpO2 99% Physical Exam  Constitutional: She is oriented to person, place, and time. She appears well-developed and well-nourished.  HENT:  Head: Normocephalic and atraumatic.  Eyes: Pupils are equal, round, and reactive to light.  Neck: Normal range of motion. Neck supple.  Cardiovascular: Normal rate, regular rhythm and normal heart sounds.   Pulmonary/Chest: Effort normal and breath sounds normal. No respiratory distress. She has no wheezes. She has no rales. She exhibits no tenderness.  Abdominal: Soft. Bowel sounds are normal. There is no tenderness. There is no rebound and no guarding.  Musculoskeletal: Normal range of motion. She exhibits no edema and no tenderness.  Lymphadenopathy:    She has no cervical adenopathy.  Neurological: She is alert and oriented to person, place, and time.  Skin: Skin is warm and dry. No rash noted.  Psychiatric: She has a normal mood and affect.    ED Course  Procedures (including critical care time)  Results for orders placed during the hospital encounter of 10/16/12  CBC      Result Value Range   WBC 6.8  4.0 - 10.5 K/uL   RBC 4.04  3.87 - 5.11 MIL/uL   Hemoglobin 12.7  12.0 - 15.0 g/dL   HCT 47.8  29.5 - 62.1 %   MCV 93.1  78.0 - 100.0 fL   MCH 31.4  26.0 - 34.0 pg   MCHC 33.8  30.0 - 36.0 g/dL   RDW 30.8  65.7 - 84.6 %   Platelets 433 (*) 150 - 400 K/uL  BASIC METABOLIC PANEL      Result Value Range   Sodium 138  135 - 145 mEq/L   Potassium 4.1  3.5 - 5.1 mEq/L   Chloride 102  96 - 112 mEq/L   CO2 24  19 - 32 mEq/L   Glucose, Bld 92  70 - 99 mg/dL   BUN 9  6 - 23 mg/dL   Creatinine, Ser 9.62  0.50 - 1.10 mg/dL   Calcium 9.6  8.4 - 95.2 mg/dL   GFR calc non Af Amer 88 (*) >90 mL/min   GFR calc Af Amer >90  >90 mL/min   POCT I-STAT TROPONIN I      Result Value Range   Troponin i, poc 0.00  0.00 - 0.08 ng/mL   Comment 3            No results found.    Date: 10/16/2012  Rate: 73  Rhythm: normal sinus rhythm  QRS Axis: right  Intervals: normal  ST/T Wave abnormalities: nonspecific ST/T changes  Conduction Disutrbances:none  Narrative Interpretation:   Old EKG Reviewed: changes notedslight increase in ST depression inferior/laterally   No results found. 1. Chest pain     MDM  notifed Alexander cards will see pt.      Rolan Bucco, MD 10/16/12 1408  Rolan Bucco, MD 10/16/12 [phone]

## 2012-10-16 NOTE — H&P (Signed)
History and Physical  Patient ID: Katherine [name] MRN: [id number], DOB: 1946/04/03 Date of Encounter: 10/16/2012, 2:27 PM Primary Physician: Bing Plume, MD Primary Cardiologist: Patty Sermons  Chief Complaint: dizziness, chest heaviness Reason for Admission: dizziness, chest heaviness  HPI: [name] is a 67 y/o M with history of HTN, HL, LBBB, CAD s/p 2 DES to RCA 04/2010 (days after a normal nuc; also had nonobstructive OM disease) and tobacco abuse who presented to [facility] today with chest pain. She was recently seen in the office 09/25/12 after a long absence. EKG showed NSR with ventricular bigeminy thus low dose Toprol 12.5mg  was added. She did not pick this medicine up until several days later. After 1 dose, she developed tingling in her hands. Her husband researched side effects online and she became very concerned when reading the side effects. She tried to continue on the medicine for the next several days but also noticed pain in her legs, stomach, and back that lasted several days. She discontinued this medication 2 days ago. However, this morning, she woke up and felt weak, tired, and dizzy, as if she was going to pass out. She denies any visual changes, LOC, or palpitations. She then developed a "funny chest pressure" substernally associated with SOB. No nausea or vomiting. It was not made worse with exertion, inspiration or movement. She drank water without relief. She did not take NTG. She has never had this discomfort before. By the time she came to the ER, the pain had subsided. It lasted about 20 minutes. She denies any melena, hematemesis, BRBPR, cough, fever, chills, weight changes or diaphoresis. In the ER, BP is stable. Tele has shown SB with HR in the 50's, occasionally dipping as low as 43. She received 324mg  ASA in the ER and is resting comfortably.   Of note, she has h/o high sensitivity to medications. She reports easy bruising "all over my body" while on Effient in the  past after her stent (has h/o Plavix hyporesponsiveness). She can only tolerate a low dose of lipitor due to h/o muscle aches and memory loss. She jokes that if you aren't going to die from the illness, you'll die from the medications that treat it. She continues to smoke for over 45 years and states she is influenced by her husband smoking. Labs nonacute including troponin neg x 1. CXR pending.  Past Medical History  Diagnosis Date  . Coronary artery disease     a. s/p overlapping DES to RCA 04/2010 (days after neg stress test). b. Cath 06/2010 - patent stents, nonobst dz otherwise.  [name] Nosebleed   . Hypertension   . History of hypercholesterolemia   . Left bundle-branch block   . Tobacco abuse   . Sinus bradycardia     a. In past prevented BB use.  . Monoclonal gammopathy of undetermined significance     a. Previously seen by heme.  [name] PVC's (premature ventricular contractions)   . Carotid artery disease     a. Hx r carotid bruit - dopplers 2009 <50% bilat.     Most Recent Cardiac Studies: Cath 06/2010 PROCEDURE NOTE: Left heart catheterization performed in the right  femoral artery. The artery was cannulated using the anterior wall  puncture. A #5-French arterial sheath was inserted via the right  Seldinger technique. Preformed Judkins and pigtail catheter were  utilized. The patient tolerated the procedure well and left the lab in  stable condition.  RESULTS: Hemodynamics LV 140/4, AO 148/76. Coronaries, left  main was  normal. The LAD had proximal 25% stenosis. There was a diagonal which  was very large with luminal irregularities. The circumflex in the AV  groove was normal. There was a large mid obtuse marginal with long  proximal to mid 50-60% stenosis. This was unchanged from previous. The  right coronary artery is a large dominant vessel. There was a proximal  stent with some in-stent luminal irregularities but was otherwise widely  patent. PDA was large and normal. Left  ventriculogram, the left  ventriculogram was obtained in the RAO projection. The EF was 65% with  normal wall motion.  CONCLUSION: Patent right coronary artery stent. Residual  nonobstructive disease elsewhere.  PLAN: Continue aggressive medical management.    Surgical History:  Past Surgical History  Procedure Laterality Date  . Pci      HIGH GRADE LESION IN THE MIDPORTION OF THE RIGHT CORONARY ARTERY WHICH WAS A LARGE ARTERY  . Cardiac catheterization  05/14/10    SHOWED EVIDENCE OF REVERSIBLE VASOSPASM     Home Meds: Prior to Admission medications   Medication Sig Start Date End Date Taking? Authorizing Provider  amLODipine (NORVASC) 5 MG tablet Take 5 mg by mouth daily.   Yes Historical Provider, MD  Ascorbic Acid (VITAMIN C) 500 MG tablet Take 250 mg by mouth daily.    Yes Historical Provider, MD  aspirin 81 MG tablet Take 81 mg by mouth daily.    Yes Historical Provider, MD  nitroGLYCERIN (NITROSTAT) 0.4 MG SL tablet Place 1 tablet (0.4 mg total) under the tongue every 5 (five) minutes as needed. 09/25/12  Yes Cassell Clement, MD  Lipitor - unclear dose. Have asked pharmacy to clarify.  Allergies:  Allergies  Allergen Reactions  . Ezetimibe   . Isosorbide Swelling    SWELLING OF THE TONGUE  . Metoprolol Succinate (Metoprolol)     Dizziness, diarrhea, tingling in fingers  . Plavix (Clopidogrel Bisulfate)     Prior P2Y12 showing 267 (poor Plavix responsiveness)  . Sulfa Antibiotics     Sulfa drugs as a child  . Sulfonamide Derivatives     Sulfa drugs as a child  . Zocor (Simvastatin)     Leg pain     History   Social History  . Marital Status: Married    Spouse Name: N/A    Number of Children: N/A  . Years of Education: N/A   Occupational History  . Not on file.   Social History Main Topics  . Smoking status: Current Every Day Smoker    Last Attempt to Quit: 05/05/2010  . Smokeless tobacco: Not on file  . Alcohol Use: 1.2 oz/week    2 Glasses of wine per  week  . Drug Use: No  . Sexually Active: Not on file   Other Topics Concern  . Not on file   Social History Narrative  . No narrative on file     Family History  Problem Relation Age of Onset  . Cancer Father   . Heart disease Father     Review of Systems: General: negative for chills, fever, night sweats or weight changes.  Cardiovascular: negative for edema, orthopnea, palpitations, paroxysmal nocturnal dyspnea Dermatological: negative for rash Respiratory: negative for cough or wheezing Urologic: negative for hematuria Abdominal: negative for nausea, vomiting, diarrhea, bright red blood per rectum, melena, or hematemesis Neurologic: negative for visual changes, syncope All other systems reviewed and are otherwise negative except as noted above.  Labs:   Lab Results  Component Value Date   WBC 6.8 10/16/2012   HGB 12.7 10/16/2012   HCT 37.6 10/16/2012   MCV 93.1 10/16/2012   PLT 433* 10/16/2012    Recent Labs Lab 10/16/12 1208  NA 138  K 4.1  CL 102  CO2 24  BUN 9  CREATININE 0.73  CALCIUM 9.6  GLUCOSE 92   POC troponin neg x 1 Lab Results  Component Value Date   CHOL 175 06/20/2011   HDL 50.50 06/20/2011   LDLCALC 111* 06/20/2011   TRIG 66.0 06/20/2011    Radiology/Studies: No results found.   EKG: NSR 73bpm, LBBB. downsloping T wave change III, v6 Previous bigeminy from 09/25/12 resolved EKG does not appear significantly different than 06/2011 EKG  Physical Exam: Blood pressure 131/55, pulse 52, temperature 98.6 F (37 C), temperature source Oral, resp. rate 18, SpO2 99.00%. General: Well developed, well nourished WF in no acute distress. Head: Normocephalic, atraumatic, sclera non-icteric, no xanthomas, nares are without discharge.  Neck: Faint R carotid bruits. JVD not elevated. Lungs: Coarse bibasilar BS. No wheezes or rhonchi. Breathing is unlabored. Heart: Reg rhythm slightly slow rate with S1 S2. 2/6 short SEM heard best at RUSB. No rubs or gallops  appreciated. Abdomen: Soft, non-tender, non-distended with normoactive bowel sounds. No hepatomegaly. No rebound/guarding. No obvious abdominal masses. Msk:  Strength and tone appear normal for age. Extremities: No clubbing or cyanosis. No edema.  Distal pedal pulses are 2+ and equal bilaterally. Neuro: Alert and oriented X 3. No focal deficit. No facial asymmetry. Moves all extremities spontaneously. Psych:  Responds to questions appropriately with a normal affect.    ASSESSMENT AND PLAN:  1. Chest pain / dizziness - some symptoms are concerning for [place]. Nuclear stress testing has been unhelpful in the past as she went on to have stenting several days after a normal study. Per discussion with Dr. Patty Sermons, he would like to proceed with cath in AM. Hold off on anticoag unless enzymes turn positive. 2. CAD s/p RCA stent x 2 in 2012 - continue ASA. Avoid BB. Clarify statin dose. See above. 3. Sinus bradycardia - the degree to which this is contributing to her symptoms is unclear. HR got as low as 43 while resting quietly in the ER while being interviewed although she was acutely asymptomatic at that time. Check TSH and free T4. Avoid beta blockers in the future.  4. Systolic murmur - check echo. 5. R carotid bruit - check dopplers. 6. Abnormal lung sounds with longstanding tobacco abuse - CXR showing chronic interstitial disease. Strongly advised cessation of tobacco. 7. HLD, only tolerates low dose lipitor - pt does not recall home dose. Awaiting dose clarification by pharmacy. 8. HTN - controlled.  Signed, Ronie Spies PA-C 10/16/2012, 2:27 PM  Patient known to me.  Seen in ER with Ronie Spies, PA-C. Her history is somewhat difficult to decipher. She told me (husband now present to confirm dates) that she has been off her Toprol since last  Thursday (5 days ago) but has continued to have marked fatigue.  Today had about 20 minutes of sustained chest pressure at rest. EKG is nonhelpful because of  LBBB. As noted, in the past nuclear stress testing was not helpful as she went on to have stenting of her RCA several days after a normal nuclear stress test. Will plan on cardiac cath in am. Spoke to her again about the need to stop smoking.  She has fine fibrotic rales at bases and some subtle changes  of pulmonary fibrosis on xray.

## 2012-10-16 NOTE — ED Notes (Signed)
Pt resting quietly at the time. Vital signs stable. Denies chest pain. Remains on cardiac monitor.

## 2012-10-16 NOTE — ED Notes (Signed)
PT has known cardiac history with stents.  Pt started new medicine about 5 days ago and it made her feel bad and she has not taken it in 2 days.  Pt had some heaviness in her chest and had numbness in her fingers.

## 2012-10-16 NOTE — ED Notes (Signed)
The medicine she started was metoprolol.

## 2012-10-16 NOTE — Telephone Encounter (Signed)
Spoke with the patient to give her recommendation from  Dr. Patty Sermons and she stated she had just gotten up and was feeling dizzy and having heaviness in her chest. Was having the dizziness last night before bed and did not sleep well. After speaking with the patient at great length she seemed to be feeling a little better and heaviness had subsided. Patient denied any pain, is unable to check her blood pressure at home. Patient was going to get ready and  go somewhere and get blood pressure check. I was going to speak with PA/NP for questionable ov but before I could do so patient called back. She had spoken with her husband and he was on his way home to take her somewhere to be seen. Before I could even discuss with patient she stated she just needed to go lay down that she just didn't feel good. When I asked the patient how she was feeling she stated she was just really tired and didn't feel good. Discussed with  Dr. Patty Sermons and he recommended patient going to ED. When I suggested to patient she didn't know what she was going to do. Patient did say that she had been having the chest pressure off and on. Patient feels that  none of her symptoms started until after she started the Metoprolol, which she d/c 6/26. Did strongly encourage patient that she needed to go to ED for evaluation. Patient stated she would discuss with her husband when he got home. Explained again with her history of CAD and having the symptoms she was having that she needed to go to ED.

## 2012-10-17 ENCOUNTER — Encounter (HOSPITAL_COMMUNITY)
Admission: EM | Disposition: A | Discharge status: Home / Self Care | Payer: Self-pay | Source: Home / Self Care | Attending: Emergency Medicine

## 2012-10-17 DIAGNOSIS — I495 Sick sinus syndrome: Secondary | ICD-10-CM

## 2012-10-17 DIAGNOSIS — R011 Cardiac murmur, unspecified: Secondary | ICD-10-CM

## 2012-10-17 DIAGNOSIS — I2510 Atherosclerotic heart disease of native coronary artery without angina pectoris: Secondary | ICD-10-CM

## 2012-10-17 HISTORY — PX: LEFT HEART CATHETERIZATION WITH CORONARY ANGIOGRAM: SHX5451

## 2012-10-17 LAB — LIPID PANEL
Cholesterol: 181 mg/dL (ref 0–200)
Total CHOL/HDL Ratio: 5.5 RATIO
Triglycerides: 108 mg/dL (ref <150)
VLDL: 22 mg/dL (ref 0–40)

## 2012-10-17 LAB — BASIC METABOLIC PANEL
BUN: 10 mg/dL (ref 6–23)
Chloride: 105 mEq/L (ref 96–112)
Glucose, Bld: 95 mg/dL (ref 70–99)
Potassium: 4.2 mEq/L (ref 3.5–5.1)

## 2012-10-17 LAB — CBC
HCT: 35.5 % — ABNORMAL LOW (ref 36.0–46.0)
Hemoglobin: 11.9 g/dL — ABNORMAL LOW (ref 12.0–15.0)
WBC: 7.2 10*3/uL (ref 4.0–10.5)

## 2012-10-17 SURGERY — LEFT HEART CATHETERIZATION WITH CORONARY ANGIOGRAM
Anesthesia: LOCAL

## 2012-10-17 MED ORDER — MIDAZOLAM HCL 2 MG/2ML IJ SOLN
INTRAMUSCULAR | Status: AC
  Filled 2012-10-17: qty 2

## 2012-10-17 MED ORDER — HEPARIN SODIUM (PORCINE) 1000 UNIT/ML IJ SOLN
INTRAMUSCULAR | Status: AC
  Filled 2012-10-17: qty 1

## 2012-10-17 MED ORDER — SODIUM CHLORIDE 0.9 % IV SOLN: INTRAVENOUS | Status: AC

## 2012-10-17 MED ORDER — FENTANYL CITRATE 0.05 MG/ML IJ SOLN
INTRAMUSCULAR | Status: AC
  Filled 2012-10-17: qty 2

## 2012-10-17 MED ORDER — LIDOCAINE HCL (PF) 1 % IJ SOLN
INTRAMUSCULAR | Status: AC
  Filled 2012-10-17: qty 30

## 2012-10-17 MED ORDER — HEPARIN (PORCINE) IN NACL 2-0.9 UNIT/ML-% IJ SOLN
INTRAMUSCULAR | Status: AC
  Filled 2012-10-17: qty 1000

## 2012-10-17 NOTE — H&P (View-Only) (Signed)
   Subjective:  Denies CP or dyspnea   Objective:  Filed Vitals:   10/16/12 1630 10/16/12 1713 10/16/12 2110 10/17/12 0515  BP:  154/69 130/72 137/66  Pulse: 57 58 64 46  Temp:  97.4 F (36.3 C) 98.6 F (37 C) 98.3 F (36.8 C)  TempSrc:  Oral Oral Oral  Resp: 19 18 18 18   Height:  5\' 5"  (1.651 m)    Weight:  127 lb 11.2 oz (57.924 kg)    SpO2: 98% 99% 94% 95%    Intake/Output from previous day:  Intake/Output Summary (Last 24 hours) at 10/17/12 0454 Last data filed at 10/16/12 1814  Gross per 24 hour  Intake    120 ml  Output      0 ml  Net    120 ml    Physical Exam: Physical exam: Well-developed well-nourished in no acute distress.  Skin is warm and dry.  HEENT is normal.  Neck is supple. No thyromegaly.  Chest is clear to auscultation with normal expansion.  Cardiovascular exam is regular rate and rhythm.  Abdominal exam nontender or distended. No masses palpated. Extremities show no edema. neuro grossly intact    Lab Results: Basic Metabolic Panel:  Recent Labs  09/81/19 1208 10/17/12 0620  NA 138 140  K 4.1 4.2  CL 102 105  CO2 24 26  GLUCOSE 92 95  BUN 9 10  CREATININE 0.73 0.65  CALCIUM 9.6 9.0   CBC:  Recent Labs  10/16/12 1208 10/17/12 0620  WBC 6.8 7.2  HGB 12.7 11.9*  HCT 37.6 35.5*  MCV 93.1 92.7  PLT 433* 401*   Cardiac Enzymes:  Recent Labs  10/16/12 1640 10/16/12 2237 10/17/12 0625  TROPONINI <0.30 <0.30 <0.30     Assessment/Plan:  1. Chest pain / dizziness - Per discussion with Dr. Patty Sermons, he would like to proceed with cath; Risks and benefits discussed and patient agrees to proceed. Note enzymes negative. 2. CAD s/p RCA stent x 2 in 2012 - continue ASA and statin. Avoid BB given bradycardia.  3. Sinus bradycardia - the degree to which this is contributing to her symptoms is unclear. Check TSH and free T4. Avoid beta blockers in the future.  4. Systolic murmur - check echo.  5. R carotid bruit - check dopplers  (could be done as outpt).  6. Abnormal lung sounds with longstanding tobacco abuse - CXR showing chronic interstitial disease. Strongly advised cessation of tobacco.  7. HLD, only tolerates low dose lipitor - continue 8. HTN - controlled.   Olga Millers 10/17/2012, 8:52 AM

## 2012-10-17 NOTE — Progress Notes (Signed)
Radial band removed, pressure dressing applied; positive allens

## 2012-10-17 NOTE — Progress Notes (Signed)
Attempted to withdraw 3cc of air from band, pt immideately started bleeding; 3cc air added back. Will try again in 30 mins

## 2012-10-17 NOTE — Interval H&P Note (Signed)
History and Physical Interval Note:  10/17/2012 3:22 PM  Katherine [name]  has presented today for surgery, with the diagnosis of Chest pain  The various methods of treatment have been discussed with the patient and family. After consideration of risks, benefits and other options for treatment, the patient has consented to  Procedure(s): LEFT HEART CATHETERIZATION WITH CORONARY ANGIOGRAM (N/A) as a surgical intervention .  The patient's history has been reviewed, patient examined, no change in status, stable for surgery.  I have reviewed the patient's chart and labs.  Questions were answered to the patient's satisfaction.     [name]

## 2012-10-17 NOTE — Progress Notes (Signed)
   Subjective:  Denies CP or dyspnea   Objective:  Filed Vitals:   10/16/12 1630 10/16/12 1713 10/16/12 2110 10/17/12 0515  BP:  154/69 130/72 137/66  Pulse: 57 58 64 46  Temp:  97.4 F (36.3 C) 98.6 F (37 C) 98.3 F (36.8 C)  TempSrc:  Oral Oral Oral  Resp: 19 18 18 18  Height:  5' 5" (1.651 m)    Weight:  127 lb 11.2 oz (57.924 kg)    SpO2: 98% 99% 94% 95%    Intake/Output from previous day:  Intake/Output Summary (Last 24 hours) at 10/17/12 0852 Last data filed at 10/16/12 1814  Gross per 24 hour  Intake    120 ml  Output      0 ml  Net    120 ml    Physical Exam: Physical exam: Well-developed well-nourished in no acute distress.  Skin is warm and dry.  HEENT is normal.  Neck is supple. No thyromegaly.  Chest is clear to auscultation with normal expansion.  Cardiovascular exam is regular rate and rhythm.  Abdominal exam nontender or distended. No masses palpated. Extremities show no edema. neuro grossly intact    Lab Results: Basic Metabolic Panel:  Recent Labs  10/16/12 1208 10/17/12 0620  NA 138 140  K 4.1 4.2  CL 102 105  CO2 24 26  GLUCOSE 92 95  BUN 9 10  CREATININE 0.73 0.65  CALCIUM 9.6 9.0   CBC:  Recent Labs  10/16/12 1208 10/17/12 0620  WBC 6.8 7.2  HGB 12.7 11.9*  HCT 37.6 35.5*  MCV 93.1 92.7  PLT 433* 401*   Cardiac Enzymes:  Recent Labs  10/16/12 1640 10/16/12 2237 10/17/12 0625  TROPONINI <0.30 <0.30 <0.30     Assessment/Plan:  1. Chest pain / dizziness - Per discussion with Dr. Brackbill, he would like to proceed with cath; Risks and benefits discussed and patient agrees to proceed. Note enzymes negative. 2. CAD s/p RCA stent x 2 in 2012 - continue ASA and statin. Avoid BB given bradycardia.  3. Sinus bradycardia - the degree to which this is contributing to her symptoms is unclear. Check TSH and free T4. Avoid beta blockers in the future.  4. Systolic murmur - check echo.  5. R carotid bruit - check dopplers  (could be done as outpt).  6. Abnormal lung sounds with longstanding tobacco abuse - CXR showing chronic interstitial disease. Strongly advised cessation of tobacco.  7. HLD, only tolerates low dose lipitor - continue 8. HTN - controlled.   Brian Crenshaw 10/17/2012, 8:52 AM    

## 2012-10-17 NOTE — CV Procedure (Signed)
   Cardiac Catheterization Procedure Note  Name: Katherine [name] MRN: [id number] DOB: 1945/10/26  Procedure: Left Heart Cath, Selective Coronary Angiography, LV angiography  Indication: Chest pain with known history of coronary artery disease.  Medications:  Sedation:  1 mg IV Versed, 75 mcg IV Fentanyl  Contrast:  75 mL Omnipaque   Procedural Details: The right wrist was prepped, draped, and anesthetized with 1% lidocaine. Using the modified Seldinger technique, a 5 French sheath was introduced into the right radial artery. 3 mg of verapamil was administered through the sheath, weight-based unfractionated heparin was administered intravenously. A Jackie catheter was used for selective coronary angiography. A pigtail catheter was used for left ventriculography. Catheter exchanges were performed over an exchange length guidewire. There were no immediate procedural complications. A TR band was used for radial hemostasis at the completion of the procedure.  The patient was transferred to the post catheterization recovery area for further monitoring.  Procedural Findings:  Hemodynamics: AO:  147/53   mmHg LV:  147/2    mmHg LVEDP: 13  mmHg  Coronary angiography: Coronary dominance: Right    Left Main:  Normal  Left Anterior Descending (LAD):  Normal in size with minor irregularities. There is a 20% lesion in the midsegment. The cardiac veins seem to be returning blood  into the left ventricle directly. This was noted on previous catheterization as well.  1st diagonal (D1):  Large in size and tortuous proximally with no significant disease.  2nd diagonal (D2):  Normal in size with minor irregularities.  3rd diagonal (D3):  Very small in size.  Circumflex (LCx):  Normal in size and nondominant. There is 20% proximal disease.   1st obtuse marginal:  Normal in size with minor irregularities.  2nd obtuse marginal:  Normal in size with 50% proximal disease.  3rd obtuse marginal:  Medium  in size with minor irregularities.   Right Coronary Artery: Normal in size and dominant. Overlapped stents noted in the proximal and midsegment which are patent with mild 20% in-stent restenosis. No significant disease in the distal vessel.  Posterior descending artery: Large in size with no significant disease.  Posterior AV segment: Normal in size with no significant disease.  Posterolateral branchs:  No significant disease.  Left ventriculography: Left ventricular systolic function is normal , LVEF is estimated at 55-60 %, there is no significant mitral regurgitation   Final Conclusions:   1. Significant 1 vessel coronary artery disease with patent stents in the RCA. No evidence of obstructive disease at this time. 2. Normal LV systolic function and left ventricular end-diastolic pressure.  Recommendations:  Continue medical therapy. Can likely discharge home in the morning if otherwise stable.  [name] MD, [facility] 10/17/2012, 4:02 PM

## 2012-10-18 ENCOUNTER — Encounter (HOSPITAL_COMMUNITY): Payer: Self-pay | Admitting: Nurse Practitioner

## 2012-10-18 DIAGNOSIS — I369 Nonrheumatic tricuspid valve disorder, unspecified: Secondary | ICD-10-CM

## 2012-10-18 DIAGNOSIS — R079 Chest pain, unspecified: Secondary | ICD-10-CM | POA: Diagnosis present

## 2012-10-18 DIAGNOSIS — R42 Dizziness and giddiness: Secondary | ICD-10-CM

## 2012-10-18 DIAGNOSIS — I2510 Atherosclerotic heart disease of native coronary artery without angina pectoris: Secondary | ICD-10-CM

## 2012-10-18 DIAGNOSIS — I498 Other specified cardiac arrhythmias: Secondary | ICD-10-CM

## 2012-10-18 DIAGNOSIS — I447 Left bundle-branch block, unspecified: Secondary | ICD-10-CM

## 2012-10-18 DIAGNOSIS — R0989 Other specified symptoms and signs involving the circulatory and respiratory systems: Secondary | ICD-10-CM

## 2012-10-18 DIAGNOSIS — R001 Bradycardia, unspecified: Secondary | ICD-10-CM | POA: Diagnosis present

## 2012-10-18 NOTE — Progress Notes (Signed)
TELEMETRY: Reviewed telemetry pt in sinus bradycardia. Rate 41-55 bpm. No pauses: Filed Vitals:   10/17/12 1800 10/17/12 1830 10/17/12 1930 10/18/12 0500  BP: 147/65 136/83 129/76 145/65  Pulse: 46 47 56 44  Temp:   98.1 F (36.7 C) 98 F (36.7 C)  TempSrc:      Resp:   18 16  Height:      Weight:      SpO2: 95% 96% 97% 97%    Intake/Output Summary (Last 24 hours) at 10/18/12 0837 Last data filed at 10/18/12 0826  Gross per 24 hour  Intake    240 ml  Output      0 ml  Net    240 ml    SUBJECTIVE Feels fine. No dizzyness or chest pain. Relates all her symptoms to being put on low dose of metoprolol.  LABS: Basic Metabolic Panel:  Recent Labs  40/98/11 1208 10/17/12 0620  NA 138 140  K 4.1 4.2  CL 102 105  CO2 24 26  GLUCOSE 92 95  BUN 9 10  CREATININE 0.73 0.65  CALCIUM 9.6 9.0   Liver Function Tests:  Recent Labs  10/16/12 1640  AST 14  ALT 6  ALKPHOS 78  BILITOT 0.3  PROT 7.5  ALBUMIN 3.2*   No results found for this basename: LIPASE, AMYLASE,  in the last 72 hours CBC:  Recent Labs  10/16/12 1208 10/17/12 0620  WBC 6.8 7.2  HGB 12.7 11.9*  HCT 37.6 35.5*  MCV 93.1 92.7  PLT 433* 401*   Cardiac Enzymes:  Recent Labs  10/16/12 1640 10/16/12 2237 10/17/12 0625  TROPONINI <0.30 <0.30 <0.30   BNP: No components found with this basename: POCBNP,  D-Dimer: No results found for this basename: DDIMER,  in the last 72 hours Hemoglobin A1C: No results found for this basename: HGBA1C,  in the last 72 hours Fasting Lipid Panel:  Recent Labs  10/17/12 0620  CHOL 181  HDL 33*  LDLCALC 126*  TRIG 108  CHOLHDL 5.5   Thyroid Function Tests:  Recent Labs  10/16/12 1640  TSH 1.988   Anemia Panel: No results found for this basename: VITAMINB12, FOLATE, FERRITIN, TIBC, IRON, RETICCTPCT,  in the last 72 hours  Radiology/Studies:  Dg Chest 2 View  10/16/2012   *RADIOLOGY REPORT*  Clinical Data: Chest pain and shortness of breath.   Abnormal breath sounds.  CHEST - 2 VIEW  Comparison: 06/17/2010  Findings: Heart size and pulmonary vascularity are normal. Coronary artery stent noted.  Slight scarring at both lung bases. No acute infiltrates or effusions.  No acute osseous abnormality.  IMPRESSION: Chronic interstitial disease at the lung bases.  No acute abnormalities.   Original Report Authenticated By: Francene Boyers, M.D.    PHYSICAL EXAM General: Well developed, well nourished, in no acute distress. Head: Normocephalic, atraumatic, sclera non-icteric, no xanthomas, nares are without discharge. Neck: ? Soft right carotid bruit. JVD not elevated. Lungs: Clear bilaterally to auscultation without wheezes, rales, or rhonchi. Breathing is unlabored. Heart: RRR S1 S2 without murmurs, rubs, or gallops.  Abdomen: Soft, non-tender, non-distended with normoactive bowel sounds. No hepatomegaly. No rebound/guarding. No obvious abdominal masses. Msk:  Strength and tone appears normal for age. Extremities: No clubbing, cyanosis or edema.  Distal pedal pulses are 2+ and equal bilaterally. Neuro: Alert and oriented X 3. Moves all extremities spontaneously. Psych:  Responds to questions appropriately with a normal affect.  ASSESSMENT AND PLAN: 1. Dizzyness- likely due to metoprolol in  setting of baseline sinus bradycardia. Now asymptomatic despite slow HR. Will need to avoid any rate slowing meds in the future. 2. Chest pain. Cardiac cath shows patent stents 3.HTN 4. LBBB  Plan: Echo and carotid dopplers ordered. Will try and expedite this am. Unless she has critical carotid disease she is stable for discharge today.  Principal Problem:   Chest pain Active Problems:   Coronary artery disease   Hypertension   Left bundle-branch block   PVC's (premature ventricular contractions)   Sinus bradycardia   Dizzy spells    Signed, Peter [place] MD,FACC 10/18/2012 8:37 AM

## 2012-10-18 NOTE — Progress Notes (Signed)
*  PRELIMINARY RESULTS* Vascular Ultrasound Carotid Duplex (Doppler) has been completed.   There is no obvious evidence of hemodynamically significant internal carotid artery stenosis >40%. The right vertebral artery is patent with antegrade flow. Unable to visualize the left vertebral artery.  10/18/2012 9:36 AM Gertie Fey, RVT, RDCS, RDMS

## 2012-10-18 NOTE — Progress Notes (Signed)
Echo Lab  2D Echocardiogram completed.  Melvern Ramone L Asaf Elmquist, RDCS 10/18/2012 9:42 AM

## 2012-10-18 NOTE — Progress Notes (Signed)
D/c orders received;IV removed with gauze on, pt remains in stable condition, pt meds and instructions reviewed and given to pt; pt d/c to home 

## 2012-10-18 NOTE — Discharge Summary (Signed)
Patient ID: Katherine [name],  MRN: [id number], DOB/AGE: 1946/03/28 67 y.o.  Admit date: 10/16/2012 Discharge date: 10/18/2012  Primary Care Provider: Bing Plume Primary Cardiologist: Wylene Simmer, MD   Discharge Diagnoses Principal Problem:   Chest pain  **s/p catheterization this admission revealing patent RCA stents and otherwise nonobstructive CAD ->Med Rx. Active Problems:   Coronary artery disease   Hypertension   Sinus bradycardia  **Beta-blocker d/c'd.   Dizzy spells   Left bundle-branch block   PVC's (premature ventricular contractions)   Carotid Disease  **No hemodynamically significant stenosis by u/s this admission.  Allergies Allergies  Allergen Reactions  . Ezetimibe   . Isosorbide Swelling    SWELLING OF THE TONGUE - pt states she received this after PNA ? But has been able to tolerate SL nitroglycerin without adverse reaction.  . Metoprolol Succinate (Metoprolol)     Dizziness, diarrhea, tingling in fingers  . Plavix (Clopidogrel Bisulfate)     Prior P2Y12 showing 267 (poor Plavix responsiveness)  . Sulfa Antibiotics     Sulfa drugs as a child  . Sulfonamide Derivatives     Sulfa drugs as a child  . Zocor (Simvastatin)     Leg pain - does not tolerate high doses of statins   Procedures  Cardiac Catheterization 7.2.2014  Procedural Findings:  Hemodynamics: AO:  147/53   mmHg LV:  147/2    mmHg LVEDP: 13  mmHg  Coronary angiography: Coronary dominance: Right      Left Main:  Normal  Left Anterior Descending (LAD):  Normal in size with minor irregularities. There is a 20% lesion in the midsegment. The cardiac veins seem to be returning blood  into the left ventricle directly. This was noted on previous catheterization as well.  1st diagonal (D1):  Large in size and tortuous proximally with no significant disease.  2nd diagonal (D2):  Normal in size with minor irregularities.  3rd diagonal (D3):  Very small in size.  Circumflex (LCx):  Normal  in size and nondominant. There is 20% proximal disease.    1st obtuse marginal:  Normal in size with minor irregularities.  2nd obtuse marginal:  Normal in size with 50% proximal disease.  3rd obtuse marginal:  Medium in size with minor irregularities.     Right Coronary Artery: Normal in size and dominant. Overlapped stents noted in the proximal and midsegment which are patent with mild 20% in-stent restenosis. No significant disease in the distal vessel.  Posterior descending artery: Large in size with no significant disease.  Posterior AV segment: Normal in size with no significant disease.  Posterolateral branchs:  No significant disease.  Left ventriculography: Left ventricular systolic function is normal , LVEF is estimated at 55-60 %, there is no significant mitral regurgitation   Final Conclusions:   1. Significant 1 vessel coronary artery disease with patent stents in the RCA. No evidence of obstructive disease at this time. 2. Normal LV systolic function and left ventricular end-diastolic pressure.  Recommendations:  Continue medical therapy.  _____________  Carotid Ultrasound (preliminary result) 7.3.2014  Vascular Ultrasound Carotid Duplex (Doppler) has been completed.    There is no obvious evidence of hemodynamically significant internal carotid artery stenosis >40%. The right vertebral artery is patent with antegrade flow. Unable to visualize the left vertebral artery. _____________  2D Echocardiogram **Result pending _____________   History of Present Illness  Katherine [name] is a 67 y/o M with history of HTN, HL, LBBB, CAD s/p 2 DES to RCA 04/2010 (days  after a normal nuc; also had nonobstructive OM disease) and tobacco abuse who presented to [facility] today with chest pain. She was recently seen in the office 09/25/12 after a long absence. EKG showed NSR with ventricular bigeminy thus low dose Toprol 12.5mg  was added. She did not pick this medicine up until  several days later. After 1 dose, she developed tingling in her hands. Her husband researched side effects online and she became very concerned when reading the side effects. She tried to continue on the medicine for the next several days but also noticed pain in her legs, stomach, and back that lasted several days. She discontinued this medication 2 days ago.  On the morning of admission she awoke with weakness, fatigue, dizziness, and presyncope.  This was followed by chest pressure, which prompted her to present to the [facility] ED for evaluation.  There, chest pain subsided.  She was found to be bradycardic with rates in the 40's.  ECG was otherwise nonacute.  Initial troponin was normal.  She was admitted for further evaluation.   Hospital Course  Patient ruled out for MI.  Toprol therapy was discontinued.  Decision was made to pursue diagnostic catheterization since she had previously had a normal stress test which was later followed by cath and stenting.  She underwent diagnostic catheterization on 7/2, revealing patent RCA stents and otherwise nonobstructive disease.  Heart rates have been stable in the 40's to 60's.  She has had no further chest pain or presyncope.  Carotid u/s showed no hemodynamically significant carotid disease.  2D echo was performed and results are pending at this time, though cath did show normal LV function.  She will be discharged home today in good condition.  Discharge Vitals Blood pressure 146/78, pulse 60, temperature 98 F (36.7 C), temperature source Oral, resp. rate 16, height 5\' 5"  (1.651 m), weight 127 lb 11.2 oz (57.924 kg), SpO2 97.00%.  Filed Weights   10/16/12 1713  Weight: 127 lb 11.2 oz (57.924 kg)    Labs  CBC  Recent Labs  10/16/12 1208 10/17/12 0620  WBC 6.8 7.2  HGB 12.7 11.9*  HCT 37.6 35.5*  MCV 93.1 92.7  PLT 433* 401*   Basic Metabolic Panel  Recent Labs  10/16/12 1208 10/17/12 0620  NA 138 140  K 4.1 4.2  CL 102 105  CO2 24 26   GLUCOSE 92 95  BUN 9 10  CREATININE 0.73 0.65  CALCIUM 9.6 9.0   Liver Function Tests  Recent Labs  10/16/12 1640  AST 14  ALT 6  ALKPHOS 78  BILITOT 0.3  PROT 7.5  ALBUMIN 3.2*   Cardiac Enzymes  Recent Labs  10/16/12 1640 10/16/12 2237 10/17/12 0625  TROPONINI <0.30 <0.30 <0.30   Fasting Lipid Panel  Recent Labs  10/17/12 0620  CHOL 181  HDL 33*  LDLCALC 126*  TRIG 108  CHOLHDL 5.5   Thyroid Function Tests  Recent Labs  10/16/12 1640  TSH 1.988   Disposition  Pt is being discharged home today in good condition.  Follow-up Plans & Appointments  Follow-up Information   Follow up with Norma Fredrickson, NP On 11/09/2012. (11:00 AM)    Contact information:   [address] [place] [address] [phone]      Discharge Medications    Medication List         amLODipine 5 MG tablet  Commonly known as:  NORVASC  Take 5 mg by mouth daily.  aspirin 81 MG tablet  Take 81 mg by mouth daily.     atorvastatin 20 MG tablet  Commonly known as:  LIPITOR  Take 10 mg by mouth daily. Takes 1/2 tab (10mg ) due to muscle ache     nitroGLYCERIN 0.4 MG SL tablet  Commonly known as:  NITROSTAT  Place 1 tablet (0.4 mg total) under the tongue every 5 (five) minutes as needed.     vitamin C 500 MG tablet  Commonly known as:  ASCORBIC ACID  Take 250 mg by mouth daily.       Outstanding Labs/Studies  2D echo performed this AM and result is currently pending.  Duration of Discharge Encounter   Greater than 30 minutes including physician time.  Signed, Nicolasa Ducking NP 10/18/2012, 10:21 AM

## 2012-10-19 NOTE — Discharge Summary (Signed)
Patient seen and examined and history reviewed. Agree with above findings and plan. See earlier rounding note.  Thedora Hinders 10/19/2012 8:52 AM

## 2012-10-22 ENCOUNTER — Telehealth: Payer: Self-pay | Admitting: *Deleted

## 2012-10-22 NOTE — Telephone Encounter (Signed)
None of her present meds should be causing bradycardia. Consider see for OV Thursday with EKG

## 2012-10-22 NOTE — Telephone Encounter (Signed)
Scheduled appointment and advised husband

## 2012-10-22 NOTE — Telephone Encounter (Signed)
Spoke with husband and advised of doppler. He states patient is still very weak. Per husband Dr Kirke Corin told him that patient heart rate down in the 40's and he said that was why she is feeling bad. Advised  Dr. Patty Sermons out of office but would discuss with him. Will forward to  Dr. Patty Sermons for review.

## 2012-10-22 NOTE — Telephone Encounter (Signed)
Message copied by Burnell Blanks on Mon Oct 22, 2012 12:06 PM ------      Message from: Cassell Clement      Created: Thu Oct 18, 2012  9:12 PM       Please report.  Normal carotid doppler. ------

## 2012-10-23 ENCOUNTER — Encounter: Payer: Self-pay | Admitting: Cardiology

## 2012-10-23 ENCOUNTER — Encounter: Payer: Self-pay | Admitting: Cardiovascular Disease

## 2012-10-25 ENCOUNTER — Ambulatory Visit (INDEPENDENT_AMBULATORY_CARE_PROVIDER_SITE_OTHER): Payer: Medicare Other | Admitting: Cardiology

## 2012-10-25 ENCOUNTER — Encounter: Payer: Self-pay | Admitting: Cardiology

## 2012-10-25 VITALS — BP 124/70 | HR 60 | Ht 65.0 in | Wt 128.1 lb

## 2012-10-25 DIAGNOSIS — I493 Ventricular premature depolarization: Secondary | ICD-10-CM

## 2012-10-25 DIAGNOSIS — Z862 Personal history of diseases of the blood and blood-forming organs and certain disorders involving the immune mechanism: Secondary | ICD-10-CM

## 2012-10-25 DIAGNOSIS — I10 Essential (primary) hypertension: Secondary | ICD-10-CM

## 2012-10-25 DIAGNOSIS — Z8639 Personal history of other endocrine, nutritional and metabolic disease: Secondary | ICD-10-CM

## 2012-10-25 DIAGNOSIS — I259 Chronic ischemic heart disease, unspecified: Secondary | ICD-10-CM

## 2012-10-25 DIAGNOSIS — E7800 Pure hypercholesterolemia, unspecified: Secondary | ICD-10-CM

## 2012-10-25 DIAGNOSIS — I4949 Other premature depolarization: Secondary | ICD-10-CM

## 2012-10-25 NOTE — Assessment & Plan Note (Signed)
The patient had recent PVCs and had been given a trial of beta blocker.  She did not tolerate the beta blocker at all.  We are going to avoid beta blockers in the future.  She is no longer having PVCs

## 2012-10-25 NOTE — Assessment & Plan Note (Addendum)
Patient has a history of hypercholesterolemia and is on Lipitor 20 mg daily.  No adverse reactions so far

## 2012-10-25 NOTE — Progress Notes (Signed)
Katherine [name] Date of Birth:  1945/07/18 [facility] [address] [place], [place]  [address] [phone]         Fax   [phone]  History of Present Illness: This pleasant 67 year old woman is seen for a post hospital office visit.  She was recently admitted with chest pain.  She had cardiac catheterization on 10/17/12 showing significant 1 vessel coronary disease with patent stents in the RCA.  No evidence of obstructive disease.  Left ventricular function was normal. She has a history of known left bundle branch block. She has had known ischemic heart disease. She had her initial PCI on 05/14/10 at which time she was found to have a high-grade lesion in the midportion of the right coronary artery. She had a normal nuclear stress test 3 days prior to her successful PCI. She has 2 overlapping drug-eluting stents and was on Effient for a year and now is on 81 mg aspirin alone.  The patient had started smoking again prior to her recent hospital stay.  She is now off cigarettes entirely once again She has a remote history of pneumonia for which she was hospitalized.   Current Outpatient Prescriptions  Medication Sig Dispense Refill  . amLODipine (NORVASC) 5 MG tablet Take 5 mg by mouth daily.      . Ascorbic Acid (VITAMIN C) 500 MG tablet Take 250 mg by mouth daily.       [name] aspirin 81 MG tablet Take 81 mg by mouth daily.       [name] atorvastatin (LIPITOR) 20 MG tablet Take 10 mg by mouth daily. Takes 1/2 tab (10mg ) due to muscle ache      . nitroGLYCERIN (NITROSTAT) 0.4 MG SL tablet Place 1 tablet (0.4 mg total) under the tongue every 5 (five) minutes as needed.  25 tablet  prn   No current facility-administered medications for this visit.    Allergies  Allergen Reactions  . Ezetimibe   . Isosorbide Swelling    SWELLING OF THE TONGUE - pt states she received this after PNA ? But has been able to tolerate SL nitroglycerin without adverse reaction.  . Metoprolol Succinate  (Metoprolol)     Dizziness, diarrhea, tingling in fingers  . Plavix (Clopidogrel Bisulfate)     Prior P2Y12 showing 267 (poor Plavix responsiveness)  . Sulfa Antibiotics     Sulfa drugs as a child  . Sulfonamide Derivatives     Sulfa drugs as a child  . Zocor (Simvastatin)     Leg pain - does not tolerate high doses of statins    Patient Active Problem List   Diagnosis Date Noted  . Anginal pain     Priority: Medium  . Nosebleed     Priority: Medium  . History Of Hypercholesterolemia     Priority: Medium  . Tobacco abuse     Priority: Medium  . Chest pain 10/18/2012  . Sinus bradycardia 10/18/2012  . Dizzy spells 10/18/2012  . PVC's (premature ventricular contractions) 09/25/2012  . Coronary artery disease   . Hypertension   . Left bundle-branch block   . Ischemic heart disease     History  Smoking status  . Former Smoker  Smokeless tobacco  . Not on file    Comment: Smoking for over 45 years    History  Alcohol Use No    Family History  Problem Relation Age of Onset  . Cancer Father   . Heart disease Father  Review of Systems: Constitutional: no fever chills diaphoresis or fatigue or change in weight.  Head and neck: no hearing loss, no epistaxis, no photophobia or visual disturbance. Respiratory: No cough, shortness of breath or wheezing. Cardiovascular: No chest pain peripheral edema, palpitations. Gastrointestinal: No abdominal distention, no abdominal pain, no change in bowel habits hematochezia or melena. Genitourinary: No dysuria, no frequency, no urgency, no nocturia. Musculoskeletal:No arthralgias, no back pain, no gait disturbance or myalgias. Neurological: No dizziness, no headaches, no numbness, no seizures, no syncope, no weakness, no tremors. Hematologic: No lymphadenopathy, no easy bruising. Psychiatric: No confusion, no hallucinations, no sleep disturbance.    Physical Exam: Filed Vitals:   10/25/12 1151  BP: 124/70  Pulse: 60    the general appearance reveals a well-developed well-nourished woman in no distress.The head and neck exam reveals pupils equal and reactive.  Extraocular movements are full.  There is no scleral icterus.  The mouth and pharynx are normal.  The neck is supple.  The carotids reveal no bruits.  The jugular venous pressure is normal.  The  thyroid is not enlarged.  There is no lymphadenopathy.  The chest is clear to percussion and auscultation.  There are no rales or rhonchi.  Expansion of the chest is symmetrical.  The precordium is quiet.  The first heart sound is normal.  The second heart sound is physiologically split.  There is no murmur gallop rub or click.  There is no abnormal lift or heave.  The abdomen is soft and nontender.  The bowel sounds are normal.  The liver and spleen are not enlarged.  There are no abdominal masses.  There are no abdominal bruits.  Extremities reveal good pedal pulses.  There is no phlebitis or edema.  There is no cyanosis or clubbing.  Strength is normal and symmetrical in all extremities.  There is no lateralizing weakness.  There are no sensory deficits.  The skin is warm and dry.  There is no rash.     Assessment / Plan: Continue same medication.  Recheck in 4 months for office visit EKG and fasting lipid panel hepatic function panel and basal metabolic panel. We commended her on her having stopped smoking.

## 2012-10-25 NOTE — Assessment & Plan Note (Signed)
Since discharge from the hospital the patient has had no recurrent chest pain or angina and she feels well.  She is very happy that she is no longer smoking.  She is trying to get her husband to quit smoking.

## 2012-10-25 NOTE — Patient Instructions (Signed)
Your physician recommends that you continue on your current medications as directed. Please refer to the Current Medication list given to you today.  Your physician wants you to follow-up in: 4 months with fasting labs (lp/bmet/hfp) and ekg You will receive a reminder letter in the mail two months in advance. If you don't receive a letter, please call our office to schedule the follow-up appointment.  

## 2012-10-25 NOTE — Assessment & Plan Note (Signed)
Blood pressure is well-controlled on amlodipine 5 mg daily

## 2012-11-09 ENCOUNTER — Encounter: Payer: Self-pay | Admitting: Nurse Practitioner

## 2012-11-09 ENCOUNTER — Encounter (INDEPENDENT_AMBULATORY_CARE_PROVIDER_SITE_OTHER): Payer: Medicare Other | Admitting: Nurse Practitioner

## 2012-11-09 VITALS — BP 158/78 | HR 50 | Ht 64.0 in | Wt 132.4 lb

## 2012-11-09 DIAGNOSIS — R0989 Other specified symptoms and signs involving the circulatory and respiratory systems: Secondary | ICD-10-CM

## 2012-11-09 DIAGNOSIS — Z9889 Other specified postprocedural states: Secondary | ICD-10-CM

## 2012-11-09 DIAGNOSIS — I2510 Atherosclerotic heart disease of native coronary artery without angina pectoris: Secondary | ICD-10-CM

## 2012-11-09 NOTE — Progress Notes (Deleted)
Katherine [name] Date of Birth: 1946-02-05 Medical Record #[id number]  History of Present Illness: Katherine [name] is seen back today for a post hospital visit. Seen for Dr. Patty [name]. Has known CAD, HTN, sinus bradycardia (no longer on beta blocker), LBBB, PVCs and carotid disease with recent doppler not showing any hemodynamically significant stenosis.  She has had tobacco abuse and just recently stopped.   Most recently admitted with chest pain - had cardiac cath - this showed patent stents in the RCA and otherwise nonobstructive CAD - managed medically. Has already seen Dr. Patty [name] back earlier this month - seemed to be doing ok.   Comes back today. Here with  Current Outpatient Prescriptions  Medication Sig Dispense Refill  . amLODipine (NORVASC) 5 MG tablet Take 5 mg by mouth daily.      . Ascorbic Acid (VITAMIN C) 500 MG tablet Take 250 mg by mouth daily.       [name] aspirin 81 MG tablet Take 81 mg by mouth daily.       [name] atorvastatin (LIPITOR) 20 MG tablet Take 10 mg by mouth daily. Takes 1/2 tab (10mg ) due to muscle ache      . nitroGLYCERIN (NITROSTAT) 0.4 MG SL tablet Place 1 tablet (0.4 mg total) under the tongue every 5 (five) minutes as needed.  25 tablet  prn   No current facility-administered medications for this visit.    Allergies  Allergen Reactions  . Ezetimibe   . Isosorbide Swelling    SWELLING OF THE TONGUE - pt states she received this after PNA ? But has been able to tolerate SL nitroglycerin without adverse reaction.  . Metoprolol Succinate (Metoprolol)     Dizziness, diarrhea, tingling in fingers  . Plavix (Clopidogrel Bisulfate)     Prior P2Y12 showing 267 (poor Plavix responsiveness)  . Sulfa Antibiotics     Sulfa drugs as a child  . Sulfonamide Derivatives     Sulfa drugs as a child  . Zocor (Simvastatin)     Leg pain - does not tolerate high doses of statins    Past Medical History  Diagnosis Date  . Coronary artery disease     a. s/p overlapping DES to RCA  04/2010 (days after neg stress test). b. Cath 06/2010 - patent stents, nonobst dz otherwise;  c. 10/2012 Cath: LM nl, LAD 100m, D1/2/3 nl, LCX 20p, OM2 50p, RCA 20% ISR mid, EF 55-60% ->Med Rx.  [name] Nosebleed   . Hypertension   . History of hypercholesterolemia     Statin intolerant  . Left bundle-branch block   . Tobacco abuse   . Sinus bradycardia     a. In past prevented BB use.  . Monoclonal gammopathy of undetermined significance     a. Previously seen by heme - this is per chart but pt does not recall.  [name] PVC's (premature ventricular contractions)   . Carotid artery disease     a. Hx r carotid bruit - dopplers 2009 <50% bilat;  b. 10/2012 U/S: no hemodynamically signif stenosis.    Past Surgical History  Procedure Laterality Date  . Pci      HIGH GRADE LESION IN THE MIDPORTION OF THE RIGHT CORONARY ARTERY WHICH WAS A LARGE ARTERY  . Cardiac catheterization  05/14/10    SHOWED EVIDENCE OF REVERSIBLE VASOSPASM    History  Smoking status  . Former Smoker  Smokeless tobacco  . Not on file    Comment: Smoking for over 45 years  History  Alcohol Use No    Family History  Problem Relation Age of Onset  . Cancer Father   . Heart disease Father     Review of Systems: The review of systems is per the HPI.  All other systems were reviewed and are negative.  Physical Exam: There were no vitals taken for this visit. Patient is very pleasant and in no acute distress. Skin is warm and dry. Color is normal.  HEENT is unremarkable. Normocephalic/atraumatic. PERRL. Sclera are nonicteric. Neck is supple. No masses. No JVD. Lungs are clear. Cardiac exam shows a regular rate and rhythm. Abdomen is soft. Extremities are without edema. Gait and ROM are intact. No gross neurologic deficits noted.  LABORATORY DATA:  Lab Results  Component Value Date   WBC 7.2 10/17/2012   HGB 11.9* 10/17/2012   HCT 35.5* 10/17/2012   PLT 401* 10/17/2012   GLUCOSE 95 10/17/2012   CHOL 181 10/17/2012   TRIG 108  10/17/2012   HDL 33* 10/17/2012   LDLCALC 126* 10/17/2012   ALT 6 10/16/2012   AST 14 10/16/2012   NA 140 10/17/2012   K 4.2 10/17/2012   CL 105 10/17/2012   CREATININE 0.65 10/17/2012   BUN 10 10/17/2012   CO2 26 10/17/2012   TSH 1.988 10/16/2012   INR 1.04 10/17/2012   Coronary angiography:  Coronary dominance: Right  Left Main: Normal  Left Anterior Descending (LAD): Normal in size with minor irregularities. There is a 20% lesion in the midsegment. The cardiac veins seem to be returning blood into the left ventricle directly. This was noted on previous catheterization as well.  1st diagonal (D1): Large in size and tortuous proximally with no significant disease.  2nd diagonal (D2): Normal in size with minor irregularities.  3rd diagonal (D3): Very small in size.  Circumflex (LCx): Normal in size and nondominant. There is 20% proximal disease.  1st obtuse marginal: Normal in size with minor irregularities.  2nd obtuse marginal: Normal in size with 50% proximal disease.  3rd obtuse marginal: Medium in size with minor irregularities.  Right Coronary Artery: Normal in size and dominant. Overlapped stents noted in the proximal and midsegment which are patent with mild 20% in-stent restenosis. No significant disease in the distal vessel.  Posterior descending artery: Large in size with no significant disease.  Posterior AV segment: Normal in size with no significant disease.  Posterolateral branchs: No significant disease.  Left ventriculography: Left ventricular systolic function is normal , LVEF is estimated at 55-60 %, there is no significant mitral regurgitation   Final Conclusions:  1. Significant 1 vessel coronary artery disease with patent stents in the RCA. No evidence of obstructive disease at this time.  2. Normal LV systolic function and left ventricular end-diastolic pressure.   Katherine Bears MD, [facility]  10/17/2012, 4:02 PM    Assessment / Plan: 1. CAD - s/p recent cath - to manage medically -  her stents in the RCA were patent and there was nonobstructive disease otherwise -   2. HTN -   3. HLD -   4. Sinus bradycardia - no longer on beta blocker -   Patient is agreeable to this plan and will call if any problems develop in the interim.   Rosalio Macadamia, RN, ANP-C [facility] [address] [place], [place]  [address]

## 2012-11-09 NOTE — Patient Instructions (Addendum)
Stay on your current medicines  See Dr. Patty Sermons as planned in September  Call the [facility] office at [phone] if you have any questions, problems or concerns.

## 2012-11-09 NOTE — Progress Notes (Signed)
  This encounter was created in error - please disregard.  Patient has already been seen for her post hospital, currently having no cardiac issues and thought she was here for a primary care visit.   We will see her back in September as planned.

## 2012-11-16 ENCOUNTER — Other Ambulatory Visit: Payer: Self-pay

## 2012-11-16 DIAGNOSIS — Z1231 Encounter for screening mammogram for malignant neoplasm of breast: Secondary | ICD-10-CM

## 2012-11-21 ENCOUNTER — Other Ambulatory Visit: Payer: Self-pay

## 2012-12-05 ENCOUNTER — Ambulatory Visit: Payer: Medicare Other

## 2012-12-26 ENCOUNTER — Ambulatory Visit (INDEPENDENT_AMBULATORY_CARE_PROVIDER_SITE_OTHER): Payer: Medicare Other | Admitting: Cardiology

## 2012-12-26 ENCOUNTER — Encounter: Payer: Self-pay | Admitting: Cardiology

## 2012-12-26 VITALS — BP 182/82 | HR 52 | Ht 65.0 in | Wt 132.0 lb

## 2012-12-26 DIAGNOSIS — Z8639 Personal history of other endocrine, nutritional and metabolic disease: Secondary | ICD-10-CM

## 2012-12-26 DIAGNOSIS — I259 Chronic ischemic heart disease, unspecified: Secondary | ICD-10-CM

## 2012-12-26 DIAGNOSIS — I447 Left bundle-branch block, unspecified: Secondary | ICD-10-CM

## 2012-12-26 DIAGNOSIS — I119 Hypertensive heart disease without heart failure: Secondary | ICD-10-CM

## 2012-12-26 DIAGNOSIS — Z862 Personal history of diseases of the blood and blood-forming organs and certain disorders involving the immune mechanism: Secondary | ICD-10-CM

## 2012-12-26 DIAGNOSIS — E7800 Pure hypercholesterolemia, unspecified: Secondary | ICD-10-CM

## 2012-12-26 MED ORDER — AMLODIPINE BESYLATE 10 MG PO TABS: 10.0000 mg | ORAL_TABLET | Freq: Every day | ORAL | Status: DC

## 2012-12-26 NOTE — Assessment & Plan Note (Signed)
Her blood pressure today is elevated at 160/80 rechecked by me.  She does not do well with beta blockers because of her pre-existing sinus bradycardia.  We will increase her amlodipine up to 10 mg daily.  She is to avoid dietary salt.

## 2012-12-26 NOTE — Assessment & Plan Note (Signed)
Patient has a history of hypercholesterolemia.  Her last LDL level was 126.  She has not tolerated statins or ezetimibe.  She will continue to work harder on her diet.

## 2012-12-26 NOTE — Patient Instructions (Signed)
INCREASE YOUR AMLODIPINE TO 10 MG DAILY   AVOID SALT  Your physician recommends that you schedule a follow-up appointment in: 3 months with fasting labs (LP/BMET/HFP)

## 2012-12-26 NOTE — Assessment & Plan Note (Signed)
Since last visit the patient has had no recurrent chest pain or angina.

## 2012-12-26 NOTE — Progress Notes (Signed)
Katherine [name] Date of Birth:  01-Apr-1946 [facility] [address] [place], [place]  [address] [phone]         Fax   [phone]  History of Present Illness: This pleasant 67 year old woman is seen for a scheduled followup office visit. She was admitted on 10/16/12 to [facility] with chest pain. She had cardiac catheterization on 10/17/12 showing significant 1 vessel coronary disease with patent stents in the RCA. No evidence of obstructive disease. Left ventricular function was normal. She has a history of known left bundle branch block. She has had known ischemic heart disease. She had her initial PCI on 05/14/10 at which time she was found to have a high-grade lesion in the midportion of the right coronary artery. She had a normal nuclear stress test 3 days prior to her successful PCI. She has 2 overlapping drug-eluting stents and was on Effient for a year and now is on 81 mg aspirin alone. The patient had started smoking again prior to her recent hospital stay. She is now off cigarettes entirely once again She has a remote history of pneumonia for which she was hospitalized. During her recent hospitalization the patient had an echocardiogram on 10/18/12 which showed normal LV systolic function, aortic valve sclerosis without stenosis, and moderate TR.  The patient also underwent carotid Dopplers on 10/18/12 which showed no obstruction.   Current Outpatient Prescriptions  Medication Sig Dispense Refill  . amLODipine (NORVASC) 10 MG tablet Take 1 tablet (10 mg total) by mouth daily.  30 tablet  5  . Ascorbic Acid (VITAMIN C) 500 MG tablet Take 250 mg by mouth daily.       [name] aspirin 81 MG tablet Take 81 mg by mouth daily.       . nitroGLYCERIN (NITROSTAT) 0.4 MG SL tablet Place 1 tablet (0.4 mg total) under the tongue every 5 (five) minutes as needed.  25 tablet  prn   No current facility-administered medications for this visit.    Allergies  Allergen Reactions    . Ezetimibe   . Isosorbide Swelling    SWELLING OF THE TONGUE - pt states she received this after PNA ? But has been able to tolerate SL nitroglycerin without adverse reaction.  . Metoprolol Succinate [Metoprolol]     Dizziness, diarrhea, tingling in fingers  . Plavix [Clopidogrel Bisulfate]     Prior P2Y12 showing 267 (poor Plavix responsiveness)  . Sulfa Antibiotics     Sulfa drugs as a child  . Sulfonamide Derivatives     Sulfa drugs as a child  . Zocor [Simvastatin]     Leg pain - does not tolerate high doses of statins    Patient Active Problem List   Diagnosis Date Noted  . Anginal pain     Priority: Medium  . Nosebleed     Priority: Medium  . History of hypercholesterolemia     Priority: Medium  . Tobacco abuse     Priority: Medium  . Chest pain 10/18/2012  . Sinus bradycardia 10/18/2012  . Dizzy spells 10/18/2012  . PVC's (premature ventricular contractions) 09/25/2012  . Coronary artery disease   . Hypertension   . Left bundle-branch block   . Ischemic heart disease     History  Smoking status  . Former Smoker  Smokeless tobacco  . Not on file    Comment: Smoking for over 45 years    History  Alcohol Use No    Family History  Problem  Relation Age of Onset  . Cancer Father   . Heart disease Father     Review of Systems: Constitutional: no fever chills diaphoresis or fatigue or change in weight.  Head and neck: no hearing loss, no epistaxis, no photophobia or visual disturbance. Respiratory: No cough, shortness of breath or wheezing. Cardiovascular: No chest pain peripheral edema, palpitations. Gastrointestinal: No abdominal distention, no abdominal pain, no change in bowel habits hematochezia or melena. Genitourinary: No dysuria, no frequency, no urgency, no nocturia. Musculoskeletal:No arthralgias, no back pain, no gait disturbance or myalgias. Neurological: No dizziness, no headaches, no numbness, no seizures, no syncope, no weakness, no  tremors. Hematologic: No lymphadenopathy, no easy bruising. Psychiatric: No confusion, no hallucinations, no sleep disturbance.    Physical Exam: Filed Vitals:   12/26/12 1150  BP: 182/82  Pulse: 52   the general appearance reveals a well-developed well-nourished woman in no distress.  Repeat blood pressure by me in the right arm sitting his 160/80.The head and neck exam reveals pupils equal and reactive.  Extraocular movements are full.  There is no scleral icterus.  The mouth and pharynx are normal.  The neck is supple.  The carotids reveal no bruits.  The jugular venous pressure is normal.  The  thyroid is not enlarged.  There is no lymphadenopathy.  The chest is clear to percussion and auscultation.  There are no rales or rhonchi.  Expansion of the chest is symmetrical.  The precordium is quiet.  The first heart sound is normal.  The second heart sound is paradoxically split.  There is soft systolic ejection murmur at the aortic area.  There is no abnormal lift or heave.  The abdomen is soft and nontender.  The bowel sounds are normal.  The liver and spleen are not enlarged.  There are no abdominal masses.  There are no abdominal bruits.  Extremities reveal good pedal pulses.  There is no phlebitis or edema.  There is no cyanosis or clubbing.  Strength is normal and symmetrical in all extremities.  There is no lateralizing weakness.  There are no sensory deficits.  The skin is warm and dry.  There is no rash.   EKG shows sinus bradycardia at 52 per minute and she has the left bundle-branch block pattern unchanged from 10/16/12  Assessment / Plan: Continue same medication except increase amlodipine to 10 mg daily and avoid dietary salt.  Recheck in 3 months for office visit lipid panel hepatic function panel and basal metabolic panel.  She has not had a cigarette since discharge from the hospital and I have applauded her for that.

## 2013-01-03 ENCOUNTER — Ambulatory Visit
Admission: RE | Admit: 2013-01-03 | Discharge: 2013-01-03 | Disposition: A | Discharge status: Home / Self Care | Payer: Medicare Other | Source: Ambulatory Visit

## 2013-01-03 DIAGNOSIS — Z1231 Encounter for screening mammogram for malignant neoplasm of breast: Secondary | ICD-10-CM

## 2013-01-11 ENCOUNTER — Other Ambulatory Visit: Payer: Self-pay | Admitting: Gastroenterology

## 2013-02-18 ENCOUNTER — Encounter: Payer: Self-pay | Admitting: Cardiology

## 2013-02-21 ENCOUNTER — Other Ambulatory Visit: Payer: Self-pay

## 2013-03-21 ENCOUNTER — Telehealth: Payer: Self-pay | Admitting: Cardiology

## 2013-03-21 NOTE — Telephone Encounter (Signed)
Patient states that she is wanting to come in for a Pneumonia Vaccine "if Dr. Patty Sermons thinks it is a good idea since I've had pneumonia before". Patient states she does not have a PCP other than Dr. Patty Sermons, so if he says yes to the pneumonia vaccine, she will need an appointment. Routed to Dr. Patty Sermons for advisement.  Dr. Patty Sermons responded: "ok for patient to get Pneumonia Vaccine".  Called patient back to inform her of [name] response. Patient will go to [facility] for the vaccine. She verbalized understanding and appreciation.

## 2013-03-21 NOTE — Telephone Encounter (Signed)
New problem    Can she get a pneumonia shot at [facility]. Was advise to contact the office

## 2013-05-22 ENCOUNTER — Telehealth: Payer: Self-pay | Admitting: Cardiology

## 2013-05-22 NOTE — Telephone Encounter (Signed)
New Message  Pt called requests a call back// No further details.

## 2013-05-22 NOTE — Telephone Encounter (Signed)
Left message to call back  

## 2013-05-23 ENCOUNTER — Other Ambulatory Visit: Payer: Self-pay | Admitting: *Deleted

## 2013-05-23 DIAGNOSIS — E785 Hyperlipidemia, unspecified: Secondary | ICD-10-CM

## 2013-05-23 DIAGNOSIS — I2510 Atherosclerotic heart disease of native coronary artery without angina pectoris: Secondary | ICD-10-CM

## 2013-05-23 NOTE — Telephone Encounter (Signed)
Patient wanted to know when she was supposed to come back to see Dr Mare Ferrari.   Per last office note, pt was to f/u in 3 months and have fasting labs.  She is currently feeling pretty well.  Appointment made with Dr. Mare Ferrari for 2/25 and fasting labs scheduled for 2/24.  Pt verbalizes understanding.

## 2013-05-23 NOTE — Telephone Encounter (Signed)
Follow up ° ° ° ° °Returning Melinda's call °

## 2013-06-11 ENCOUNTER — Other Ambulatory Visit: Payer: Medicare Other

## 2013-06-12 ENCOUNTER — Ambulatory Visit (INDEPENDENT_AMBULATORY_CARE_PROVIDER_SITE_OTHER): Payer: 59 | Admitting: Cardiology

## 2013-06-12 ENCOUNTER — Other Ambulatory Visit: Payer: PRIVATE HEALTH INSURANCE

## 2013-06-12 ENCOUNTER — Encounter: Payer: Self-pay | Admitting: Cardiology

## 2013-06-12 ENCOUNTER — Other Ambulatory Visit: Payer: 59

## 2013-06-12 VITALS — BP 170/72 | HR 70 | Ht 65.0 in | Wt 137.0 lb

## 2013-06-12 DIAGNOSIS — I2510 Atherosclerotic heart disease of native coronary artery without angina pectoris: Secondary | ICD-10-CM

## 2013-06-12 DIAGNOSIS — I119 Hypertensive heart disease without heart failure: Secondary | ICD-10-CM

## 2013-06-12 DIAGNOSIS — I259 Chronic ischemic heart disease, unspecified: Secondary | ICD-10-CM

## 2013-06-12 DIAGNOSIS — E7800 Pure hypercholesterolemia, unspecified: Secondary | ICD-10-CM

## 2013-06-12 DIAGNOSIS — I493 Ventricular premature depolarization: Secondary | ICD-10-CM

## 2013-06-12 DIAGNOSIS — E785 Hyperlipidemia, unspecified: Secondary | ICD-10-CM

## 2013-06-12 DIAGNOSIS — I447 Left bundle-branch block, unspecified: Secondary | ICD-10-CM

## 2013-06-12 DIAGNOSIS — Z862 Personal history of diseases of the blood and blood-forming organs and certain disorders involving the immune mechanism: Secondary | ICD-10-CM

## 2013-06-12 DIAGNOSIS — I4949 Other premature depolarization: Secondary | ICD-10-CM

## 2013-06-12 DIAGNOSIS — Z8639 Personal history of other endocrine, nutritional and metabolic disease: Secondary | ICD-10-CM

## 2013-06-12 DIAGNOSIS — Z720 Tobacco use: Secondary | ICD-10-CM

## 2013-06-12 DIAGNOSIS — F17200 Nicotine dependence, unspecified, uncomplicated: Secondary | ICD-10-CM

## 2013-06-12 LAB — HEPATIC FUNCTION PANEL
ALBUMIN: 4 g/dL (ref 3.5–5.2)
ALK PHOS: 62 U/L (ref 39–117)
ALT: 13 U/L (ref 0–35)
AST: 21 U/L (ref 0–37)
BILIRUBIN TOTAL: 0.6 mg/dL (ref 0.3–1.2)
Bilirubin, Direct: 0 mg/dL (ref 0.0–0.3)
Total Protein: 8 g/dL (ref 6.0–8.3)

## 2013-06-12 LAB — LIPID PANEL
CHOL/HDL RATIO: 4
Cholesterol: 231 mg/dL — ABNORMAL HIGH (ref 0–200)
HDL: 60.5 mg/dL (ref >39.00)
TRIGLYCERIDES: 109 mg/dL (ref 0.0–149.0)
VLDL: 21.8 mg/dL (ref 0.0–40.0)

## 2013-06-12 LAB — BASIC METABOLIC PANEL
BUN: 10 mg/dL (ref 6–23)
CHLORIDE: 103 meq/L (ref 96–112)
CO2: 28 meq/L (ref 19–32)
CREATININE: 0.6 mg/dL (ref 0.4–1.2)
Calcium: 9.5 mg/dL (ref 8.4–10.5)
GFR: 104.17 mL/min (ref >60.00)
Glucose, Bld: 83 mg/dL (ref 70–99)
Potassium: 3.8 mEq/L (ref 3.5–5.1)
SODIUM: 138 meq/L (ref 135–145)

## 2013-06-12 LAB — LDL CHOLESTEROL, DIRECT: LDL DIRECT: 156.1 mg/dL

## 2013-06-12 MED ORDER — HYDROCHLOROTHIAZIDE 12.5 MG PO CAPS: 12.5000 mg | ORAL_CAPSULE | Freq: Every day | ORAL | Status: DC

## 2013-06-12 NOTE — Patient Instructions (Signed)
Will obtain labs today and call you with the results (lp/bmet/hfp)  START HYDROCHLOROTHIAZIDE 12.5 MG DAILY   Your physician wants you to follow-up in: 6 WEEK OV/BMET

## 2013-06-12 NOTE — Assessment & Plan Note (Signed)
The patient has not been aware of any recent PVCs or palpitations.

## 2013-06-12 NOTE — Progress Notes (Signed)
Katherine [name] Date of Birth:  July 17, 1945 [address] [place], [place]  [address] [phone]         Fax   [phone]  History of Present Illness: This pleasant 68 year old woman is seen for a scheduled followup office visit. She was admitted on 10/16/12 to [facility] with chest pain. She had cardiac catheterization on 10/17/12 showing significant 1 vessel coronary disease with patent stents in the RCA. No evidence of obstructive disease. Left ventricular function was normal. She has a history of known left bundle branch block. She has had known ischemic heart disease. She had her initial PCI on 05/14/10 at which time she was found to have a high-grade lesion in the midportion of the right coronary artery. She had a normal nuclear stress test 3 days prior to her successful PCI. She has 2 overlapping drug-eluting stents and was on Effient for a year and now is on 81 mg aspirin alone. The patient had started smoking again prior to her recent hospital stay. She is now off cigarettes entirely once again She has a remote history of pneumonia for which she was hospitalized. The patient had an echocardiogram on 10/18/12 which showed normal LV systolic function, aortic valve sclerosis without stenosis, and moderate TR.  The patient also underwent carotid Dopplers on 10/18/12 which showed no obstruction. Since we last saw her she saw an allergy specialist because of intermittent skin rash on her face and was prescribed Zyrtec. She had skin patch testing and was told that she was allergic to dogs cats and mold.   Current Outpatient Prescriptions  Medication Sig Dispense Refill  . amLODipine (NORVASC) 10 MG tablet Take 1 tablet (10 mg total) by mouth daily.  30 tablet  5  . Ascorbic Acid (VITAMIN C) 500 MG tablet Take 250 mg by mouth daily.       [name] aspirin 81 MG tablet Take 81 mg by mouth daily.       . nitroGLYCERIN (NITROSTAT) 0.4 MG SL tablet Place 1 tablet (0.4 mg total) under the  tongue every 5 (five) minutes as needed.  25 tablet  prn  . hydrochlorothiazide (MICROZIDE) 12.5 MG capsule Take 1 capsule (12.5 mg total) by mouth daily.  90 capsule  3   No current facility-administered medications for this visit.    Allergies  Allergen Reactions  . Ezetimibe   . Isosorbide Swelling    SWELLING OF THE TONGUE - pt states she received this after PNA ? But has been able to tolerate SL nitroglycerin without adverse reaction.  . Metoprolol Succinate [Metoprolol]     Dizziness, diarrhea, tingling in fingers  . Plavix [Clopidogrel Bisulfate]     Prior P2Y12 showing 267 (poor Plavix responsiveness)  . Sulfa Antibiotics     Sulfa drugs as a child  . Sulfonamide Derivatives     Sulfa drugs as a child  . Zocor [Simvastatin]     Leg pain - does not tolerate high doses of statins    Patient Active Problem List   Diagnosis Date Noted  . Anginal pain     Priority: Medium  . Nosebleed     Priority: Medium  . History of hypercholesterolemia     Priority: Medium  . Tobacco abuse     Priority: Medium  . Chest pain 10/18/2012  . Sinus bradycardia 10/18/2012  . Dizzy spells 10/18/2012  . PVC's (premature ventricular contractions) 09/25/2012  . Coronary artery disease   . Benign hypertensive heart  disease without heart failure   . Left bundle-branch block   . Ischemic heart disease     History  Smoking status  . Former Smoker  Smokeless tobacco  . Not on file    Comment: Smoking for over 45 years    History  Alcohol Use No    Family History  Problem Relation Age of Onset  . Cancer Father   . Heart disease Father     Review of Systems: Constitutional: no fever chills diaphoresis or fatigue or change in weight.  Head and neck: no hearing loss, no epistaxis, no photophobia or visual disturbance. Respiratory: No cough, shortness of breath or wheezing. Cardiovascular: No chest pain peripheral edema, palpitations. Gastrointestinal: No abdominal distention, no  abdominal pain, no change in bowel habits hematochezia or melena. Genitourinary: No dysuria, no frequency, no urgency, no nocturia. Musculoskeletal:No arthralgias, no back pain, no gait disturbance or myalgias. Neurological: No dizziness, no headaches, no numbness, no seizures, no syncope, no weakness, no tremors. Hematologic: No lymphadenopathy, no easy bruising. Psychiatric: No confusion, no hallucinations, no sleep disturbance.    Physical Exam: Filed Vitals:   06/12/13 1051  BP: 170/72  Pulse: 70   the general appearance reveals a well-developed well-nourished woman in no distress.  Repeat blood pressure by me in the right arm sitting his 160/80.The head and neck exam reveals pupils equal and reactive.  Extraocular movements are full.  There is no scleral icterus.  The mouth and pharynx are normal.  The neck is supple.  The carotids reveal no bruits.  The jugular venous pressure is normal.  The  thyroid is not enlarged.  There is no lymphadenopathy.  The chest is clear to percussion and auscultation.  There are no rales or rhonchi.  Expansion of the chest is symmetrical.  The precordium is quiet.  The first heart sound is normal.  The second heart sound is paradoxically split.  There is soft systolic ejection murmur at the aortic area.  There is no abnormal lift or heave.  The abdomen is soft and nontender.  The bowel sounds are normal.  The liver and spleen are not enlarged.  There are no abdominal masses.  There are no abdominal bruits.  Extremities reveal good pedal pulses.  There is no phlebitis or edema.  There is no cyanosis or clubbing.  Strength is normal and symmetrical in all extremities.  There is no lateralizing weakness.  There are no sensory deficits.  The skin is warm and dry.  There is no rash.     Assessment / Plan: Continue same medication.  Add hydrochlorothiazide 12.5 mg daily.  Lab work today pending.  Recheck in 6 weeks for office visit and basal metabolic panel to  followup on response of blood pressure and followup on electrolytes

## 2013-06-12 NOTE — Assessment & Plan Note (Signed)
Blood pressure is elevated on arrival today.  I rechecked her blood pressure myself and it was 170/80.  At her last visit she also had elevated systolic pressure.  She admits to eating a lot of salty foods.  We will add hydrochlorothiazide 12.5 mg one daily for blood pressure control.  We are checking fasting lab work today.  She will eat high potassium foods such as bananas to supplement her potassium as she starts taking HCTZ

## 2013-06-12 NOTE — Assessment & Plan Note (Signed)
She states that she has quit smoking for good.  She is urging her husband to quit smoking also.

## 2013-06-12 NOTE — Assessment & Plan Note (Signed)
The patient has not been experiencing any exertional chest pain.  She goes [facility] the [place] [facility] and walks laps on the track.  She has not had to take any sublingual nitroglycerin.

## 2013-06-12 NOTE — Assessment & Plan Note (Signed)
The patient has a history of hypercholesterolemia.  We're checking blood work today.  She is not presently on any lipid-lowering drugs.  She is intolerant of ezetimibe and simvastatin

## 2013-06-13 NOTE — Progress Notes (Signed)
Quick Note:  Please report to patient. The recent labs are stable. Continue same medication and careful diet. The LDL cholesterol is too high 156 for patient with known CAD. She is intolerant of Zetia and simvastatin. I would like her to try Crestor 10 mg daily. Recheck LP, HFP, BMET 2 months after starting crestor.. ______

## 2013-06-19 ENCOUNTER — Other Ambulatory Visit: Payer: Self-pay | Admitting: *Deleted

## 2013-06-19 ENCOUNTER — Telehealth: Payer: Self-pay | Admitting: *Deleted

## 2013-06-19 DIAGNOSIS — E7800 Pure hypercholesterolemia, unspecified: Secondary | ICD-10-CM

## 2013-06-19 NOTE — Telephone Encounter (Signed)
Message copied by Earvin Hansen on Wed Jun 19, 2013 10:44 AM ------      Message from: Darlin Coco      Created: Thu Jun 13, 2013  8:02 AM       Please report to patient.  The recent labs are stable. Continue same medication and careful diet. The LDL cholesterol is too high 156 for patient with known CAD.  She is intolerant of Zetia and simvastatin.  I would like her to try Crestor 10 mg daily. Recheck LP, HFP, BMET 2 months after starting crestor.[name] ------

## 2013-06-19 NOTE — Telephone Encounter (Signed)
Advised patient of lab results, samples at front desk, and follow up labs scheduled.

## 2013-06-26 ENCOUNTER — Telehealth: Payer: Self-pay | Admitting: *Deleted

## 2013-06-26 NOTE — Telephone Encounter (Signed)
New message    Coming to office to pick up sample crestor.

## 2013-06-26 NOTE — Telephone Encounter (Signed)
Samples at front desk waiting for pick up

## 2013-07-10 ENCOUNTER — Encounter: Payer: Self-pay | Admitting: Cardiology

## 2013-07-10 ENCOUNTER — Telehealth: Payer: Self-pay | Admitting: Cardiology

## 2013-07-10 ENCOUNTER — Ambulatory Visit (INDEPENDENT_AMBULATORY_CARE_PROVIDER_SITE_OTHER): Payer: 59 | Admitting: Cardiology

## 2013-07-10 ENCOUNTER — Other Ambulatory Visit: Payer: PRIVATE HEALTH INSURANCE

## 2013-07-10 VITALS — BP 132/70 | HR 61 | Ht 65.0 in | Wt 137.0 lb

## 2013-07-10 DIAGNOSIS — I2510 Atherosclerotic heart disease of native coronary artery without angina pectoris: Secondary | ICD-10-CM

## 2013-07-10 DIAGNOSIS — I359 Nonrheumatic aortic valve disorder, unspecified: Secondary | ICD-10-CM

## 2013-07-10 DIAGNOSIS — Z889 Allergy status to unspecified drugs, medicaments and biological substances status: Secondary | ICD-10-CM

## 2013-07-10 DIAGNOSIS — I259 Chronic ischemic heart disease, unspecified: Secondary | ICD-10-CM

## 2013-07-10 DIAGNOSIS — E7800 Pure hypercholesterolemia, unspecified: Secondary | ICD-10-CM

## 2013-07-10 DIAGNOSIS — I358 Other nonrheumatic aortic valve disorders: Secondary | ICD-10-CM

## 2013-07-10 DIAGNOSIS — I119 Hypertensive heart disease without heart failure: Secondary | ICD-10-CM

## 2013-07-10 LAB — BASIC METABOLIC PANEL
BUN: 15 mg/dL (ref 6–23)
CALCIUM: 9.5 mg/dL (ref 8.4–10.5)
CO2: 28 mEq/L (ref 19–32)
CREATININE: 0.8 mg/dL (ref 0.4–1.2)
Chloride: 102 mEq/L (ref 96–112)
GFR: 79.59 mL/min (ref >60.00)
Glucose, Bld: 91 mg/dL (ref 70–99)
Potassium: 3.7 mEq/L (ref 3.5–5.1)
Sodium: 138 mEq/L (ref 135–145)

## 2013-07-10 MED ORDER — CETIRIZINE HCL 10 MG PO TABS: 10.0000 mg | ORAL_TABLET | Freq: Every day | ORAL | Status: DC

## 2013-07-10 NOTE — Assessment & Plan Note (Signed)
Patient has a systolic ejection murmur at the base.  This is not causing any symptoms.  Echo shows only aortic valve sclerosis and not stenosis and we talked about that today.

## 2013-07-10 NOTE — Assessment & Plan Note (Signed)
The patient is now on low dose hydrochlorothiazide.  Her blood pressure is improving.  When she first arrived after walking a long distance from the parking lot her pressure was elevated.  Later in the exam it was down to 132/70.  We are checking a basal metabolic panel today to make sure that her potassium is okay on the hydrochlorothiazide

## 2013-07-10 NOTE — Telephone Encounter (Signed)
New message     Saw Dr Mare Ferrari today----want to talk to [place].  Pt would not tell me what she wanted.

## 2013-07-10 NOTE — Assessment & Plan Note (Signed)
The patient has had no recurrent chest pain or angina 

## 2013-07-10 NOTE — Patient Instructions (Signed)
Will obtain labs today and call you with the results (bmet)  Your physician recommends that you continue on your current medications as directed. Please refer to the Current Medication list given to you today.  Your physician wants you to follow-up in: 4 months with fasting labs (lp/bmet/hfp)  You will receive a reminder letter in the mail two months in advance. If you don't receive a letter, please call our office to schedule the follow-up appointment.

## 2013-07-10 NOTE — Progress Notes (Signed)
Katherine [name] Date of Birth:  December 11, 1945 [address] [place], [place]  [address] [phone]         Fax   [phone]  History of Present Illness: This pleasant 68 year old woman is seen for a scheduled followup office visit. She was admitted on 10/16/12 to [facility] with chest pain. She had cardiac catheterization on 10/17/12 showing significant 1 vessel coronary disease with patent stents in Katherine RCA. No evidence of obstructive disease. Left ventricular function was normal. She has a history of known left bundle branch block. She has had known ischemic heart disease. She had her initial PCI on 05/14/10 at which time she was found to have a high-grade lesion in Katherine midportion of Katherine right coronary artery. She had a normal nuclear stress test 3 days prior to her successful PCI. She has 2 overlapping drug-eluting stents and was on Effient for a year and now is on 81 mg aspirin alone. Katherine [name] had started smoking again prior to her recent hospital stay. She is now off cigarettes entirely once again She has a remote history of pneumonia for which she was hospitalized. Katherine [name] had an echocardiogram on 10/18/12 which showed normal LV systolic function, aortic valve sclerosis without stenosis, and moderate TR.  Katherine [name] also underwent carotid Dopplers on 10/18/12 which showed no obstruction. Since we last saw her she saw an allergy specialist because of intermittent skin rash on her face and was prescribed Zyrtec. She had skin patch testing and was told that she was allergic to dogs cats and mold. Since last visit she has been feeling well.  She is still a nonsmoker and is trying to get her husband to quit.   Current Outpatient Prescriptions  Medication Sig Dispense Refill  . amLODipine (NORVASC) 10 MG tablet Take 1 tablet (10 mg total) by mouth daily.  30 tablet  5  . Ascorbic Acid (VITAMIN C) 500 MG tablet Take 250 mg by mouth daily.       [name] aspirin 81 MG tablet Take 81  mg by mouth daily.       . hydrochlorothiazide (MICROZIDE) 12.5 MG capsule Take 1 capsule (12.5 mg total) by mouth daily.  90 capsule  3  . nitroGLYCERIN (NITROSTAT) 0.4 MG SL tablet Place 1 tablet (0.4 mg total) under Katherine tongue every 5 (five) minutes as needed.  25 tablet  prn  . rosuvastatin (CRESTOR) 10 MG tablet Take 10 mg by mouth daily.       No current facility-administered medications for this visit.    Allergies  Allergen Reactions  . Ezetimibe   . Isosorbide Swelling    SWELLING OF Katherine TONGUE - pt states she received this after PNA ? But has been able to tolerate SL nitroglycerin without adverse reaction.  . Metoprolol Succinate [Metoprolol]     Dizziness, diarrhea, tingling in fingers  . Plavix [Clopidogrel Bisulfate]     Prior P2Y12 showing 267 (poor Plavix responsiveness)  . Sulfa Antibiotics     Sulfa drugs as a child  . Sulfonamide Derivatives     Sulfa drugs as a child  . Zocor [Simvastatin]     Leg pain - does not tolerate high doses of statins    [name] Active Problem List   Diagnosis Date Noted  . Anginal pain     Priority: Medium  . Nosebleed     Priority: Medium  . History of hypercholesterolemia     Priority: Medium  . Tobacco  abuse     Priority: Medium  . Aortic valve sclerosis 07/10/2013  . Chest pain 10/18/2012  . Sinus bradycardia 10/18/2012  . Dizzy spells 10/18/2012  . PVC's (premature ventricular contractions) 09/25/2012  . Coronary artery disease   . 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 Left bundle-branch block   . Ischemic heart disease     History  Smoking status  . Former Smoker  Smokeless tobacco  . Not on file    Comment: Smoking for over 45 years    History  Alcohol Use No    Family History  Problem Relation Age of Onset  . Cancer Father   . Heart disease Father     Review of Systems: Constitutional: no fever chills diaphoresis or fatigue or change in weight.  Head and neck: no hearing loss,  no epistaxis, no photophobia or visual disturbance. Respiratory: No cough, shortness of breath or wheezing. Cardiovascular: No chest pain peripheral edema, palpitations. Gastrointestinal: No abdominal distention, no abdominal pain, no change in bowel habits hematochezia or melena. Genitourinary: No dysuria, no frequency, no urgency, no nocturia. Musculoskeletal:No arthralgias, no back pain, no gait disturbance or myalgias. Neurological: No dizziness, no headaches, no numbness, no seizures, no syncope, no weakness, no tremors. Hematologic: No lymphadenopathy, no easy bruising. Psychiatric: No confusion, no hallucinations, no sleep disturbance.    Physical Exam: Filed Vitals:   07/10/13 1024  BP: 132/70  Pulse:    Katherine general appearance reveals a well-developed well-nourished woman in no distress.  Repeat blood pressure by me in Katherine right arm sitting his 160/80.Katherine head and neck exam reveals pupils equal and reactive.  Extraocular movements are full.  There is no scleral icterus.  Katherine mouth and pharynx are normal.  Katherine neck is supple.  Katherine carotids reveal no bruits.  Katherine jugular venous pressure is normal.  Katherine  thyroid is not enlarged.  There is no lymphadenopathy.  Katherine chest is clear to percussion and auscultation.  There are no rales or rhonchi.  Expansion of Katherine chest is symmetrical.  Katherine precordium is quiet.  Katherine first heart sound is normal.  Katherine second heart sound is paradoxically split.  There is soft systolic ejection murmur at Katherine aortic area.  There is no abnormal lift or heave.  Katherine abdomen is soft and nontender.  Katherine bowel sounds are normal.  Katherine liver and spleen are not enlarged.  There are no abdominal masses.  There are no abdominal bruits.  Extremities reveal good pedal pulses.  There is no phlebitis or edema.  There is no cyanosis or clubbing.  Strength is normal and symmetrical in all extremities.  There is no lateralizing weakness.  There are no sensory deficits.  Katherine skin is warm  and dry.  There is no rash.     Assessment / Plan: Continue same medication.  Check basal metabolic panel today.  Recheck in 4 months for office visit lipid panel hepatic function panel and basal metabolic panel

## 2013-07-10 NOTE — Telephone Encounter (Signed)
Patient requested Rx for Zyrtec, ok per  Dr. Mare Ferrari

## 2013-07-10 NOTE — Progress Notes (Signed)
Quick Note:  Please report to patient. The recent labs are stable. Continue same medication and careful diet. ______ 

## 2013-07-12 ENCOUNTER — Telehealth: Payer: Self-pay | Admitting: *Deleted

## 2013-07-12 NOTE — Telephone Encounter (Signed)
Advised patient of lab results  

## 2013-07-12 NOTE — Telephone Encounter (Signed)
Message copied by Earvin Hansen on Fri Jul 12, 2013  2:07 PM ------      Message from: Darlin Coco      Created: Wed Jul 10, 2013  8:50 PM       Please report to patient.  The recent labs are stable. Continue same medication and careful diet. ------

## 2013-08-02 ENCOUNTER — Telehealth: Payer: Self-pay | Admitting: *Deleted

## 2013-08-02 NOTE — Telephone Encounter (Signed)
Patient requests crestor samples. I will place at the front desk for pick up.

## 2013-08-16 ENCOUNTER — Telehealth: Payer: Self-pay | Admitting: Cardiology

## 2013-08-16 ENCOUNTER — Other Ambulatory Visit: Payer: 59

## 2013-08-16 NOTE — Telephone Encounter (Signed)
New problem    Per pt:  Please give her a call, she has some questions about her medications and some other things.

## 2013-08-16 NOTE — Telephone Encounter (Signed)
Left message to call back  

## 2013-08-23 NOTE — Telephone Encounter (Signed)
Left message to call back  

## 2013-08-23 NOTE — Telephone Encounter (Signed)
Patient is returning your call. Please call back.  °

## 2013-08-26 NOTE — Telephone Encounter (Signed)
Okay to take the Crestor just MWF to avoid the severe leg problems.

## 2013-08-26 NOTE — Telephone Encounter (Signed)
F/u    Returning call from nurse.

## 2013-08-26 NOTE — Telephone Encounter (Signed)
Spoke with patient who states she had a message from Alvina Filbert, LPN on her voice mail but accidentally erased it.  I reviewed patient's chart and appointments and asked if the call was regarding a lab appointment.  I advised patient that she was scheduled for fasting labs on May 1 but did not come in - patient states she was not aware of this appointment.  Patient thought that she had an appointment with Dr. Mare Ferrari for May 20 but I advised her that Dr. Mare Ferrari is not in the office that day.  Patient rescheduled for lab appointment on 5/13.  Patient reports that the Crestor is causing leg pain like the previous statin did and so she is taking Crestor only on Monday, Wednesday, and Friday.  I advised patient that I will route message to Dr. Mare Ferrari and Rip Harbour so they will be aware.  Patient verbalized agreement and understanding.

## 2013-08-27 NOTE — Telephone Encounter (Signed)
Left message to call back  

## 2013-08-28 ENCOUNTER — Telehealth: Payer: Self-pay | Admitting: Cardiology

## 2013-08-28 ENCOUNTER — Other Ambulatory Visit: Payer: 59

## 2013-08-28 NOTE — Telephone Encounter (Signed)
Scheduled ov and labs for patient

## 2013-08-28 NOTE — Telephone Encounter (Signed)
Advised patient okay

## 2013-08-28 NOTE — Telephone Encounter (Signed)
New Message:  Pt states she is returning a call to [facility]

## 2013-08-30 ENCOUNTER — Other Ambulatory Visit (INDEPENDENT_AMBULATORY_CARE_PROVIDER_SITE_OTHER): Payer: 59

## 2013-08-30 DIAGNOSIS — E7800 Pure hypercholesterolemia, unspecified: Secondary | ICD-10-CM

## 2013-08-30 LAB — LIPID PANEL
CHOLESTEROL: 161 mg/dL (ref 0–200)
HDL: 50 mg/dL (ref >39.00)
LDL Cholesterol: 98 mg/dL (ref 0–99)
TRIGLYCERIDES: 67 mg/dL (ref 0.0–149.0)
Total CHOL/HDL Ratio: 3
VLDL: 13.4 mg/dL (ref 0.0–40.0)

## 2013-08-30 LAB — BASIC METABOLIC PANEL
BUN: 14 mg/dL (ref 6–23)
CALCIUM: 9.2 mg/dL (ref 8.4–10.5)
CO2: 28 meq/L (ref 19–32)
Chloride: 106 mEq/L (ref 96–112)
Creatinine, Ser: 0.7 mg/dL (ref 0.4–1.2)
GFR: 83.29 mL/min (ref >60.00)
Glucose, Bld: 84 mg/dL (ref 70–99)
Potassium: 4 mEq/L (ref 3.5–5.1)
SODIUM: 141 meq/L (ref 135–145)

## 2013-08-30 LAB — HEPATIC FUNCTION PANEL
ALBUMIN: 3.8 g/dL (ref 3.5–5.2)
ALT: 15 U/L (ref 0–35)
AST: 21 U/L (ref 0–37)
Alkaline Phosphatase: 50 U/L (ref 39–117)
Bilirubin, Direct: 0.1 mg/dL (ref 0.0–0.3)
TOTAL PROTEIN: 7.1 g/dL (ref 6.0–8.3)
Total Bilirubin: 0.7 mg/dL (ref 0.2–1.2)

## 2013-08-31 NOTE — Progress Notes (Signed)
Quick Note:  Please report to patient. The recent labs are stable. Continue same medication and careful diet. Cholesterol much better. ______

## 2013-09-02 ENCOUNTER — Telehealth: Payer: Self-pay | Admitting: Cardiology

## 2013-09-02 NOTE — Telephone Encounter (Signed)
Message copied by Earvin Hansen on Mon Sep 02, 2013  9:13 AM ------      Message from: Darlin Coco      Created: Sat Aug 31, 2013  7:45 PM       Please report to patient.  The recent labs are stable. Continue same medication and careful diet. Cholesterol much better. ------

## 2013-09-02 NOTE — Telephone Encounter (Signed)
Advised patient of lab results  

## 2013-09-02 NOTE — Telephone Encounter (Signed)
New message ° ° ° ° °Returning Melinda's call  ° °

## 2013-10-02 ENCOUNTER — Other Ambulatory Visit: Payer: Self-pay | Admitting: *Deleted

## 2013-10-02 MED ORDER — AMLODIPINE BESYLATE 10 MG PO TABS: 10.0000 mg | ORAL_TABLET | Freq: Every day | ORAL | Status: DC

## 2013-10-16 ENCOUNTER — Telehealth: Payer: Self-pay

## 2013-10-16 NOTE — Telephone Encounter (Signed)
PATIENT CALLED FOR SAMPLES OF CRESTOR PLACED SAMPLES UP FRONT

## 2013-10-24 ENCOUNTER — Telehealth: Payer: Self-pay

## 2013-10-24 NOTE — Telephone Encounter (Signed)
Called patient to let her know that i placed samples of crestor up front for her

## 2013-11-08 ENCOUNTER — Ambulatory Visit (INDEPENDENT_AMBULATORY_CARE_PROVIDER_SITE_OTHER): Payer: 59 | Admitting: Cardiology

## 2013-11-08 ENCOUNTER — Encounter: Payer: Self-pay | Admitting: Cardiology

## 2013-11-08 VITALS — BP 134/70 | HR 60 | Ht 65.0 in | Wt 133.0 lb

## 2013-11-08 DIAGNOSIS — I259 Chronic ischemic heart disease, unspecified: Secondary | ICD-10-CM

## 2013-11-08 DIAGNOSIS — Z720 Tobacco use: Secondary | ICD-10-CM

## 2013-11-08 DIAGNOSIS — Z862 Personal history of diseases of the blood and blood-forming organs and certain disorders involving the immune mechanism: Secondary | ICD-10-CM

## 2013-11-08 DIAGNOSIS — I209 Angina pectoris, unspecified: Secondary | ICD-10-CM

## 2013-11-08 DIAGNOSIS — Z8639 Personal history of other endocrine, nutritional and metabolic disease: Secondary | ICD-10-CM

## 2013-11-08 DIAGNOSIS — E7800 Pure hypercholesterolemia, unspecified: Secondary | ICD-10-CM

## 2013-11-08 DIAGNOSIS — I119 Hypertensive heart disease without heart failure: Secondary | ICD-10-CM

## 2013-11-08 DIAGNOSIS — F17200 Nicotine dependence, unspecified, uncomplicated: Secondary | ICD-10-CM

## 2013-11-08 NOTE — Assessment & Plan Note (Signed)
Blood pressure has been remaining stable on current therapy. 

## 2013-11-08 NOTE — Progress Notes (Signed)
Katherine [name] Date of Birth:  10-30-1945 [facility] [address] [place], [place]  [address] [phone]        Fax   [phone]   History of Present Illness: This pleasant 68 year old woman is seen for a scheduled followup office visit. She was admitted on 10/16/12 to [facility] with chest pain. She had cardiac catheterization on 10/17/12 showing significant 1 vessel coronary disease with patent stents in the RCA. No evidence of obstructive disease. Left ventricular function was normal. She has a history of known left bundle branch block. She has had known ischemic heart disease. She had her initial PCI on 05/14/10 at which time she was found to have a high-grade lesion in the midportion of the right coronary artery. She had a normal nuclear stress test 3 days prior to her successful PCI. She has 2 overlapping drug-eluting stents and was on Effient for a year and now is on 81 mg aspirin alone. The patient had started smoking again prior to her recent hospital stay. She is now off cigarettes entirely once again She has a remote history of pneumonia for which she was hospitalized.  The patient had an echocardiogram on 10/18/12 which showed normal LV systolic function, aortic valve sclerosis without stenosis, and moderate TR. The patient also underwent carotid Dopplers on 10/18/12 which showed no obstruction.     Current Outpatient Prescriptions  Medication Sig Dispense Refill  . amLODipine (NORVASC) 10 MG tablet Take 1 tablet (10 mg total) by mouth daily.  30 tablet  1  . Ascorbic Acid (VITAMIN C) 500 MG tablet Take 250 mg by mouth daily.       [name] aspirin 81 MG tablet Take 81 mg by mouth daily.       . Coenzyme Q10 200 MG TABS Take by mouth daily.      . hydrochlorothiazide (MICROZIDE) 12.5 MG capsule Take 1 capsule (12.5 mg total) by mouth daily.  90 capsule  3  . nitroGLYCERIN (NITROSTAT) 0.4 MG SL tablet Place 1 tablet (0.4 mg total) under the tongue every 5 (five)  minutes as needed.  25 tablet  prn  . rosuvastatin (CRESTOR) 10 MG tablet Take 10 mg by mouth as directed. Monday, Wednesday, and Friday only      . cetirizine (ZYRTEC) 10 MG tablet Take 1 tablet (10 mg total) by mouth daily.  30 tablet  5   No current facility-administered medications for this visit.    Allergies  Allergen Reactions  . Ezetimibe   . Isosorbide Swelling    SWELLING OF THE TONGUE - pt states she received this after PNA ? But has been able to tolerate SL nitroglycerin without adverse reaction.  . Metoprolol Succinate [Metoprolol]     Dizziness, diarrhea, tingling in fingers  . Plavix [Clopidogrel Bisulfate]     Prior P2Y12 showing 267 (poor Plavix responsiveness)  . Sulfa Antibiotics     Sulfa drugs as a child  . Sulfonamide Derivatives     Sulfa drugs as a child  . Zocor [Simvastatin]     Leg pain - does not tolerate high doses of statins    Patient Active Problem List   Diagnosis Date Noted  . Anginal pain     Priority: Medium  . Nosebleed     Priority: Medium  . History of hypercholesterolemia     Priority: Medium  . Tobacco abuse     Priority: Medium  . Aortic valve sclerosis 07/10/2013  .  Chest pain 10/18/2012  . Sinus bradycardia 10/18/2012  . Dizzy spells 10/18/2012  . PVC's (premature ventricular contractions) 09/25/2012  . Coronary artery disease   . 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 Left bundle-branch block   . Ischemic heart disease     History  Smoking status  . Former Smoker  Smokeless tobacco  . Not on file    Comment: Smoking for over 45 years    History  Alcohol Use No    Family History  Problem Relation Age of Onset  . Cancer Father   . Heart disease Father     Review of Systems: Constitutional: no fever chills diaphoresis or fatigue or change in weight.  Head and neck: no hearing loss, no epistaxis, no photophobia or visual disturbance. Respiratory: No cough, shortness of breath or  wheezing. Cardiovascular: No chest pain peripheral edema, palpitations. Gastrointestinal: No abdominal distention, no abdominal pain, no change in bowel habits hematochezia or melena. Genitourinary: No dysuria, no frequency, no urgency, no nocturia. Musculoskeletal:No arthralgias, no back pain, no gait disturbance or myalgias. Neurological: No dizziness, no headaches, no numbness, no seizures, no syncope, no weakness, no tremors. Hematologic: No lymphadenopathy, no easy bruising. Psychiatric: No confusion, no hallucinations, no sleep disturbance.    Physical Exam: Filed Vitals:   11/08/13 1141  BP: 134/70  Pulse: 60   the general appearance reveals a well-developed well-nourished woman in no distress.The head and neck exam reveals pupils equal and reactive.  Extraocular movements are full.  There is no scleral icterus.  The mouth and pharynx are normal.  The neck is supple.  The carotids reveal no bruits.  The jugular venous pressure is normal.  The  thyroid is not enlarged.  There is no lymphadenopathy.  The chest is clear to percussion and auscultation.  There are no rales or rhonchi.  Expansion of the chest is symmetrical.  The precordium is quiet.  The first heart sound is normal.  The second heart sound is physiologically split.  There is grade 1/6 systolic ejection murmur at the base.  There is no abnormal lift or heave.  The abdomen is soft and nontender.  The bowel sounds are normal.  The liver and spleen are not enlarged.  There are no abdominal masses.  There are no abdominal bruits.  Extremities reveal good pedal pulses.  There is no phlebitis or edema.  There is no cyanosis or clubbing.  Strength is normal and symmetrical in all extremities.  There is no lateralizing weakness.  There are no sensory deficits.  The skin is warm and dry.  There is no rash.     Assessment / Plan: 1.  Ischemic heart disease status post PCI of right coronary artery on 05/14/10 2. aortic valve sclerosis  without stenosis and moderate TR 3. Hypercholesterolemia 4. essential hypertension without heart failure  Plan: Continue same medication.  Recheck in 4 months for office visit lipid panel hepatic function panel and basal metabolic panel.

## 2013-11-08 NOTE — Assessment & Plan Note (Signed)
The patient remains a nonsmoker.  Her husband is also cutting back on his smoking but has not quit altogether

## 2013-11-08 NOTE — Patient Instructions (Addendum)
START COENZYME Q10 200 MG DAILY  Your physician recommends that you schedule a follow-up appointment in: 4 months with fasting labs (lp/bmet/hfp)

## 2013-11-08 NOTE — Assessment & Plan Note (Signed)
The patient has not had any recurrent chest pain or angina. 

## 2013-11-08 NOTE — Assessment & Plan Note (Signed)
We reviewed her fasting lab work from May 2015.  She has had a good initial response from the Crestor.  She is able to take the Crestor just 3 days a week because of myalgias.  She will try taking coenzyme Q10 200 mg daily to see if that may help the mild myalgias.

## 2013-11-13 ENCOUNTER — Encounter (HOSPITAL_COMMUNITY): Payer: Self-pay | Admitting: Emergency Medicine

## 2013-11-13 ENCOUNTER — Inpatient Hospital Stay (HOSPITAL_COMMUNITY)
Admission: EM | Admit: 2013-11-13 | Discharge: 2013-11-15 | DRG: 243 | Disposition: A | Discharge status: Home / Self Care | Payer: PRIVATE HEALTH INSURANCE | Attending: Cardiovascular Disease | Admitting: Cardiovascular Disease

## 2013-11-13 ENCOUNTER — Emergency Department (HOSPITAL_COMMUNITY): Payer: PRIVATE HEALTH INSURANCE

## 2013-11-13 DIAGNOSIS — Z8249 Family history of ischemic heart disease and other diseases of the circulatory system: Secondary | ICD-10-CM | POA: Diagnosis not present

## 2013-11-13 DIAGNOSIS — Z881 Allergy status to other antibiotic agents status: Secondary | ICD-10-CM

## 2013-11-13 DIAGNOSIS — I2510 Atherosclerotic heart disease of native coronary artery without angina pectoris: Secondary | ICD-10-CM | POA: Diagnosis present

## 2013-11-13 DIAGNOSIS — M549 Dorsalgia, unspecified: Secondary | ICD-10-CM | POA: Diagnosis present

## 2013-11-13 DIAGNOSIS — Z9861 Coronary angioplasty status: Secondary | ICD-10-CM

## 2013-11-13 DIAGNOSIS — Z888 Allergy status to other drugs, medicaments and biological substances status: Secondary | ICD-10-CM | POA: Diagnosis not present

## 2013-11-13 DIAGNOSIS — I10 Essential (primary) hypertension: Secondary | ICD-10-CM | POA: Diagnosis present

## 2013-11-13 DIAGNOSIS — I4949 Other premature depolarization: Secondary | ICD-10-CM

## 2013-11-13 DIAGNOSIS — I2089 Other forms of angina pectoris: Secondary | ICD-10-CM

## 2013-11-13 DIAGNOSIS — I209 Angina pectoris, unspecified: Secondary | ICD-10-CM

## 2013-11-13 DIAGNOSIS — I447 Left bundle-branch block, unspecified: Secondary | ICD-10-CM

## 2013-11-13 DIAGNOSIS — E785 Hyperlipidemia, unspecified: Secondary | ICD-10-CM | POA: Diagnosis present

## 2013-11-13 DIAGNOSIS — Z7982 Long term (current) use of aspirin: Secondary | ICD-10-CM

## 2013-11-13 DIAGNOSIS — I495 Sick sinus syndrome: Secondary | ICD-10-CM | POA: Diagnosis present

## 2013-11-13 DIAGNOSIS — Z882 Allergy status to sulfonamides status: Secondary | ICD-10-CM

## 2013-11-13 DIAGNOSIS — F17200 Nicotine dependence, unspecified, uncomplicated: Secondary | ICD-10-CM | POA: Diagnosis present

## 2013-11-13 DIAGNOSIS — I358 Other nonrheumatic aortic valve disorders: Secondary | ICD-10-CM

## 2013-11-13 DIAGNOSIS — I493 Ventricular premature depolarization: Secondary | ICD-10-CM

## 2013-11-13 DIAGNOSIS — I498 Other specified cardiac arrhythmias: Secondary | ICD-10-CM

## 2013-11-13 DIAGNOSIS — E876 Hypokalemia: Secondary | ICD-10-CM | POA: Diagnosis not present

## 2013-11-13 DIAGNOSIS — R001 Bradycardia, unspecified: Secondary | ICD-10-CM | POA: Diagnosis present

## 2013-11-13 DIAGNOSIS — I779 Disorder of arteries and arterioles, unspecified: Secondary | ICD-10-CM | POA: Diagnosis present

## 2013-11-13 DIAGNOSIS — I359 Nonrheumatic aortic valve disorder, unspecified: Secondary | ICD-10-CM | POA: Diagnosis present

## 2013-11-13 DIAGNOSIS — I442 Atrioventricular block, complete: Secondary | ICD-10-CM | POA: Diagnosis present

## 2013-11-13 DIAGNOSIS — E7800 Pure hypercholesterolemia, unspecified: Secondary | ICD-10-CM | POA: Diagnosis present

## 2013-11-13 DIAGNOSIS — I25119 Atherosclerotic heart disease of native coronary artery with unspecified angina pectoris: Secondary | ICD-10-CM

## 2013-11-13 DIAGNOSIS — I739 Peripheral vascular disease, unspecified: Secondary | ICD-10-CM

## 2013-11-13 DIAGNOSIS — I119 Hypertensive heart disease without heart failure: Secondary | ICD-10-CM | POA: Diagnosis present

## 2013-11-13 DIAGNOSIS — Z9289 Personal history of other medical treatment: Secondary | ICD-10-CM

## 2013-11-13 DIAGNOSIS — D472 Monoclonal gammopathy: Secondary | ICD-10-CM | POA: Diagnosis present

## 2013-11-13 DIAGNOSIS — M546 Pain in thoracic spine: Secondary | ICD-10-CM

## 2013-11-13 DIAGNOSIS — I208 Other forms of angina pectoris: Secondary | ICD-10-CM

## 2013-11-13 HISTORY — DX: Hyperlipidemia, unspecified: E78.5

## 2013-11-13 HISTORY — DX: Personal history of other medical treatment: Z92.89

## 2013-11-13 HISTORY — DX: Sick sinus syndrome: I49.5

## 2013-11-13 LAB — CBC
HCT: 37.8 % (ref 36.0–46.0)
Hemoglobin: 12.3 g/dL (ref 12.0–15.0)
MCH: 30.8 pg (ref 26.0–34.0)
MCHC: 32.5 g/dL (ref 30.0–36.0)
MCV: 94.5 fL (ref 78.0–100.0)
PLATELETS: 276 10*3/uL (ref 150–400)
RBC: 4 MIL/uL (ref 3.87–5.11)
RDW: 14.7 % (ref 11.5–15.5)
WBC: 4.9 10*3/uL (ref 4.0–10.5)

## 2013-11-13 LAB — COMPREHENSIVE METABOLIC PANEL
ALK PHOS: 51 U/L (ref 39–117)
ALT: 10 U/L (ref 0–35)
AST: 19 U/L (ref 0–37)
Albumin: 3.9 g/dL (ref 3.5–5.2)
Anion gap: 16 — ABNORMAL HIGH (ref 5–15)
BILIRUBIN TOTAL: 0.3 mg/dL (ref 0.3–1.2)
BUN: 11 mg/dL (ref 6–23)
CALCIUM: 8.9 mg/dL (ref 8.4–10.5)
CHLORIDE: 104 meq/L (ref 96–112)
CO2: 23 meq/L (ref 19–32)
Creatinine, Ser: 0.63 mg/dL (ref 0.50–1.10)
GFR calc Af Amer: >90 mL/min (ref >90)
GFR calc non Af Amer: >90 mL/min (ref >90)
GLUCOSE: 87 mg/dL (ref 70–99)
Potassium: 3.8 mEq/L (ref 3.7–5.3)
SODIUM: 143 meq/L (ref 137–147)
Total Protein: 7.1 g/dL (ref 6.0–8.3)

## 2013-11-13 LAB — TSH: TSH: 2.44 u[IU]/mL (ref 0.350–4.500)

## 2013-11-13 LAB — PROTIME-INR
INR: 1.02 (ref 0.00–1.49)
Prothrombin Time: 13.4 seconds (ref 11.6–15.2)

## 2013-11-13 LAB — I-STAT TROPONIN, ED: Troponin i, poc: 0 ng/mL (ref 0.00–0.08)

## 2013-11-13 LAB — MAGNESIUM: MAGNESIUM: 1.9 mg/dL (ref 1.5–2.5)

## 2013-11-13 LAB — MRSA PCR SCREENING: MRSA BY PCR: NEGATIVE

## 2013-11-13 LAB — TROPONIN I: Troponin I: <0.3 ng/mL (ref <0.30)

## 2013-11-13 MED ORDER — NITROGLYCERIN 0.4 MG SL SUBL: 0.4000 mg | SUBLINGUAL_TABLET | SUBLINGUAL | Status: DC | PRN

## 2013-11-13 MED ORDER — ASPIRIN 300 MG RE SUPP
300.0000 mg | RECTAL | Status: DC
  Filled 2013-11-13: qty 1

## 2013-11-13 MED ORDER — ATROPINE SULFATE 1 MG/ML IJ SOLN
0.5000 mg | Freq: Once | INTRAMUSCULAR | Status: AC
  Administered 2013-11-13: 0.5 mg via INTRAVENOUS

## 2013-11-13 MED ORDER — SODIUM CHLORIDE 0.9 % IV SOLN
INTRAVENOUS | Status: DC
  Administered 2013-11-13 – 2013-11-14 (×2): via INTRAVENOUS

## 2013-11-13 MED ORDER — CHLORHEXIDINE GLUCONATE 4 % EX LIQD
60.0000 mL | Freq: Once | CUTANEOUS | Status: AC
  Administered 2013-11-13: 4 via TOPICAL
  Filled 2013-11-13: qty 15

## 2013-11-13 MED ORDER — ATORVASTATIN CALCIUM 20 MG PO TABS
20.0000 mg | ORAL_TABLET | Freq: Every day | ORAL | Status: DC
  Administered 2013-11-13: 20 mg via ORAL
  Filled 2013-11-13 (×3): qty 1

## 2013-11-13 MED ORDER — ONDANSETRON HCL 4 MG/2ML IJ SOLN: 4.0000 mg | Freq: Four times a day (QID) | INTRAMUSCULAR | Status: DC | PRN

## 2013-11-13 MED ORDER — SODIUM CHLORIDE 0.9 % IR SOLN
80.0000 mg | Status: DC
  Filled 2013-11-13: qty 2

## 2013-11-13 MED ORDER — ASPIRIN EC 81 MG PO TBEC
81.0000 mg | DELAYED_RELEASE_TABLET | Freq: Every day | ORAL | Status: DC
  Administered 2013-11-14: 81 mg via ORAL
  Filled 2013-11-13 (×2): qty 1

## 2013-11-13 MED ORDER — CHLORHEXIDINE GLUCONATE 4 % EX LIQD
60.0000 mL | Freq: Once | CUTANEOUS | Status: AC
  Administered 2013-11-14: 4 via TOPICAL
  Filled 2013-11-13: qty 60

## 2013-11-13 MED ORDER — ASPIRIN 325 MG PO TABS
325.0000 mg | ORAL_TABLET | Freq: Once | ORAL | Status: AC
  Administered 2013-11-13: 325 mg via ORAL
  Filled 2013-11-13: qty 1

## 2013-11-13 MED ORDER — HEPARIN SODIUM (PORCINE) 5000 UNIT/ML IJ SOLN
5000.0000 [IU] | Freq: Three times a day (TID) | INTRAMUSCULAR | Status: DC
  Administered 2013-11-13 – 2013-11-14 (×2): 5000 [IU] via SUBCUTANEOUS
  Filled 2013-11-13 (×4): qty 1

## 2013-11-13 MED ORDER — ATROPINE SULFATE 0.1 MG/ML IJ SOLN
1.0000 mg | INTRAMUSCULAR | Status: DC | PRN
  Filled 2013-11-13: qty 10

## 2013-11-13 MED ORDER — ASPIRIN 81 MG PO CHEW: 324.0000 mg | CHEWABLE_TABLET | ORAL | Status: DC

## 2013-11-13 MED ORDER — CHOLECALCIFEROL 10 MCG (400 UNIT) PO TABS
400.0000 [IU] | ORAL_TABLET | Freq: Every day | ORAL | Status: DC
  Administered 2013-11-14: 400 [IU] via ORAL
  Filled 2013-11-13 (×3): qty 1

## 2013-11-13 MED ORDER — SODIUM CHLORIDE 0.9 % IV SOLN: INTRAVENOUS | Status: DC

## 2013-11-13 MED ORDER — ACETAMINOPHEN 325 MG PO TABS: 650.0000 mg | ORAL_TABLET | ORAL | Status: DC | PRN

## 2013-11-13 MED ORDER — CYCLOBENZAPRINE HCL 5 MG PO TABS
5.0000 mg | ORAL_TABLET | Freq: Three times a day (TID) | ORAL | Status: DC | PRN
  Filled 2013-11-13: qty 1

## 2013-11-13 MED ORDER — ATROPINE SULFATE 0.1 MG/ML IJ SOLN
INTRAMUSCULAR | Status: AC
  Filled 2013-11-13: qty 10

## 2013-11-13 MED ORDER — NITROGLYCERIN 0.4 MG SL SUBL
0.4000 mg | SUBLINGUAL_TABLET | SUBLINGUAL | Status: DC | PRN
  Administered 2013-11-13: 0.4 mg via SUBLINGUAL
  Filled 2013-11-13: qty 1

## 2013-11-13 MED ORDER — MORPHINE SULFATE 4 MG/ML IJ SOLN
4.0000 mg | Freq: Once | INTRAMUSCULAR | Status: AC
  Administered 2013-11-13: 4 mg via INTRAVENOUS
  Filled 2013-11-13: qty 1

## 2013-11-13 MED ORDER — TRAMADOL HCL 50 MG PO TABS
50.0000 mg | ORAL_TABLET | Freq: Four times a day (QID) | ORAL | Status: DC | PRN
  Administered 2013-11-15: 50 mg via ORAL
  Filled 2013-11-13: qty 1

## 2013-11-13 MED ORDER — ASPIRIN 81 MG PO TABS: 81.0000 mg | ORAL_TABLET | Freq: Every day | ORAL | Status: DC

## 2013-11-13 MED ORDER — CEFAZOLIN SODIUM-DEXTROSE 2-3 GM-% IV SOLR
2.0000 g | INTRAVENOUS | Status: DC
  Filled 2013-11-13 (×2): qty 50

## 2013-11-13 NOTE — ED Notes (Signed)
Patient states started having back pain mid to upper.  Patient denies injury.  Patient denies any other symptoms.  Patient states movement doesn't worsen it.   Patient states she took ibuprofen and aleve with no relief.

## 2013-11-13 NOTE — ED Provider Notes (Signed)
CSN: [id number]     Arrival date & time 11/13/13  1121 History   First MD Initiated Contact with Patient 11/13/13 1312     Chief Complaint  Patient presents with  . Back Pain     (Consider location/radiation/quality/duration/timing/severity/associated sxs/prior Treatment) Patient is a 68 y.o. female presenting with back pain. The history is provided by the patient.  Back Pain Location:  Thoracic spine Quality:  Stabbing Radiates to:  Does not radiate Pain severity:  Moderate Pain is:  Same all the time Onset quality:  Sudden Duration:  2 days Timing:  Constant Progression:  Worsening Chronicity:  New Context: not falling, not occupational injury, not recent illness and not recent injury   Relieved by:  Nothing Worsened by:  Nothing tried Associated symptoms: chest pain   Associated symptoms: no abdominal pain, no fever, no numbness, no paresthesias and no weakness     Past Medical History  Diagnosis Date  . Coronary artery disease     a. s/p overlapping DES to RCA 04/2010 (days after neg stress test). b. Cath 06/2010 - patent stents, nonobst dz otherwise;  c. 10/2012 Cath: LM nl, LAD 69m, D1/2/3 nl, LCX 20p, OM2 50p, RCA 20% ISR mid, EF 55-60% ->Med Rx.  [name] Nosebleed   . Hypertension   . History of hypercholesterolemia     Statin intolerant  . Left bundle-branch block   . Tobacco abuse   . Sinus bradycardia     a. In past prevented BB use.  . Monoclonal gammopathy of undetermined significance     a. Previously seen by heme - this is per chart but pt does not recall.  [name] PVC's (premature ventricular contractions)   . Carotid artery disease     a. Hx r carotid bruit - dopplers 2009 <50% bilat;  b. 10/2012 U/S: no hemodynamically signif stenosis.   Past Surgical History  Procedure Laterality Date  . Pci      HIGH GRADE LESION IN THE MIDPORTION OF THE RIGHT CORONARY ARTERY WHICH WAS A LARGE ARTERY  . Cardiac catheterization  05/14/10    SHOWED EVIDENCE OF REVERSIBLE  VASOSPASM   Family History  Problem Relation Age of Onset  . Cancer Father   . Heart disease Father    History  Substance Use Topics  . Smoking status: Former Research scientist (life sciences)  . Smokeless tobacco: Not on file     Comment: Smoking for over 45 years  . Alcohol Use: No   OB History   Grav Para Term Preterm Abortions TAB SAB Ect Mult Living                 Review of Systems  Constitutional: Negative for fever and chills.  Respiratory: Negative for cough and shortness of breath.   Cardiovascular: Positive for chest pain. Negative for leg swelling.  Gastrointestinal: Negative for vomiting and abdominal pain.  Musculoskeletal: Positive for back pain.  Neurological: Negative for weakness, numbness and paresthesias.  All other systems reviewed and are negative.     Allergies  Ezetimibe; Isosorbide; Metoprolol succinate; Plavix; Sulfa antibiotics; Sulfonamide derivatives; and Zocor  Home Medications   Prior to Admission medications   Medication Sig Start Date End Date Taking? Authorizing Provider  amLODipine (NORVASC) 10 MG tablet Take 1 tablet (10 mg total) by mouth daily. 10/02/13  Yes Darlin Coco, MD  Ascorbic Acid (VITAMIN C) 500 MG tablet Take 250 mg by mouth daily.    Yes Historical Provider, MD  aspirin 81 MG tablet Take 81 mg  by mouth daily.    Yes Historical Provider, MD  Cholecalciferol (VITAMIN D PO) Take 1 tablet by mouth daily.   Yes Historical Provider, MD  hydrochlorothiazide (MICROZIDE) 12.5 MG capsule Take 1 capsule (12.5 mg total) by mouth daily. 06/12/13  Yes Darlin Coco, MD  nitroGLYCERIN (NITROSTAT) 0.4 MG SL tablet Place 0.4 mg under the tongue every 5 (five) minutes as needed for chest pain.   Yes Historical Provider, MD  rosuvastatin (CRESTOR) 10 MG tablet Take 10 mg by mouth as directed. Monday, Wednesday, and Friday only   Yes Historical Provider, MD   BP 173/69  Pulse 49  Temp(Src) 97.5 F (36.4 C) (Oral)  Resp 18  Ht 5\' 5"  (1.651 m)  Wt 133 lb  (60.328 kg)  BMI 22.13 kg/m2  SpO2 100% Physical Exam  Nursing note and vitals reviewed. Constitutional: She is oriented to person, place, and time. She appears well-developed and well-nourished. No distress.  HENT:  Head: Normocephalic and atraumatic.  Mouth/Throat: Oropharynx is clear and moist.  Eyes: EOM are normal. Pupils are equal, round, and reactive to light.  Neck: Normal range of motion. Neck supple.  Cardiovascular: Normal rate and regular rhythm.  Exam reveals no friction rub.   No murmur heard. Pulmonary/Chest: Effort normal and breath sounds normal. No respiratory distress. She has no wheezes. She has no rales.  Abdominal: Soft. She exhibits no distension. There is no tenderness. There is no rebound.  Musculoskeletal: Normal range of motion. She exhibits no edema.       Thoracic back: She exhibits no tenderness and no spasm.  Neurological: She is alert and oriented to person, place, and time.  Skin: She is not diaphoretic.    ED Course  Procedures (including critical care time) Labs Review Labs Reviewed  COMPREHENSIVE METABOLIC PANEL - Abnormal; Notable for the following:    Anion gap 16 (*)    All other components within normal limits  CBC  I-STAT TROPOININ, ED    Imaging Review Dg Chest 2 View  11/13/2013   CLINICAL DATA:  Chest and right back pain since yesterday. History of hypertension.  EXAM: CHEST  2 VIEW  COMPARISON:  10/16/2012 and 06/17/2010.  FINDINGS: The heart size and mediastinal contours are stable. There is stable mild interstitial prominence at both lung bases and stable right apical scarring. No confluent airspace opacity, pleural effusion or pneumothorax is seen. The osseous structures appear unchanged.  IMPRESSION: Stable chest.  No acute cardiopulmonary process.   Electronically Signed   By: Camie Patience M.D.   On: 11/13/2013 14:19     EKG Interpretation   Date/Time:  Wednesday November 13 2013 13:19:38 EDT Ventricular Rate:  46 PR Interval:   179 QRS Duration: 147 QT Interval:  534 QTC Calculation: 467 R Axis:   -78 Text Interpretation:  Sinus bradycardia Left bundle branch block Baseline  wander in lead(s) V3 Bradycardia similar to prior Confirmed by Mingo Amber  MD,  [place] ([id number]) on 11/13/2013 1:42:01 PM      MDM   Final diagnoses:  Right-sided thoracic back pain  Bradycardia    67 rolled female presents with right upper back pain. Began while at rest. Also having some right chest and breast pain. No eating or exacerbating factors. History of 2 stents in 2013 by Dr. Mare Ferrari. Has never had this pain before. Cannot murmur or her pain she had when she had heart attack light. For shortness of breath, fever, cough, nausea, vomiting. Here vitals are stable. Lungs are clear.  No muscular tenderness in her right back. No right upper quadrant tenderness. Concern for atypical chest pain. EKG with sinus bradycardia with rates in the 40s, similar to prior. Will check troponins, give ASA and NTG. Labs ok. Cards consulted and will admit.   Osvaldo Shipper, MD 11/13/13 [phone]

## 2013-11-13 NOTE — ED Notes (Signed)
Cro[place], MD at bedside

## 2013-11-13 NOTE — H&P (Signed)
Reason for consult: Back pain  Referring MD: ER MD  Katherine [name] is an 68 y.o. female.  Primary Cardiologist: Darlin Coco, MD  Chief Complaint: Back pain, now feels terrible  HPI: 68 year old female with of being admitted on 10/16/12 to [facility] with chest pain. She had cardiac catheterization on 10/17/12 showing significant 1 vessel coronary disease with patent stents in the RCA. No evidence of obstructive disease. Left ventricular function was normal. She has a history of known left bundle branch block. She has had known ischemic heart disease. She had her initial PCI on 05/14/10 at which time she was found to have a high-grade lesion in the midportion of the right coronary artery. She had a normal nuclear stress test 3 days prior to her successful PCI. She has 2 overlapping drug-eluting stents and was on Effient for a year and now is on 81 mg aspirin alone. The patient had started smoking again prior to her recent hospital stay. She is now off cigarettes entirely once again.  The patient had an echocardiogram on 10/18/12 which showed normal LV systolic function, aortic valve sclerosis without stenosis, and moderate TR. The patient also underwent carotid Dopplers on 10/18/12 which showed no obstruction.  She presents today with back pain- EKG with HR of 46. S BRADY and LBBB. On previous EKGs her HR has been in the 50s. She is on no rate lowering medications. Now after morphine her HR is down to 30s and she is nauseated.  Her pain originated rt breast then through to rt back scapula area. Started on Monday, prior to that she had been feeling well. The pain progressed and today she was lightheaded, weak, just feeling bad. This is different from cardiac pain With previous stents. Troponin is negative.  Past Medical History   Diagnosis  Date   .  Coronary artery disease      a. s/p overlapping DES to RCA 04/2010 (days after neg stress test). b. Cath 06/2010 - patent stents, nonobst dz otherwise; c.  10/2012 Cath: LM nl, LAD 7m D1/2/3 nl, LCX 20p, OM2 50p, RCA 20% ISR mid, EF 55-60% ->Med Rx.   .[name] Nosebleed    .  Hypertension    .  History of hypercholesterolemia      Statin intolerant   .  Left bundle-branch block    .  Tobacco abuse    .  Sinus bradycardia      a. In past prevented BB use.   .  Monoclonal gammopathy of undetermined significance      a. Previously seen by heme - this is per chart but pt does not recall.   .[name] PVC's (premature ventricular contractions)    .  Carotid artery disease      a. Hx r carotid bruit - dopplers 2009 <50% bilat; b. 10/2012 U/S: no hemodynamically signif stenosis.    Past Surgical History   Procedure  Laterality  Date   .  Pci       HIGH GRADE LESION IN THE MIDPORTION OF THE RIGHT CORONARY ARTERY WHICH WAS A LARGE ARTERY   .  Cardiac catheterization   05/14/10     SHOWED EVIDENCE OF REVERSIBLE VASOSPASM    Family History   Problem  Relation  Age of Onset   .  Cancer  Father    .  Heart disease  Father     Social History: reports that she has quit smoking. She does not have any  smokeless tobacco history on file. She reports that she does not drink alcohol or use illicit drugs.  Allergies:  Allergies   Allergen  Reactions   .  Ezetimibe  Other (See Comments)     unknown   .  Isosorbide  Swelling     SWELLING OF THE TONGUE - pt states she received this after PNA ? But has been able to tolerate SL nitroglycerin without adverse reaction.   .  Metoprolol Succinate [Metoprolol]      Dizziness, diarrhea, tingling in fingers   .  Plavix [Clopidogrel Bisulfate]      Prior P2Y12 showing 267 (poor Plavix responsiveness)   .  Sulfa Antibiotics      Sulfa drugs as a child   .  Sulfonamide Derivatives      Sulfa drugs as a child   .  Zocor [Simvastatin]      Leg pain - does not tolerate high doses of statins    No current facility-administered medications on file prior to encounter.    Current Outpatient Prescriptions on File Prior to  Encounter   Medication  Sig  Dispense  Refill   .  amLODipine (NORVASC) 10 MG tablet  Take 1 tablet (10 mg total) by mouth daily.  30 tablet  1   .  Ascorbic Acid (VITAMIN C) 500 MG tablet  Take 250 mg by mouth daily.     [name]  aspirin 81 MG tablet  Take 81 mg by mouth daily.     .  hydrochlorothiazide (MICROZIDE) 12.5 MG capsule  Take 1 capsule (12.5 mg total) by mouth daily.  90 capsule  3   .  rosuvastatin (CRESTOR) 10 MG tablet  Take 10 mg by mouth as directed. Monday, Wednesday, and Friday only      Results for orders placed during the hospital encounter of 11/13/13 (from the past 48 hour(s))   CBC Status: None    Collection Time    11/13/13 1:34 PM   Result  Value  Ref Range    WBC  4.9  4.0 - 10.5 K/uL    RBC  4.00  3.87 - 5.11 MIL/uL    Hemoglobin  12.3  12.0 - 15.0 g/dL    HCT  37.8  36.0 - 46.0 %    MCV  94.5  78.0 - 100.0 fL    MCH  30.8  26.0 - 34.0 pg    MCHC  32.5  30.0 - 36.0 g/dL    RDW  14.7  11.5 - 15.5 %    Platelets  276  150 - 400 K/uL   COMPREHENSIVE METABOLIC PANEL Status: Abnormal    Collection Time    11/13/13 1:34 PM   Result  Value  Ref Range    Sodium  143  137 - 147 mEq/L    Potassium  3.8  3.7 - 5.3 mEq/L    Chloride  104  96 - 112 mEq/L    CO2  23  19 - 32 mEq/L    Glucose, Bld  87  70 - 99 mg/dL    BUN  11  6 - 23 mg/dL    Creatinine, Ser  0.63  0.50 - 1.10 mg/dL    Calcium  8.9  8.4 - 10.5 mg/dL    Total Protein  7.1  6.0 - 8.3 g/dL    Albumin  3.9  3.5 - 5.2 g/dL    AST  19  0 - 37 U/L  ALT  10  0 - 35 U/L    Alkaline Phosphatase  51  39 - 117 U/L    Total Bilirubin  0.3  0.3 - 1.2 mg/dL    GFR calc non Af Amer  >90  >90 mL/min    GFR calc Af Amer  >90  >90 mL/min    Comment:  (NOTE)     The eGFR has been calculated using the CKD EPI equation.     This calculation has not been validated in all clinical situations.     eGFR's persistently <90 mL/min signify possible Chronic Kidney     Disease.    Anion gap  16 (*)  5 - 15   I-STAT  TROPOININ, ED Status: None    Collection Time    11/13/13 2:07 PM   Result  Value  Ref Range    Troponin i, poc  0.00  0.00 - 0.08 ng/mL    Comment 3      Comment:  Due to the release kinetics of cTnI,     a negative result within the first hours     of the onset of symptoms does not rule out     myocardial infarction with certainty.     If myocardial infarction is still suspected,     repeat the test at appropriate intervals.    Dg Chest 2 View  11/13/2013 CLINICAL DATA: Chest and right back pain since yesterday. History of hypertension. EXAM: CHEST 2 VIEW COMPARISON: 10/16/2012 and 06/17/2010. FINDINGS: The heart size and mediastinal contours are stable. There is stable mild interstitial prominence at both lung bases and stable right apical scarring. No confluent airspace opacity, pleural effusion or pneumothorax is seen. The osseous structures appear unchanged. IMPRESSION: Stable chest. No acute cardiopulmonary process. Electronically Signed By: Camie Patience M.D. On: 11/13/2013 14:19   ROS: General:no colds or fevers, no weight changes  Skin:no rashes or ulcers  HEENT:no blurred vision, no congestion CV:see HPI PUL:see HPI GI:no diarrhea constipation or melena, no indigestion GU:no hematuria, no dysuria MS:no joint pain, no claudication, + back pain through from rt chest  Neuro:no syncope, + lightheadedness began today  Endo:no diabetes, no thyroid disease  Blood pressure 126/50, pulse 44, temperature 97.5 F (36.4 C), temperature source Oral, resp. rate 16, height 5' 5"  (1.651 m), weight 133 lb (60.328 kg), SpO2 97.00%.  PE: General:Pleasant affect, obviously not feeling well  Skin:Warm and dry, brisk capillary refill  HEENT:normocephalic, sclera clear, mucus membranes moist  Neck:supple, no JVD, no bruits, no adenopathy, no thyromegaly  Heart:S1S2 RRR, slow With 1/6 systolic murmur, no gallup, rub or click  Lungs:clear without rales, rhonchi, or wheezes  KVQ:QVZD, non tender, +  BS, do not palpate liver spleen or masses  Ext:no lower ext edema, 2+ pedal pulses, 2+ radial pulses  BACK some mild pain to rt palpation, some increased pain with movement of rt. Arm.  Neuro:alert and oriented X 3, MAE, follows commands, + facial symmetry  Assessment/Plan  Principal Problem:  Back pain- unsure if back pain is an anginal equivalent vs MS some increased pain with movement - will admit to stepdown and follow enz  Active Problems:  Sinus bradycardia- usually in the 50s though at times in the 40s, now symptomatic, again is this due to pain causing brady, vs angina  Coronary artery disease, hx of stents to RCA- she does have residual disease 50% LCX OM and 20% LAD last year.  History of hypercholesterolemia  Left bundle-branch block  PVC's (premature ventricular contractions)  [id number],PJSRP R Nurse Practitioner Certified  [place]  Pager [phone] or after 5pm or weekends call [phone]  11/13/2013, 3:43 PM  I have seen and examined the patient along with [facility] R Nurse Practitioner Certified. I have reviewed the chart, notes and new data. I agree with NP's note.  Key new complaints: nausea and chest discomfort, especially right shoulder discomfort ALL improved after administration of IV atropine 0.5 mg and increase in HR to 60 bpm, NSR; residual LBBB  Key examination changes: frequent monomorphic PVCs do not generate peripheral pulse, further worsening her effective bradycardia  Key new findings / data: sinus bradycardia with old LBBB; cath reports 2012 and 2014 reviewed; echo 10/2012 normal LVEF  PLAN:  Will continue prn atropine IV, if necessary iv dopamine for tonight, but ultimately needs a permanent dual chamber pacemaker. Risks/benefits of permanent pacemaker and possible procedural complications were discussed with the patient and her spouse.  Symptoms improved rather than worsened with increase in her HR - not consistent with ischemia as cause  of her symptoms.  Sanda Klein, MD, [place]  [phone]  11/13/2013, 4:36 PM

## 2013-11-13 NOTE — ED Notes (Signed)
Pt reports feeling dizzy, pt BP 107/52, pt given emesis bag & cool wash cloth, holding next nitro SL tab at this time

## 2013-11-13 NOTE — ED Notes (Signed)
[place], MD at bedside. 

## 2013-11-13 NOTE — Consult Note (Signed)
Reason for consult:  Back pain Referring MD:  ER MD  AYDE RECORD is an 68 y.o. female.    Primary Cardiologist: Darlin Coco, MD  Chief Complaint: Back pain, now feels terrible  HPI:  68 year old female with of being admitted on 10/16/12 to [facility] with chest pain. She had cardiac catheterization on 10/17/12 showing significant 1 vessel coronary disease with patent stents in the RCA. No evidence of obstructive disease. Left ventricular function was normal. She has a history of known left bundle branch block. She has had known ischemic heart disease. She had her initial PCI on 05/14/10 at which time she was found to have a high-grade lesion in the midportion of the right coronary artery. She had a normal nuclear stress test 3 days prior to her successful PCI. She has 2 overlapping drug-eluting stents and was on Effient for a year and now is on 81 mg aspirin alone. The patient had started smoking again prior to her recent hospital stay. She is now off cigarettes entirely once again.   The patient had an echocardiogram on 10/18/12 which showed normal LV systolic function, aortic valve sclerosis without stenosis, and moderate TR. The patient also underwent carotid Dopplers on 10/18/12 which showed no obstruction.   She presents today with back pain- EKG with HR of 46. S BRADY and LBBB. On previous EKGs her HR has been in the  50s.  She is on no rate lowering medications. Now after morphine her HR is down to 30s and she is nauseated.    Her pain originated rt breast then through to rt back scapula area. Started on Monday, prior to that she had been feeling well.  The pain progressed and today she was lightheaded, weak, just feeling bad.  This is different from cardiac pain  With previous stents.   Troponin is negative.       Past Medical History  Diagnosis Date  . Coronary artery disease     a. s/p overlapping DES to RCA 04/2010 (days after neg stress test). b. Cath 06/2010 -  patent stents, nonobst dz otherwise;  c. 10/2012 Cath: LM nl, LAD 26m D1/2/3 nl, LCX 20p, OM2 50p, RCA 20% ISR mid, EF 55-60% ->Med Rx.  .Marland KitchenNosebleed   . Hypertension   . History of hypercholesterolemia     Statin intolerant  . Left bundle-branch block   . Tobacco abuse   . Sinus bradycardia     a. In past prevented BB use.  . Monoclonal gammopathy of undetermined significance     a. Previously seen by heme - this is per chart but pt does not recall.  .Marland KitchenPVC's (premature ventricular contractions)   . Carotid artery disease     a. Hx r carotid bruit - dopplers 2009 <50% bilat;  b. 10/2012 U/S: no hemodynamically signif stenosis.    Past Surgical History  Procedure Laterality Date  . Pci      HIGH GRADE LESION IN THE MIDPORTION OF THE RIGHT CORONARY ARTERY WHICH WAS A LARGE ARTERY  . Cardiac catheterization  05/14/10    SHOWED EVIDENCE OF REVERSIBLE VASOSPASM    Family History  Problem Relation Age of Onset  . Cancer Father   . Heart disease Father    Social History:  reports that she has quit smoking. She does not have any smokeless tobacco history on file. She reports that she does not drink alcohol or use illicit drugs.  Allergies:  Allergies  Allergen Reactions  . Ezetimibe Other (See Comments)    unknown  . Isosorbide Swelling    SWELLING OF THE TONGUE - pt states she received this after PNA ? But has been able to tolerate SL nitroglycerin without adverse reaction.  . Metoprolol Succinate [Metoprolol]     Dizziness, diarrhea, tingling in fingers  . Plavix [Clopidogrel Bisulfate]     Prior P2Y12 showing 267 (poor Plavix responsiveness)  . Sulfa Antibiotics     Sulfa drugs as a child  . Sulfonamide Derivatives     Sulfa drugs as a child  . Zocor [Simvastatin]     Leg pain - does not tolerate high doses of statins    No current facility-administered medications on file prior to encounter.   Current Outpatient Prescriptions on File Prior to Encounter  Medication Sig  Dispense Refill  . amLODipine (NORVASC) 10 MG tablet Take 1 tablet (10 mg total) by mouth daily.  30 tablet  1  . Ascorbic Acid (VITAMIN C) 500 MG tablet Take 250 mg by mouth daily.       [name] aspirin 81 MG tablet Take 81 mg by mouth daily.       . hydrochlorothiazide (MICROZIDE) 12.5 MG capsule Take 1 capsule (12.5 mg total) by mouth daily.  90 capsule  3  . rosuvastatin (CRESTOR) 10 MG tablet Take 10 mg by mouth as directed. Monday, Wednesday, and Friday only        Results for orders placed during the hospital encounter of 11/13/13 (from the past 48 hour(s))  CBC     Status: None   Collection Time    11/13/13  1:34 PM      Result Value Ref Range   WBC 4.9  4.0 - 10.5 K/uL   RBC 4.00  3.87 - 5.11 MIL/uL   Hemoglobin 12.3  12.0 - 15.0 g/dL   HCT 37.8  36.0 - 46.0 %   MCV 94.5  78.0 - 100.0 fL   MCH 30.8  26.0 - 34.0 pg   MCHC 32.5  30.0 - 36.0 g/dL   RDW 14.7  11.5 - 15.5 %   Platelets 276  150 - 400 K/uL  COMPREHENSIVE METABOLIC PANEL     Status: Abnormal   Collection Time    11/13/13  1:34 PM      Result Value Ref Range   Sodium 143  137 - 147 mEq/L   Potassium 3.8  3.7 - 5.3 mEq/L   Chloride 104  96 - 112 mEq/L   CO2 23  19 - 32 mEq/L   Glucose, Bld 87  70 - 99 mg/dL   BUN 11  6 - 23 mg/dL   Creatinine, Ser 0.63  0.50 - 1.10 mg/dL   Calcium 8.9  8.4 - 10.5 mg/dL   Total Protein 7.1  6.0 - 8.3 g/dL   Albumin 3.9  3.5 - 5.2 g/dL   AST 19  0 - 37 U/L   ALT 10  0 - 35 U/L   Alkaline Phosphatase 51  39 - 117 U/L   Total Bilirubin 0.3  0.3 - 1.2 mg/dL   GFR calc non Af Amer >90  >90 mL/min   GFR calc Af Amer >90  >90 mL/min   Comment: (NOTE)     The eGFR has been calculated using the CKD EPI equation.     This calculation has not been validated in all clinical situations.     eGFR's persistently <90 mL/min signify possible  Chronic Kidney     Disease.   Anion gap 16 (*) 5 - 15  I-STAT TROPOININ, ED     Status: None   Collection Time    11/13/13  2:07 PM      Result Value  Ref Range   Troponin i, poc 0.00  0.00 - 0.08 ng/mL   Comment 3            Comment: Due to the release kinetics of cTnI,     a negative result within the first hours     of the onset of symptoms does not rule out     myocardial infarction with certainty.     If myocardial infarction is still suspected,     repeat the test at appropriate intervals.   Dg Chest 2 View  11/13/2013   CLINICAL DATA:  Chest and right back pain since yesterday. History of hypertension.  EXAM: CHEST  2 VIEW  COMPARISON:  10/16/2012 and 06/17/2010.  FINDINGS: The heart size and mediastinal contours are stable. There is stable mild interstitial prominence at both lung bases and stable right apical scarring. No confluent airspace opacity, pleural effusion or pneumothorax is seen. The osseous structures appear unchanged.  IMPRESSION: Stable chest.  No acute cardiopulmonary process.   Electronically Signed   By: Camie Patience M.D.   On: 11/13/2013 14:19    ROS: General:no colds or fevers, no weight changes Skin:no rashes or ulcers HEENT:no blurred vision, no congestion CV:see HPI PUL:see HPI GI:no diarrhea constipation or melena, no indigestion GU:no hematuria, no dysuria MS:no joint pain, no claudication, + back pain through from rt chest  Neuro:no syncope, + lightheadedness began today Endo:no diabetes, no thyroid disease   Blood pressure 126/50, pulse 44, temperature 97.5 F (36.4 C), temperature source Oral, resp. rate 16, height 5' 5"  (1.651 m), weight 133 lb (60.328 kg), SpO2 97.00%. PE: General:Pleasant affect, obviously not feeling well  Skin:Warm and dry, brisk capillary refill HEENT:normocephalic, sclera clear, mucus membranes moist Neck:supple, no JVD, no bruits, no adenopathy, no thyromegaly   Heart:S1S2 RRR, slow  With 1/6 systolic murmur, no gallup, rub or click Lungs:clear without rales, rhonchi, or wheezes IRJ:JOAC, non tender, + BS, do not palpate liver spleen or masses Ext:no lower ext edema, 2+  pedal pulses, 2+ radial pulses BACK some mild pain to rt palpation, some increased pain with movement of rt. Arm. Neuro:alert and oriented X 3, MAE, follows commands, + facial symmetry    Assessment/Plan Principal Problem:   Back pain- unsure if back pain is an anginal equivalent vs MS some increased pain with movement - will admit to stepdown and follow enz Active Problems:   Sinus bradycardia- usually in the 50s though at times in the 40s, now symptomatic, again is this due to pain causing brady, vs angina   Coronary artery disease, hx of stents to RCA- she does have residual disease 50% LCX OM and 20% LAD last year.   History of hypercholesterolemia   Left bundle-branch block   PVC's (premature ventricular contractions)    ZYSAYT,KZSWF R Nurse Practitioner Certified [place] Pager [phone] or after 5pm or weekends call [phone] 11/13/2013, 3:43 PM   I have seen and examined the patient along with [facility] R Nurse Practitioner Certified.  I have reviewed the chart, notes and new data.  I agree with NP's note.  Key new complaints: nausea and chest discomfort, especially right shoulder discomfort ALL improved after administration of IV atropine 0.5 mg and  increase in HR to 60 bpm, NSR; residual LBBB Key examination changes: frequent monomorphic PVCs do not generate peripheral pulse, further worsening her effective bradycardia Key new findings / data: sinus bradycardia with old LBBB; cath reports 2012 and 2014 reviewed; echo 10/2012 normal LVEF  PLAN: Will continue prn atropine IV, if necessary iv dopamine for tonight, but ultimately needs a permanent dual chamber pacemaker. Risks/benefits of permanent pacemaker and possible procedural complications were discussed with the patient and her spouse. Symptoms improved rather than worsened with increase in her HR - not consistent with ischemia as cause of her symptoms.  Sanda Klein, MD, [place] [phone] 11/13/2013, 4:36 PM

## 2013-11-14 ENCOUNTER — Encounter (HOSPITAL_COMMUNITY)
Admission: EM | Disposition: A | Discharge status: Home / Self Care | Payer: Self-pay | Source: Home / Self Care | Attending: Cardiovascular Disease

## 2013-11-14 DIAGNOSIS — I517 Cardiomegaly: Secondary | ICD-10-CM

## 2013-11-14 DIAGNOSIS — I495 Sick sinus syndrome: Principal | ICD-10-CM

## 2013-11-14 HISTORY — PX: PERMANENT PACEMAKER INSERTION: SHX5480

## 2013-11-14 HISTORY — PX: PACEMAKER INSERTION: SHX728

## 2013-11-14 LAB — BASIC METABOLIC PANEL
ANION GAP: 15 (ref 5–15)
BUN: 11 mg/dL (ref 6–23)
CO2: 21 meq/L (ref 19–32)
Calcium: 8.7 mg/dL (ref 8.4–10.5)
Chloride: 105 mEq/L (ref 96–112)
Creatinine, Ser: 0.57 mg/dL (ref 0.50–1.10)
GFR calc Af Amer: >90 mL/min (ref >90)
GLUCOSE: 81 mg/dL (ref 70–99)
POTASSIUM: 4.2 meq/L (ref 3.7–5.3)
SODIUM: 141 meq/L (ref 137–147)

## 2013-11-14 LAB — CBC
HCT: 36.4 % (ref 36.0–46.0)
Hemoglobin: 11.7 g/dL — ABNORMAL LOW (ref 12.0–15.0)
MCH: 30 pg (ref 26.0–34.0)
MCHC: 32.1 g/dL (ref 30.0–36.0)
MCV: 93.3 fL (ref 78.0–100.0)
PLATELETS: 293 10*3/uL (ref 150–400)
RBC: 3.9 MIL/uL (ref 3.87–5.11)
RDW: 15 % (ref 11.5–15.5)
WBC: 7 10*3/uL (ref 4.0–10.5)

## 2013-11-14 LAB — HEMOGLOBIN A1C
Hgb A1c MFr Bld: 5.7 % — ABNORMAL HIGH (ref <5.7)
Mean Plasma Glucose: 117 mg/dL — ABNORMAL HIGH (ref <117)

## 2013-11-14 LAB — T4, FREE: FREE T4: 1.06 ng/dL (ref 0.80–1.80)

## 2013-11-14 LAB — LIPID PANEL
Cholesterol: 210 mg/dL — ABNORMAL HIGH (ref 0–200)
HDL: 53 mg/dL (ref >39)
LDL CALC: 134 mg/dL — AB (ref 0–99)
Total CHOL/HDL Ratio: 4 RATIO
Triglycerides: 117 mg/dL (ref <150)
VLDL: 23 mg/dL (ref 0–40)

## 2013-11-14 LAB — TROPONIN I
Troponin I: <0.3 ng/mL (ref <0.30)
Troponin I: <0.3 ng/mL (ref <0.30)

## 2013-11-14 SURGERY — PERMANENT PACEMAKER INSERTION
Anesthesia: LOCAL

## 2013-11-14 MED ORDER — LIDOCAINE HCL (PF) 1 % IJ SOLN
INTRAMUSCULAR | Status: AC
  Filled 2013-11-14: qty 60

## 2013-11-14 MED ORDER — SODIUM CHLORIDE 0.9 % IJ SOLN: 3.0000 mL | INTRAMUSCULAR | Status: DC | PRN

## 2013-11-14 MED ORDER — ACETAMINOPHEN 325 MG PO TABS: 325.0000 mg | ORAL_TABLET | ORAL | Status: DC | PRN

## 2013-11-14 MED ORDER — ONDANSETRON HCL 4 MG/2ML IJ SOLN
4.0000 mg | Freq: Four times a day (QID) | INTRAMUSCULAR | Status: DC | PRN
Start: 2013-11-14 — End: 2013-11-15
  Administered 2013-11-14: 4 mg via INTRAVENOUS
  Filled 2013-11-14: qty 2

## 2013-11-14 MED ORDER — FENTANYL CITRATE 0.05 MG/ML IJ SOLN
INTRAMUSCULAR | Status: AC
  Filled 2013-11-14: qty 2

## 2013-11-14 MED ORDER — HEPARIN (PORCINE) IN NACL 2-0.9 UNIT/ML-% IJ SOLN
INTRAMUSCULAR | Status: AC
  Filled 2013-11-14: qty 500

## 2013-11-14 MED ORDER — HYDROCODONE-ACETAMINOPHEN 5-325 MG PO TABS: 1.0000 | ORAL_TABLET | ORAL | Status: DC | PRN

## 2013-11-14 MED ORDER — MIDAZOLAM HCL 5 MG/5ML IJ SOLN
INTRAMUSCULAR | Status: AC
Start: 2013-11-14 — End: 2013-11-14
  Filled 2013-11-14: qty 5

## 2013-11-14 MED ORDER — SODIUM CHLORIDE 0.9 % IV SOLN: 250.0000 mL | INTRAVENOUS | Status: DC | PRN

## 2013-11-14 MED ORDER — CEFAZOLIN SODIUM 1-5 GM-% IV SOLN
1.0000 g | Freq: Four times a day (QID) | INTRAVENOUS | Status: DC
  Administered 2013-11-14 – 2013-11-15 (×2): 1 g via INTRAVENOUS
  Filled 2013-11-14 (×4): qty 50

## 2013-11-14 MED ORDER — SODIUM CHLORIDE 0.9 % IJ SOLN
3.0000 mL | Freq: Two times a day (BID) | INTRAMUSCULAR | Status: DC
  Administered 2013-11-14: 3 mL via INTRAVENOUS

## 2013-11-14 NOTE — Progress Notes (Signed)
Utilization review completed. Akshar Starnes, RN, BSN. 

## 2013-11-14 NOTE — Consult Note (Signed)
ELECTROPHYSIOLOGY CONSULT NOTE    Patient ID: Katherine [name] MRN: [id number], DOB/AGE: 1945-10-07 68 y.o.  Admit date: 11/13/2013 Date of Consult: 11-14-13  Primary Cardiologist: Mare Ferrari  Reason for Consultation: symptomatic bradycardia  HPI:  Katherine [name] is a 68 y.o. female with a past medical history notable for CAD s/p intervention to RCA, hypertension, carotid artery disease.  She has had progressive fatigue and exercise intolerance. She was admitted on 11-13-13 with abdominal and back pain which has resolved.  She denies CP and says that this pain was not like her previous anginal pain.  She was found to be bradycardic with rates in the 30's to 40's.  EP has been asked to evaluate for treatment options.   She has not been on any AV nodal blocking agents.  Her thyroid is normal.  Last echo 10-2012 demonstrated normal EF with no significant valvular abnormalities.  Repeat echo pending this admission.  She denies chest pain, shortness of breath, palpitations, dizziness, or frank syncope. She has not had recent nausea, vomiting, fevers, or chills.  ROS is otherwise negative.   Past Medical History  Diagnosis Date  . Coronary artery disease     a. s/p overlapping DES to RCA 04/2010 (days after neg stress test). b. Cath 06/2010 - patent stents, nonobst dz otherwise;  c. 10/2012 Cath: LM nl, LAD 55m, D1/2/3 nl, LCX 20p, OM2 50p, RCA 20% ISR mid, EF 55-60% ->Med Rx.  [name] Nosebleed   . Hypertension   . History of hypercholesterolemia     Statin intolerant  . Left bundle-branch block   . Tobacco abuse   . Sinus bradycardia     a. In past prevented BB use.  . Monoclonal gammopathy of undetermined significance     a. Previously seen by heme - this is per chart but pt does not recall.  [name] PVC's (premature ventricular contractions)   . Carotid artery disease     a. Hx r carotid bruit - dopplers 2009 <50% bilat;  b. 10/2012 U/S: no hemodynamically signif stenosis.     Surgical History:  Past  Surgical History  Procedure Laterality Date  . Pci      HIGH GRADE LESION IN THE MIDPORTION OF THE RIGHT CORONARY ARTERY WHICH WAS A LARGE ARTERY  . Cardiac catheterization  05/14/10    SHOWED EVIDENCE OF REVERSIBLE VASOSPASM     Prescriptions prior to admission  Medication Sig Dispense Refill  . amLODipine (NORVASC) 10 MG tablet Take 1 tablet (10 mg total) by mouth daily.  30 tablet  1  . Ascorbic Acid (VITAMIN C) 500 MG tablet Take 250 mg by mouth daily.       [name] aspirin 81 MG tablet Take 81 mg by mouth daily.       . Cholecalciferol (VITAMIN D PO) Take 1 tablet by mouth daily.      . hydrochlorothiazide (MICROZIDE) 12.5 MG capsule Take 1 capsule (12.5 mg total) by mouth daily.  90 capsule  3  . nitroGLYCERIN (NITROSTAT) 0.4 MG SL tablet Place 0.4 mg under the tongue every 5 (five) minutes as needed for chest pain.      . rosuvastatin (CRESTOR) 10 MG tablet Take 10 mg by mouth as directed. Monday, Wednesday, and Friday only        Inpatient Medications:  . aspirin  324 mg Oral NOW   Or  . aspirin  300 mg Rectal NOW  . aspirin EC  81 mg Oral Daily  . atorvastatin  20 mg Oral q1800  .  ceFAZolin (ANCEF) IV  2 g Intravenous On Call  . cholecalciferol  400 Units Oral Daily  . gentamicin irrigation  80 mg Irrigation On Call  . heparin subcutaneous  5,000 Units Subcutaneous 3 times per day    Allergies:  Allergies  Allergen Reactions  . Ezetimibe Other (See Comments)    unknown  . Isosorbide Swelling    SWELLING OF THE TONGUE - pt states she received this after PNA ? But has been able to tolerate SL nitroglycerin without adverse reaction.  . Metoprolol Succinate [Metoprolol]     Dizziness, diarrhea, tingling in fingers  . Plavix [Clopidogrel Bisulfate]     Prior P2Y12 showing 267 (poor Plavix responsiveness)  . Sulfa Antibiotics     Sulfa drugs as a child  . Sulfonamide Derivatives     Sulfa drugs as a child  . Zocor [Simvastatin]     Leg pain - does not tolerate high doses  of statins    History   Social History  . Marital Status: Married    Spouse Name: N/A    Number of Children: N/A  . Years of Education: N/A   Occupational History  . Not on file.   Social History Main Topics  . Smoking status: Former Research scientist (life sciences)  . Smokeless tobacco: Not on file     Comment: Smoking for over 45 years  . Alcohol Use: No  . Drug Use: No  . Sexual Activity: Not on file   Other Topics Concern  . Not on file   Social History Narrative  . No narrative on file     Family History  Problem Relation Age of Onset  . Cancer Father   . Heart disease Father     BP 129/74  Pulse 41  Temp(Src) 98 F (36.7 C) (Oral)  Resp 16  Ht 5\' 5"  (1.651 m)  Wt 130 lb 15.3 oz (59.4 kg)  BMI 21.79 kg/m2  SpO2 96%  Physical Exam: Physical Exam: Filed Vitals:   11/14/13 0700 11/14/13 0740 11/14/13 0900 11/14/13 1000  BP:  129/74 181/112 155/80  Pulse: 41     Temp:  98 F (36.7 C)    TempSrc:  Oral    Resp: 15 16 17 20   Height:      Weight:      SpO2: 97% 96%      GEN- The patient is well appearing, alert and oriented x 3 today.   Head- normocephalic, atraumatic Eyes-  Sclera clear, conjunctiva pink Ears- hearing intact Oropharynx- clear Neck- supple, Lungs- Clear to ausculation bilaterally, normal work of breathing Heart- bradycardic regular rhythm  GI- soft, NT, ND, + BS Extremities- no clubbing, cyanosis, or edema, groin is without hematoma/ bruit MS- no significant deformity or atrophy Skin- no rash or lesion Psych- euthymic mood, full affect Neuro- strength and sensation are intact    Labs:   Lab Results  Component Value Date   WBC 7.0 11/14/2013   HGB 11.7* 11/14/2013   HCT 36.4 11/14/2013   MCV 93.3 11/14/2013   PLT 293 11/14/2013    Recent Labs Lab 11/13/13 1334 11/14/13 0548  NA 143 141  K 3.8 4.2  CL 104 105  CO2 23 21  BUN 11 11  CREATININE 0.63 0.57  CALCIUM 8.9 8.7  PROT 7.1  --   BILITOT 0.3  --   ALKPHOS 51  --   ALT 10  --     AST 19  --  GLUCOSE 87 81    Radiology/Studies: Dg Chest 2 View 11/13/2013   CLINICAL DATA:  Chest and right back pain since yesterday. History of hypertension.  EXAM: CHEST  2 VIEW  COMPARISON:  10/16/2012 and 06/17/2010.  FINDINGS: The heart size and mediastinal contours are stable. There is stable mild interstitial prominence at both lung bases and stable right apical scarring. No confluent airspace opacity, pleural effusion or pneumothorax is seen. The osseous structures appear unchanged.  IMPRESSION: Stable chest.  No acute cardiopulmonary process.   Electronically Signed   By: Camie Patience M.D.   On: 11/13/2013 14:19    GHW:EXHBZ brady, rate 44, LBBB, QRS 146  TELEMETRY: sinus bradycardia, PVC's  A/P  1. Sick sinus syndrome The patient has symptomatic sinus bradycardia bradycardia.  I would therefore recommend pacemaker implantation at this time.  Risks, benefits, alternatives to pacemaker implantation were discussed in detail with the patient today. The patient understands that the risks include but are not limited to bleeding, infection, pneumothorax, perforation, tamponade, vascular damage, renal failure, MI, stroke, death,  and lead dislodgement and wishes to proceed. We will therefore schedule the procedure at the next available time.  2. CAD S/p prior PCI If her fatigue and atypical symptoms do not resolve with PPM implant, she may warrant ischemic workup at that time.  I will defer to Dr Mare Ferrari.  Per Dr Sallyanne Kuster, ischemic workup not felt to be indicated presently.

## 2013-11-14 NOTE — H&P (View-Only) (Signed)
ELECTROPHYSIOLOGY CONSULT NOTE    Patient ID: Katherine [name] MRN: [id number], DOB/AGE: 27-Mar-1946 68 y.o.  Admit date: 11/13/2013 Date of Consult: 11-14-13  Primary Cardiologist: Mare Ferrari  Reason for Consultation: symptomatic bradycardia  HPI:  Katherine [name] is a 68 y.o. female with a past medical history notable for CAD s/p intervention to RCA, hypertension, carotid artery disease.  She has had progressive fatigue and exercise intolerance. She was admitted on 11-13-13 with abdominal and back pain which has resolved.  She denies CP and says that this pain was not like her previous anginal pain.  She was found to be bradycardic with rates in the 30's to 40's.  EP has been asked to evaluate for treatment options.   She has not been on any AV nodal blocking agents.  Her thyroid is normal.  Last echo 10-2012 demonstrated normal EF with no significant valvular abnormalities.  Repeat echo pending this admission.  She denies chest pain, shortness of breath, palpitations, dizziness, or frank syncope. She has not had recent nausea, vomiting, fevers, or chills.  ROS is otherwise negative.   Past Medical History  Diagnosis Date  . Coronary artery disease     a. s/p overlapping DES to RCA 04/2010 (days after neg stress test). b. Cath 06/2010 - patent stents, nonobst dz otherwise;  c. 10/2012 Cath: LM nl, LAD 93m, D1/2/3 nl, LCX 20p, OM2 50p, RCA 20% ISR mid, EF 55-60% ->Med Rx.  [name] Nosebleed   . Hypertension   . History of hypercholesterolemia     Statin intolerant  . Left bundle-branch block   . Tobacco abuse   . Sinus bradycardia     a. In past prevented BB use.  . Monoclonal gammopathy of undetermined significance     a. Previously seen by heme - this is per chart but pt does not recall.  [name] PVC's (premature ventricular contractions)   . Carotid artery disease     a. Hx r carotid bruit - dopplers 2009 <50% bilat;  b. 10/2012 U/S: no hemodynamically signif stenosis.     Surgical History:  Past  Surgical History  Procedure Laterality Date  . Pci      HIGH GRADE LESION IN THE MIDPORTION OF THE RIGHT CORONARY ARTERY WHICH WAS A LARGE ARTERY  . Cardiac catheterization  05/14/10    SHOWED EVIDENCE OF REVERSIBLE VASOSPASM     Prescriptions prior to admission  Medication Sig Dispense Refill  . amLODipine (NORVASC) 10 MG tablet Take 1 tablet (10 mg total) by mouth daily.  30 tablet  1  . Ascorbic Acid (VITAMIN C) 500 MG tablet Take 250 mg by mouth daily.       [name] aspirin 81 MG tablet Take 81 mg by mouth daily.       . Cholecalciferol (VITAMIN D PO) Take 1 tablet by mouth daily.      . hydrochlorothiazide (MICROZIDE) 12.5 MG capsule Take 1 capsule (12.5 mg total) by mouth daily.  90 capsule  3  . nitroGLYCERIN (NITROSTAT) 0.4 MG SL tablet Place 0.4 mg under the tongue every 5 (five) minutes as needed for chest pain.      . rosuvastatin (CRESTOR) 10 MG tablet Take 10 mg by mouth as directed. Monday, Wednesday, and Friday only        Inpatient Medications:  . aspirin  324 mg Oral NOW   Or  . aspirin  300 mg Rectal NOW  . aspirin EC  81 mg Oral Daily  . atorvastatin  20 mg Oral q1800  .  ceFAZolin (ANCEF) IV  2 g Intravenous On Call  . cholecalciferol  400 Units Oral Daily  . gentamicin irrigation  80 mg Irrigation On Call  . heparin subcutaneous  5,000 Units Subcutaneous 3 times per day    Allergies:  Allergies  Allergen Reactions  . Ezetimibe Other (See Comments)    unknown  . Isosorbide Swelling    SWELLING OF THE TONGUE - pt states she received this after PNA ? But has been able to tolerate SL nitroglycerin without adverse reaction.  . Metoprolol Succinate [Metoprolol]     Dizziness, diarrhea, tingling in fingers  . Plavix [Clopidogrel Bisulfate]     Prior P2Y12 showing 267 (poor Plavix responsiveness)  . Sulfa Antibiotics     Sulfa drugs as a child  . Sulfonamide Derivatives     Sulfa drugs as a child  . Zocor [Simvastatin]     Leg pain - does not tolerate high doses  of statins    History   Social History  . Marital Status: Married    Spouse Name: N/A    Number of Children: N/A  . Years of Education: N/A   Occupational History  . Not on file.   Social History Main Topics  . Smoking status: Former Research scientist (life sciences)  . Smokeless tobacco: Not on file     Comment: Smoking for over 45 years  . Alcohol Use: No  . Drug Use: No  . Sexual Activity: Not on file   Other Topics Concern  . Not on file   Social History Narrative  . No narrative on file     Family History  Problem Relation Age of Onset  . Cancer Father   . Heart disease Father     BP 129/74  Pulse 41  Temp(Src) 98 F (36.7 C) (Oral)  Resp 16  Ht 5\' 5"  (1.651 m)  Wt 130 lb 15.3 oz (59.4 kg)  BMI 21.79 kg/m2  SpO2 96%  Physical Exam: Physical Exam: Filed Vitals:   11/14/13 0700 11/14/13 0740 11/14/13 0900 11/14/13 1000  BP:  129/74 181/112 155/80  Pulse: 41     Temp:  98 F (36.7 C)    TempSrc:  Oral    Resp: 15 16 17 20   Height:      Weight:      SpO2: 97% 96%      GEN- The patient is well appearing, alert and oriented x 3 today.   Head- normocephalic, atraumatic Eyes-  Sclera clear, conjunctiva pink Ears- hearing intact Oropharynx- clear Neck- supple, Lungs- Clear to ausculation bilaterally, normal work of breathing Heart- bradycardic regular rhythm  GI- soft, NT, ND, + BS Extremities- no clubbing, cyanosis, or edema, groin is without hematoma/ bruit MS- no significant deformity or atrophy Skin- no rash or lesion Psych- euthymic mood, full affect Neuro- strength and sensation are intact    Labs:   Lab Results  Component Value Date   WBC 7.0 11/14/2013   HGB 11.7* 11/14/2013   HCT 36.4 11/14/2013   MCV 93.3 11/14/2013   PLT 293 11/14/2013    Recent Labs Lab 11/13/13 1334 11/14/13 0548  NA 143 141  K 3.8 4.2  CL 104 105  CO2 23 21  BUN 11 11  CREATININE 0.63 0.57  CALCIUM 8.9 8.7  PROT 7.1  --   BILITOT 0.3  --   ALKPHOS 51  --   ALT 10  --     AST 19  --  GLUCOSE 87 81    Radiology/Studies: Dg Chest 2 View 11/13/2013   CLINICAL DATA:  Chest and right back pain since yesterday. History of hypertension.  EXAM: CHEST  2 VIEW  COMPARISON:  10/16/2012 and 06/17/2010.  FINDINGS: The heart size and mediastinal contours are stable. There is stable mild interstitial prominence at both lung bases and stable right apical scarring. No confluent airspace opacity, pleural effusion or pneumothorax is seen. The osseous structures appear unchanged.  IMPRESSION: Stable chest.  No acute cardiopulmonary process.   Electronically Signed   By: Camie Patience M.D.   On: 11/13/2013 14:19    HFW:YOVZC brady, rate 44, LBBB, QRS 146  TELEMETRY: sinus bradycardia, PVC's  A/P  1. Sick sinus syndrome The patient has symptomatic sinus bradycardia bradycardia.  I would therefore recommend pacemaker implantation at this time.  Risks, benefits, alternatives to pacemaker implantation were discussed in detail with the patient today. The patient understands that the risks include but are not limited to bleeding, infection, pneumothorax, perforation, tamponade, vascular damage, renal failure, MI, stroke, death,  and lead dislodgement and wishes to proceed. We will therefore schedule the procedure at the next available time.  2. CAD S/p prior PCI If her fatigue and atypical symptoms do not resolve with PPM implant, she may warrant ischemic workup at that time.  I will defer to Dr Mare Ferrari.  Per Dr Sallyanne Kuster, ischemic workup not felt to be indicated presently.

## 2013-11-14 NOTE — Op Note (Signed)
SURGEON:  Thompson Grayer, MD     PREPROCEDURE DIAGNOSIS:  Sick sinus syndrome, left bundle branch block    POSTPROCEDURE DIAGNOSIS:  Sick sinus syndrome, transient complete heart block     PROCEDURES:   1. Left upper extremity venography.   2. Pacemaker implantation.     INTRODUCTION: Katherine [name] is a 68 y.o. female  with a history of sick sinus syndrome who presents today for pacemaker implantation.  The patient reports intermittent episodes of fatigue chronically.  She has symptomatic sinus bradycardia.   She also has a LBBB with a preserved EF.  No reversible causes have been identified.  The patient therefore presents today for pacemaker implantation.     DESCRIPTION OF PROCEDURE:  Informed written consent was obtained, and the patient was brought to the electrophysiology lab in a fasting state.  The patient required no sedation for the procedure today.  The patients left chest was prepped and draped in the usual sterile fashion by the EP lab staff. The skin overlying the left deltopectoral region was infiltrated with lidocaine for local analgesia.  A 4-cm incision was made over the left deltopectoral region.  A left subcutaneous pacemaker pocket was fashioned using a combination of sharp and blunt dissection. Electrocautery was required to assure hemostasis.    Left Upper Extremity Venography: A venogram of the left upper extremity was performed, which revealed a large left axillary vein, which emptied into a large left subclavian vein.     RA/RV Lead Placement: The left axillary vein was therefore directly visualized and cannulated.  Through the left axillary vein, a Medtronic model [id number] (serial number [id number]) right atrial lead and a Medtronic model 5076- 58 (serial number [id number]) right ventricular lead were advanced with fluoroscopic visualization into the right atrial appendage and right ventricular apex positions respectively.  With lead manipulation the patient developed  complete heart block which persisted throughout the remainder of the case. Initial atrial lead P- waves measured 1.8 mV with impedance of 811 ohms and a threshold of 1.3 V at 0.5 msec.  Right ventricular lead R-waves measured 8 mV with an impedance of 990 ohms and a threshold of 1.2 V at 0.5 msec.  Both leads were secured to the pectoralis fascia using #2-0 silk over the suture sleeves.   Device Placement:  The leads were then connected to a Medtronic Adapta L model ADDRL 1 (serial number [id number]) pacemaker.  The pocket was irrigated with copious gentamicin solution.  The pacemaker was then placed into the pocket.  The pocket was then closed in 2 layers with 2.0 Vicryl suture for the subcutaneous and subcuticular layers.  Steri- Strips and a sterile dressing were then applied.  There were no early apparent complications.     CONCLUSIONS:   1. Successful implantation of a Medtronic Adapta L dual-chamber pacemaker for sick sinus syndrome/ sinus bradycardia  2. No early apparent complications.           Thompson Grayer, MD 11/14/2013 4:33 PM

## 2013-11-14 NOTE — Progress Notes (Signed)
Echocardiogram 2D Echocardiogram has been performed.  Joelene Millin 11/14/2013, 8:18 AM

## 2013-11-14 NOTE — Interval H&P Note (Signed)
History and Physical Interval Note:  11/14/2013 2:39 PM  Katherine [name]  has presented today for surgery, with the diagnosis of bradycardia  The various methods of treatment have been discussed with the patient and family. After consideration of risks, benefits and other options for treatment, the patient has consented to  Procedure(s): PERMANENT PACEMAKER INSERTION (N/A) as a surgical intervention .  The patient's history has been reviewed, patient examined, no change in status, stable for surgery.  I have reviewed the patient's chart and labs.  Questions were answered to the patient's satisfaction.     Thompson Grayer

## 2013-11-15 ENCOUNTER — Inpatient Hospital Stay (HOSPITAL_COMMUNITY): Payer: PRIVATE HEALTH INSURANCE

## 2013-11-15 ENCOUNTER — Telehealth: Payer: Self-pay | Admitting: Physician Assistant

## 2013-11-15 ENCOUNTER — Encounter (HOSPITAL_COMMUNITY): Payer: Self-pay | Admitting: Physician Assistant

## 2013-11-15 DIAGNOSIS — D472 Monoclonal gammopathy: Secondary | ICD-10-CM | POA: Diagnosis present

## 2013-11-15 DIAGNOSIS — I739 Peripheral vascular disease, unspecified: Secondary | ICD-10-CM

## 2013-11-15 DIAGNOSIS — E785 Hyperlipidemia, unspecified: Secondary | ICD-10-CM | POA: Diagnosis present

## 2013-11-15 DIAGNOSIS — E876 Hypokalemia: Secondary | ICD-10-CM

## 2013-11-15 DIAGNOSIS — I495 Sick sinus syndrome: Secondary | ICD-10-CM | POA: Diagnosis present

## 2013-11-15 DIAGNOSIS — Z9289 Personal history of other medical treatment: Secondary | ICD-10-CM

## 2013-11-15 DIAGNOSIS — I779 Disorder of arteries and arterioles, unspecified: Secondary | ICD-10-CM | POA: Diagnosis present

## 2013-11-15 DIAGNOSIS — I119 Hypertensive heart disease without heart failure: Secondary | ICD-10-CM | POA: Diagnosis present

## 2013-11-15 LAB — BASIC METABOLIC PANEL
ANION GAP: 14 (ref 5–15)
BUN: 8 mg/dL (ref 6–23)
CHLORIDE: 108 meq/L (ref 96–112)
CO2: 20 meq/L (ref 19–32)
CREATININE: 0.52 mg/dL (ref 0.50–1.10)
Calcium: 7.3 mg/dL — ABNORMAL LOW (ref 8.4–10.5)
GFR calc non Af Amer: >90 mL/min (ref >90)
Glucose, Bld: 176 mg/dL — ABNORMAL HIGH (ref 70–99)
POTASSIUM: 3.1 meq/L — AB (ref 3.7–5.3)
Sodium: 142 mEq/L (ref 137–147)

## 2013-11-15 MED ORDER — CYCLOBENZAPRINE HCL 5 MG PO TABS: 5.0000 mg | ORAL_TABLET | Freq: Three times a day (TID) | ORAL | Status: DC | PRN

## 2013-11-15 MED ORDER — POTASSIUM CHLORIDE 20 MEQ/15ML (10%) PO LIQD
40.0000 meq | Freq: Once | ORAL | Status: AC
  Administered 2013-11-15: 40 meq via ORAL
  Filled 2013-11-15: qty 30

## 2013-11-15 MED ORDER — POTASSIUM CHLORIDE CRYS ER 20 MEQ PO TBCR: 40.0000 meq | EXTENDED_RELEASE_TABLET | Freq: Once | ORAL | Status: DC

## 2013-11-15 NOTE — Discharge Summary (Signed)
Discharge Summary   Patient ID: Katherine [name] MRN: [id number], DOB/AGE: 1945-09-16 68 y.o. Admit date: 11/13/2013 D/C date:     11/15/2013  Primary Cardiologist: Dr. Mare Ferrari   Principal Problem:   Sick sinus syndrome Active Problems:   Coronary artery disease, hx of stents to RCA   Left bundle-branch block   PVC's (premature ventricular contractions)   Sinus bradycardia   Back pain   Symptomatic sinus bradycardia   History of echocardiogram   Carotid artery disease   Monoclonal gammopathy of undetermined significance   HLD (hyperlipidemia)   Hypertension   Admission Dates: 11/13/13-11/15/13 Discharge Diagnosis: Sick sinus syndrome s/p pacemaker placement with a Medtronic Adapta L model ADDRL 1 (serial number [id number]) pacemaker   HPI: Katherine [name] is a 68 y.o. female with a history of CAD s/p intervention to RCA, hypertension, carotid artery disease, HTN, PVCs, LBBB, and prior tobacco abuse who presented to [facility] on 11/13/13 with progressive fatigue and exercise intolerance as well as abdominal pain and back pain. She was found to be bradycardic with rates in the 30's to 40's and EP was consulted.  She has not been on any AV nodal blocking agents. Her thyroid is normal. Last echo 10-2012 demonstrated normal EF with no significant valvular abnormalities. She presented with back pain. Her pain originated in her right breast then through to right back scapula area. This started tarted a couple days prior to admission. The pain progressed and on the day of admission she was lightheaded, weak and  "just feeling bad." Her pain was different from her previous cardiac pain. She has a known history of CAD with overlapping DES to RCA 04/2010. Cath 06/2010 revealed patent stents, nonobst dz otherwise and another repeat LHC in 10/2012 revealed LM nl, LAD 78m, D1/2/3 nl, LCX 20p, OM2 50p, RCA 20% ISR mid, EF 55-60%. She was continued on medical therapy.   Hospital Course: She was admitted for further  ischemic work up and pacemaker placement. Her EKG revealed a HR of 46 and old LBBB. On previous EKGs her HR has been in the 50s. Her HR went further down to 30s after morphine and she became nauseated. She was given atropine with plans for pacemaker placement.  Sick sinus syndrome  -- s/p PPM on 11/14/13: Medtronic Adapta L model ADDRL 1 (serial number [id number]) pacemaker -- Device interrogation was reviewed and normal  -- CXR reveals stable leads   PVCs -- This is chronic and preceeded PPM implant  -- Dr. Rayann Heman advised initiation of a beta blocker. She was very clear that she does not wish to consider new medicines at this time   Back pain - appears benign  -- Treated with tylenol and Flexeril.  -- She knows to contact Dr Mare Ferrari (her PCP also) if her back pain does not resolve with supportive care  CAD - stable with no objective signs of ischemia. --  Continue ASA and statin. It was recommended that she start a BB for CAD and PVCs but patient refusing new medications.  -- 2D ECHO: 11/14/2013: EF 60-65%, mild concentric LVH, no RWMAs. G1DD. Mod LA dilation, mild RA dilation, PA pressure 31.  HTN- stable. Continue HCTZ 12.5 mg and amlodipine 10mg .  Hypokalemia- her K was noted to be 3.1 on the day of discharge. She was given supplementation. I have scheduled a BMET next Tuesday.   HLD: lipid panel with TC 210 and LDL 134. This is not at goal but patient resistant to  more medications. Continue rosuvastatin 10mg .  The patient has had an uncomplicated hospital course and is recovering well. She has been seen by Dr. Rayann Heman today and deemed ready for discharge home. All follow-up appointments have been scheduled. Discharge medications are listed below.    Discharge Vitals: Blood pressure 157/81, pulse 58, temperature 97.6 F (36.4 C), temperature source Oral, resp. rate 20, height 5\' 5"  (1.651 m), weight 129 lb 3 oz (58.6 kg), SpO2 96.00%.  Labs: Lab Results  Component Value Date    WBC 7.0 11/14/2013   HGB 11.7* 11/14/2013   HCT 36.4 11/14/2013   MCV 93.3 11/14/2013   PLT 293 11/14/2013    Recent Labs Lab 11/13/13 1334  11/15/13 0504  NA 143  < > 142  K 3.8  < > 3.1*  CL 104  < > 108  CO2 23  < > 20  BUN 11  < > 8  CREATININE 0.63  < > 0.52  CALCIUM 8.9  < > 7.3*  PROT 7.1  --   --   BILITOT 0.3  --   --   ALKPHOS 51  --   --   ALT 10  --   --   AST 19  --   --   GLUCOSE 87  < > 176*  < > = values in this interval not displayed.  Recent Labs  11/13/13 1854 11/13/13 2318 11/14/13 0548  TROPONINI <0.30 <0.30 <0.30   Lab Results  Component Value Date   CHOL 210* 11/14/2013   HDL 53 11/14/2013   LDLCALC 134* 11/14/2013   TRIG 117 11/14/2013     Diagnostic Studies/Procedures   Dg Chest 2 View  11/15/2013   CLINICAL DATA:  Status post pacemaker placement.  EXAM: CHEST  2 VIEW  COMPARISON:  November 13, 2013.  FINDINGS: The heart size and mediastinal contours are within normal limits. Both lungs are clear. Interval placement of left-sided pacemaker with leads in grossly good position. No pneumothorax or pleural effusion is noted. The visualized skeletal structures are unremarkable.  IMPRESSION: Interval placement of left-sided pacemaker in grossly good position. No pneumothorax is noted.    Dg Chest 2 View  11/13/2013   CLINICAL DATA:  Chest and right back pain since yesterday. History of hypertension.  EXAM: CHEST  2 VIEW  COMPARISON:  10/16/2012 and 06/17/2010.  FINDINGS: The heart size and mediastinal contours are stable. There is stable mild interstitial prominence at both lung bases and stable right apical scarring. No confluent airspace opacity, pleural effusion or pneumothorax is seen. The osseous structures appear unchanged.  IMPRESSION: Stable chest.  No acute cardiopulmonary process.      2D ECHO: 11/14/2013 LV EF: 60% - 65% Study Conclusions - Left ventricle: The cavity size was normal. There was mild concentric hypertrophy. Systolic function was  normal. The estimated ejection fraction was in the range of 60% to 65%. Wall motion was normal; there were no regional wall motion abnormalities. Doppler parameters are consistent with abnormal left ventricular relaxation (grade 1 diastolic dysfunction). - Mitral valve: Calcified annulus. - Left atrium: The atrium was moderately dilated. - Right atrium: The atrium was mildly dilated. - Pulmonary arteries: PA peak pressure: 31 mm Hg (S).     Discharge Medications     Medication List         amLODipine 10 MG tablet  Commonly known as:  NORVASC  Take 1 tablet (10 mg total) by mouth daily.     aspirin 81 MG tablet  Take 81 mg by mouth daily.     cyclobenzaprine 5 MG tablet  Commonly known as:  FLEXERIL  Take 1 tablet (5 mg total) by mouth 3 (three) times daily as needed for muscle spasms.     hydrochlorothiazide 12.5 MG capsule  Commonly known as:  MICROZIDE  Take 1 capsule (12.5 mg total) by mouth daily.     nitroGLYCERIN 0.4 MG SL tablet  Commonly known as:  NITROSTAT  Place 0.4 mg under the tongue every 5 (five) minutes as needed for chest pain.     rosuvastatin 10 MG tablet  Commonly known as:  CRESTOR  Take 10 mg by mouth as directed. Monday, Wednesday, and Friday only     vitamin C 500 MG tablet  Commonly known as:  ASCORBIC ACID  Take 250 mg by mouth daily.     VITAMIN D PO  Take 1 tablet by mouth daily.        Disposition   The patient will be discharged in stable condition to home.  Follow-up Information   Follow up with [facility] On 11/25/2013. ([address]pt @ 11am for wound check )    Contact information:   [address] [place] [address]       Follow up with Lakisha Peyser Grayer, MD On 02/10/2014. (@ 2:15 pm )    Specialty:  Cardiology   Contact information:   [place] [address] [phone]       Follow up with [facility] On 11/19/2013. ([address]. Anytime between 7:30am and 4:30 pm  for lab work )    Sport and exercise psychologist information:   [place] [phone]         Duration of Discharge Encounter: Greater than 30 minutes including physician and PA time.  SignedVertell Limber, Terek Bee PA-C 11/15/2013, 10:51 AM

## 2013-11-15 NOTE — Progress Notes (Signed)
Patient had a 6 beat run of V-tach, asymptomatic, on call cardiologist made aware, no new orders given. Will continue to monitor patient.

## 2013-11-15 NOTE — Progress Notes (Signed)
Pt discharged to home per MD order. Pt and husband received and reviewed all discharge instructions and medication information including follow-up appointments and prescription information. Pt and husband verbalized understanding. Pt IV and telemetry box removed prior to discharge. Pt alert and oriented at discharge. Pt escorted to private vehicle via wheelchair by guest services volunteer. Lenna Sciara

## 2013-11-15 NOTE — Progress Notes (Signed)
SUBJECTIVE: The patient is doing well today.  She has back pain which she has had intermittently.  At this time, she denies chest pain, shortness of breath, palpitations, dizziness, presyncope , syncope or any new concerns.  [name] aspirin EC  81 mg Oral Daily  . atorvastatin  20 mg Oral q1800  .  ceFAZolin (ANCEF) IV  1 g Intravenous Q6H  . cholecalciferol  400 Units Oral Daily  . potassium chloride  40 mEq Oral Once  . sodium chloride  3 mL Intravenous Q12H      OBJECTIVE: Physical Exam: Filed Vitals:   11/14/13 2200 11/14/13 2301 11/15/13 0445 11/15/13 0513  BP: 163/80 141/67 157/81   Pulse: 65 82 72 58  Temp:    97.6 F (36.4 C)  TempSrc:    Oral  Resp:    20  Height:      Weight:    129 lb 3 oz (58.6 kg)  SpO2:    96%    Intake/Output Summary (Last 24 hours) at 11/15/13 0744 Last data filed at 11/14/13 2210  Gross per 24 hour  Intake    353 ml  Output   1250 ml  Net   -897 ml    Telemetry reveals atrial pacing,  There are frequent PVCs,  A single episode of nsvt is also noted  GEN- The patient is well appearing, alert and oriented x 3 today.   Head- normocephalic, atraumatic Eyes-  Sclera clear, conjunctiva pink Ears- hearing intact Oropharynx- clear Neck- supple,   Lungs- Clear to ausculation bilaterally, normal work of breathing Heart- Regular rate and rhythm, no murmurs, rubs or gallops, PMI not laterally displaced GI- soft, NT, ND, + BS Extremities- no clubbing, cyanosis, or edema Skin- pacemaker site is without hematoma Neuro- strength and sensation are intact  LABS: Basic Metabolic Panel:  Recent Labs  11/13/13 1854 11/14/13 0548 11/15/13 0504  NA  --  141 142  K  --  4.2 3.1*  CL  --  105 108  CO2  --  21 20  GLUCOSE  --  81 176*  BUN  --  11 8  CREATININE  --  0.57 0.52  CALCIUM  --  8.7 7.3*  MG 1.9  --   --    Liver Function Tests:  Recent Labs  11/13/13 1334  AST 19  ALT 10  ALKPHOS 51  BILITOT 0.3  PROT 7.1  ALBUMIN 3.9    No results found for this basename: LIPASE, AMYLASE,  in the last 72 hours CBC:  Recent Labs  11/13/13 1334 11/14/13 0548  WBC 4.9 7.0  HGB 12.3 11.7*  HCT 37.8 36.4  MCV 94.5 93.3  PLT 276 293   Cardiac Enzymes:  Recent Labs  11/13/13 1854 11/13/13 2318 11/14/13 0548  TROPONINI <0.30 <0.30 <0.30   BNP: No components found with this basename: POCBNP,  D-Dimer: No results found for this basename: DDIMER,  in the last 72 hours Hemoglobin A1C:  Recent Labs  11/13/13 1854  HGBA1C 5.7*   Fasting Lipid Panel:  Recent Labs  11/14/13 0548  CHOL 210*  HDL 53  LDLCALC 134*  TRIG 117  CHOLHDL 4.0   Thyroid Function Tests:  Recent Labs  11/13/13 1854  TSH 2.440   Anemia Panel: No results found for this basename: VITAMINB12, FOLATE, FERRITIN, TIBC, IRON, RETICCTPCT,  in the last 72 hours  RADIOLOGY: Dg Chest 2 View  11/15/2013   CLINICAL DATA:  Status post pacemaker placement.  EXAM: CHEST  2 VIEW  COMPARISON:  November 13, 2013.  FINDINGS: The heart size and mediastinal contours are within normal limits. Both lungs are clear. Interval placement of left-sided pacemaker with leads in grossly good position. No pneumothorax or pleural effusion is noted. The visualized skeletal structures are unremarkable.  IMPRESSION: Interval placement of left-sided pacemaker in grossly good position. No pneumothorax is noted.   Electronically Signed   By: Sabino Dick M.D.   On: 11/15/2013 07:41   Dg Chest 2 View  11/13/2013   CLINICAL DATA:  Chest and right back pain since yesterday. History of hypertension.  EXAM: CHEST  2 VIEW  COMPARISON:  10/16/2012 and 06/17/2010.  FINDINGS: The heart size and mediastinal contours are stable. There is stable mild interstitial prominence at both lung bases and stable right apical scarring. No confluent airspace opacity, pleural effusion or pneumothorax is seen. The osseous structures appear unchanged.  IMPRESSION: Stable chest.  No acute  cardiopulmonary process.   Electronically Signed   By: Camie Patience M.D.   On: 11/13/2013 14:19    ASSESSMENT AND PLAN:  Principal Problem:   Back pain Active Problems:   Coronary artery disease, hx of stents to RCA   History of hypercholesterolemia   Left bundle-branch block   PVC's (premature ventricular contractions)   Sinus bradycardia   Angina at rest   Symptomatic sinus bradycardia  1. Sick sinus syndrome Doing well s/p PPM Device interrogation is reviewed and normal cxr reveals stable leads  2. pvcs This is chronic and preceeds PPM implant I have advised initiation of a beta blocker.  She is very clear that she does not wish to consider new medicines at this time  3. Back pain Appears benign Tylenol Follow-up with Dr Mare Ferrari if not improved  4. CAD Stable No change required today  Wound check with device clinic in 10 days Follow-up with me in 3 months She should contact Dr Mare Ferrari  (her PCP also)  if her back pain does not resolve with supportive care   DC to home today  Thompson Grayer, MD 11/15/2013 7:44 AM

## 2013-11-15 NOTE — Discharge Instructions (Signed)
° ° °  Supplemental Discharge Instructions for  Pacemaker/Defibrillator Patients  Activity No heavy lifting or vigorous activity with your left/right arm for 6 to 8 weeks.  Do not raise your left/right arm above your head for one week.  Gradually raise your affected arm as drawn below.               8/1                          8/2                           8/3                         8/4 __  NO DRIVING for 1 week ; you may begin driving on 06/19/17.  WOUND CARE   Keep the wound area clean and dry.  Do not get this area wet for one week. No showers for one week; you may shower on     .   The tape/steri-strips on your wound will fall off; do not pull them off.  No bandage is needed on the site.  DO  NOT apply any creams, oils, or ointments to the wound area.   If you notice any drainage or discharge from the wound, any swelling or bruising at the site, or you develop a fever > 101? F after you are discharged home, call the office at once.  Special Instructions   You are still able to use cellular telephones; use the ear opposite the side where you have your pacemaker/defibrillator.  Avoid carrying your cellular phone near your device.   When traveling through airports, show security personnel your identification card to avoid being screened in the metal detectors.  Ask the security personnel to use the hand wand.   Avoid arc welding equipment, MRI testing (magnetic resonance imaging), TENS units (transcutaneous nerve stimulators).  Call the office for questions about other devices.   Avoid electrical appliances that are in poor condition or are not properly grounded.   Microwave ovens are safe to be near or to operate.  Additional information for defibrillator patients should your device go off:   If your device goes off ONCE and you feel fine afterward, notify the device clinic nurses.   If your device goes off ONCE and you do not feel well afterward, call 911.   If your device goes off  TWICE, call 911.   If your device goes off THREE times in one day, call 911.  DO NOT DRIVE YOURSELF OR A FAMILY MEMBER WITH A DEFIBRILLATOR TO THE HOSPITAL--CALL 911.

## 2013-11-15 NOTE — Telephone Encounter (Signed)
Placed samples of crestor back in closet on 11/12/13

## 2013-11-15 NOTE — Progress Notes (Signed)
Patient potassium 3.1 and calcium 7.3, on call cardiologist paged,awaiting orders, will continue to monitor patient.

## 2013-11-15 NOTE — Discharge Summary (Signed)
  Co Sign: Thompson Grayer, MD 11/15/2013 11:53 AM

## 2013-11-19 ENCOUNTER — Other Ambulatory Visit: Payer: 59

## 2013-11-25 ENCOUNTER — Encounter: Payer: Self-pay | Admitting: Internal Medicine

## 2013-11-25 ENCOUNTER — Ambulatory Visit (INDEPENDENT_AMBULATORY_CARE_PROVIDER_SITE_OTHER): Payer: 59 | Admitting: *Deleted

## 2013-11-25 DIAGNOSIS — I495 Sick sinus syndrome: Secondary | ICD-10-CM

## 2013-11-25 DIAGNOSIS — Z950 Presence of cardiac pacemaker: Secondary | ICD-10-CM

## 2013-11-25 LAB — MDC_IDC_ENUM_SESS_TYPE_INCLINIC
Battery Impedance: 100 Ohm
Battery Remaining Longevity: 133 mo
Battery Voltage: 2.79 V
Brady Statistic AP VP Percent: 2 %
Lead Channel Impedance Value: 657 Ohm
Lead Channel Pacing Threshold Amplitude: 0.5 V
Lead Channel Pacing Threshold Pulse Width: 0.4 ms
Lead Channel Sensing Intrinsic Amplitude: 1 mV
Lead Channel Setting Pacing Amplitude: 2.5 V
Lead Channel Setting Pacing Pulse Width: 0.4 ms
Lead Channel Setting Sensing Sensitivity: 4 mV
MDC IDC MSMT LEADCHNL RA IMPEDANCE VALUE: 473 Ohm
MDC IDC MSMT LEADCHNL RA PACING THRESHOLD AMPLITUDE: 0.75 V
MDC IDC MSMT LEADCHNL RA PACING THRESHOLD PULSEWIDTH: 0.4 ms
MDC IDC MSMT LEADCHNL RV SENSING INTR AMPL: 8 mV
MDC IDC SESS DTM: 20150810111325
MDC IDC SET LEADCHNL RA PACING AMPLITUDE: 3.5 V
MDC IDC STAT BRADY AP VS PERCENT: 64 %
MDC IDC STAT BRADY AS VP PERCENT: 3 %
MDC IDC STAT BRADY AS VS PERCENT: 31 %

## 2013-11-25 NOTE — Progress Notes (Signed)
Wound check appointment. Steri-strips removed. Wound without redness or edema. Incision edges approximated, wound well healed. Normal device function. Thresholds, sensing, and impedances consistent with implant measurements. Device programmed at 3.5V/auto capture programmed on for extra safety margin until 3 month visit. Histogram distribution appropriate for patient and level of activity. 3 mode switches--all less than 1 minute. 1 high ventricular rate noted lasting 3 beats. Patient educated about wound care, arm mobility, lifting restrictions. ROV in 3 months with JA.

## 2013-12-11 ENCOUNTER — Emergency Department (HOSPITAL_COMMUNITY): Payer: PRIVATE HEALTH INSURANCE

## 2013-12-11 ENCOUNTER — Emergency Department (HOSPITAL_COMMUNITY)
Admission: EM | Admit: 2013-12-11 | Discharge: 2013-12-11 | Disposition: A | Discharge status: Home / Self Care | Payer: PRIVATE HEALTH INSURANCE | Attending: Emergency Medicine | Admitting: Emergency Medicine

## 2013-12-11 ENCOUNTER — Encounter (HOSPITAL_COMMUNITY): Payer: Self-pay | Admitting: Emergency Medicine

## 2013-12-11 DIAGNOSIS — R110 Nausea: Secondary | ICD-10-CM | POA: Insufficient documentation

## 2013-12-11 DIAGNOSIS — Z7982 Long term (current) use of aspirin: Secondary | ICD-10-CM | POA: Insufficient documentation

## 2013-12-11 DIAGNOSIS — Z79899 Other long term (current) drug therapy: Secondary | ICD-10-CM | POA: Diagnosis not present

## 2013-12-11 DIAGNOSIS — I2510 Atherosclerotic heart disease of native coronary artery without angina pectoris: Secondary | ICD-10-CM | POA: Insufficient documentation

## 2013-12-11 DIAGNOSIS — Z87891 Personal history of nicotine dependence: Secondary | ICD-10-CM | POA: Insufficient documentation

## 2013-12-11 DIAGNOSIS — E785 Hyperlipidemia, unspecified: Secondary | ICD-10-CM | POA: Diagnosis not present

## 2013-12-11 DIAGNOSIS — I10 Essential (primary) hypertension: Secondary | ICD-10-CM | POA: Insufficient documentation

## 2013-12-11 DIAGNOSIS — Z9889 Other specified postprocedural states: Secondary | ICD-10-CM | POA: Diagnosis not present

## 2013-12-11 DIAGNOSIS — R42 Dizziness and giddiness: Secondary | ICD-10-CM

## 2013-12-11 LAB — COMPREHENSIVE METABOLIC PANEL
ALT: 9 U/L (ref 0–35)
ANION GAP: 14 (ref 5–15)
AST: 20 U/L (ref 0–37)
Albumin: 3.9 g/dL (ref 3.5–5.2)
Alkaline Phosphatase: 58 U/L (ref 39–117)
BUN: 11 mg/dL (ref 6–23)
CO2: 27 mEq/L (ref 19–32)
Calcium: 9.8 mg/dL (ref 8.4–10.5)
Chloride: 103 mEq/L (ref 96–112)
Creatinine, Ser: 0.77 mg/dL (ref 0.50–1.10)
GFR calc Af Amer: >90 mL/min (ref >90)
GFR calc non Af Amer: 86 mL/min — ABNORMAL LOW (ref >90)
Glucose, Bld: 91 mg/dL (ref 70–99)
POTASSIUM: 4.2 meq/L (ref 3.7–5.3)
SODIUM: 144 meq/L (ref 137–147)
TOTAL PROTEIN: 7.8 g/dL (ref 6.0–8.3)
Total Bilirubin: 0.3 mg/dL (ref 0.3–1.2)

## 2013-12-11 LAB — URINE MICROSCOPIC-ADD ON

## 2013-12-11 LAB — URINALYSIS, ROUTINE W REFLEX MICROSCOPIC
Bilirubin Urine: NEGATIVE
Glucose, UA: NEGATIVE mg/dL
Hgb urine dipstick: NEGATIVE
KETONES UR: NEGATIVE mg/dL
NITRITE: NEGATIVE
PH: 6 (ref 5.0–8.0)
PROTEIN: NEGATIVE mg/dL
Specific Gravity, Urine: 1.008 (ref 1.005–1.030)
Urobilinogen, UA: 0.2 mg/dL (ref 0.0–1.0)

## 2013-12-11 LAB — CBC
HCT: 37.9 % (ref 36.0–46.0)
HEMOGLOBIN: 12.4 g/dL (ref 12.0–15.0)
MCH: 30.5 pg (ref 26.0–34.0)
MCHC: 32.7 g/dL (ref 30.0–36.0)
MCV: 93.1 fL (ref 78.0–100.0)
Platelets: 310 10*3/uL (ref 150–400)
RBC: 4.07 MIL/uL (ref 3.87–5.11)
RDW: 14.7 % (ref 11.5–15.5)
WBC: 6.1 10*3/uL (ref 4.0–10.5)

## 2013-12-11 LAB — I-STAT TROPONIN, ED: Troponin i, poc: 0 ng/mL (ref 0.00–0.08)

## 2013-12-11 MED ORDER — CEFPODOXIME PROXETIL 100 MG PO TABS: 100.0000 mg | ORAL_TABLET | Freq: Two times a day (BID) | ORAL | Status: DC

## 2013-12-11 MED ORDER — MECLIZINE HCL 12.5 MG PO TABS: 12.5000 mg | ORAL_TABLET | Freq: Three times a day (TID) | ORAL | Status: DC | PRN

## 2013-12-11 MED ORDER — MECLIZINE HCL 25 MG PO TABS
12.5000 mg | ORAL_TABLET | Freq: Once | ORAL | Status: AC
  Administered 2013-12-11: 12.5 mg via ORAL
  Filled 2013-12-11: qty 1

## 2013-12-11 NOTE — Discharge Instructions (Signed)
Benign Positional Vertigo    Vertigo means you feel like you or your surroundings are moving when they are not. Benign positional vertigo is the most common form of vertigo. Benign means that the cause of your condition is not serious. Benign positional vertigo is more common in older adults.  CAUSES    Benign positional vertigo is the result of an upset in the labyrinth system. This is an area in the middle ear that helps control your balance. This may be caused by a viral infection, head injury, or repetitive motion. However, often no specific cause is found.  SYMPTOMS    Symptoms of benign positional vertigo occur when you move your head or eyes in different directions. Some of the symptoms may include:  · Loss of balance and falls.  · Vomiting.  · Blurred vision.  · Dizziness.  · Nausea.  · Involuntary eye movements (nystagmus).  DIAGNOSIS    Benign positional vertigo is usually diagnosed by physical exam. If the specific cause of your benign positional vertigo is unknown, your caregiver may perform imaging tests, such as magnetic resonance imaging (MRI) or computed tomography (CT).  TREATMENT    Your caregiver may recommend movements or procedures to correct the benign positional vertigo. Medicines such as meclizine, benzodiazepines, and medicines for nausea may be used to treat your symptoms. In rare cases, if your symptoms are caused by certain conditions that affect the inner ear, you may need surgery.  HOME CARE INSTRUCTIONS    · Follow your caregiver's instructions.  · Move slowly. Do not make sudden body or head movements.  · Avoid driving.  · Avoid operating heavy machinery.  · Avoid performing any tasks that would be dangerous to you or others during a vertigo episode.  · Drink enough fluids to keep your urine clear or pale yellow.  SEEK IMMEDIATE MEDICAL CARE IF:    · You develop problems with walking, weakness, numbness, or using your arms, hands, or legs.  · You have difficulty speaking.  · You  develop severe headaches.  · Your nausea or vomiting continues or gets worse.  · You develop visual changes.  · Your family or friends notice any behavioral changes.  · Your condition gets worse.  · You have a fever.  · You develop a stiff neck or sensitivity to light.  MAKE SURE YOU:    · Understand these instructions.  · Will watch your condition.  · Will get help right away if you are not doing well or get worse.  Document Released: 01/10/2006 Document Revised: 06/27/2011 Document Reviewed: 12/23/2010  ExitCare® Patient Information ©2015 [employer]. This information is not intended to replace advice given to you by your health care provider. Make sure you discuss any questions you have with your health care provider.      Epley Maneuver Self-Care  WHAT IS THE EPLEY MANEUVER?  The Epley maneuver is an exercise you can do to relieve symptoms of benign paroxysmal positional vertigo (BPPV). This condition is often just referred to as vertigo. BPPV is caused by the movement of tiny crystals (canaliths) inside your inner ear. The accumulation and movement of canaliths in your inner ear causes a sudden spinning sensation (vertigo) when you move your head to certain positions. Vertigo usually lasts about 30 seconds. BPPV usually occurs in just one ear. If you get vertigo when you lie on your left side, you probably have BPPV in your left ear. Your health care provider can tell you which ear is involved.    BPPV may   be caused by a head injury. Many people older than 50 get BPPV for unknown reasons. If you have been diagnosed with BPPV, your health care provider may teach you how to do this maneuver. BPPV is not life threatening (benign) and usually goes away in time.    WHEN SHOULD I PERFORM THE EPLEY MANEUVER?  You can do this maneuver at home whenever you have symptoms of vertigo. You may do the Epley maneuver up to 3 times a day until your symptoms of vertigo go away.  HOW SHOULD I DO THE EPLEY MANEUVER?  1. Sit on  the edge of a bed or table with your back straight. Your legs should be extended or hanging over the edge of the bed or table.    2. Turn your head halfway toward the affected ear.    3. Lie backward quickly with your head turned until you are lying flat on your back. You may want to position a pillow under your shoulders.    4. Hold this position for 30 seconds. You may experience an attack of vertigo. This is normal. Hold this position until the vertigo stops.  5. Then turn your head to the opposite direction until your unaffected ear is facing the floor.    6. Hold this position for 30 seconds. You may experience an attack of vertigo. This is normal. Hold this position until the vertigo stops.  7. Now turn your whole body to the same side as your head. Hold for another 30 seconds.    8. You can then sit back up.  ARE THERE RISKS TO THIS MANEUVER?  In some cases, you may have other symptoms (such as changes in your vision, weakness, or numbness). If you have these symptoms, stop doing the maneuver and call your health care provider. Even if doing these maneuvers relieves your vertigo, you may still have dizziness. Dizziness is the sensation of light-headedness but without the sensation of movement. Even though the Epley maneuver may relieve your vertigo, it is possible that your symptoms will return within 5 years.  WHAT SHOULD I DO AFTER THIS MANEUVER?  After doing the Epley maneuver, you can return to your normal activities. Ask your doctor if there is anything you should do at home to prevent vertigo. This may include:  · Sleeping with two or more pillows to keep your head elevated.  · Not sleeping on the side of your affected ear.  · Getting up slowly from bed.  · Avoiding sudden movements during the day.  · Avoiding extreme head movement, like looking up or bending over.  · Wearing a cervical collar to prevent sudden head movements.  WHAT SHOULD I DO IF MY SYMPTOMS GET WORSE?  Call your health care provider  if your vertigo gets worse. Call your provider right way if you have other symptoms, including:    · Nausea.  · Vomiting.  · Headache.  · Weakness.  · Numbness.  · Vision changes.  Document Released: 04/09/2013 Document Reviewed: 04/09/2013  ExitCare® Patient Information ©2015 [employer]. This information is not intended to replace advice given to you by your health care provider. Make sure you discuss any questions you have with your health care provider.   
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
## 2013-12-11 NOTE — ED Notes (Signed)
Pt ambulated with standby assistance. Denies any dizziness, lightheadedness or blurred vision. No noted difficulty walking.

## 2013-12-11 NOTE — ED Notes (Signed)
Got dizzy this am just had pacemaker placed July 29  Has only had cereal this am . No n/v/cp.  Dr Katheran James
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
## 2013-12-25 ENCOUNTER — Other Ambulatory Visit: Payer: Self-pay

## 2013-12-25 MED ORDER — AMLODIPINE BESYLATE 10 MG PO TABS: 10.0000 mg | ORAL_TABLET | Freq: Every day | ORAL | Status: DC

## 2014-01-17 ENCOUNTER — Ambulatory Visit: Payer: PRIVATE HEALTH INSURANCE | Admitting: Cardiology

## 2014-02-10 ENCOUNTER — Encounter: Payer: Self-pay | Admitting: Internal Medicine

## 2014-02-10 ENCOUNTER — Ambulatory Visit (INDEPENDENT_AMBULATORY_CARE_PROVIDER_SITE_OTHER): Payer: PRIVATE HEALTH INSURANCE | Admitting: Internal Medicine

## 2014-02-10 ENCOUNTER — Other Ambulatory Visit: Payer: Self-pay | Admitting: Internal Medicine

## 2014-02-10 VITALS — BP 150/80 | HR 66 | Ht 65.0 in | Wt 136.0 lb

## 2014-02-10 DIAGNOSIS — I119 Hypertensive heart disease without heart failure: Secondary | ICD-10-CM

## 2014-02-10 DIAGNOSIS — R001 Bradycardia, unspecified: Secondary | ICD-10-CM

## 2014-02-10 DIAGNOSIS — I495 Sick sinus syndrome: Secondary | ICD-10-CM

## 2014-02-10 LAB — MDC_IDC_ENUM_SESS_TYPE_INCLINIC
Battery Remaining Longevity: 148 mo
Battery Voltage: 2.79 V
Brady Statistic AS VP Percent: 1 %
Brady Statistic AS VS Percent: 17 %
Lead Channel Impedance Value: 759 Ohm
Lead Channel Pacing Threshold Amplitude: 0.75 V
Lead Channel Pacing Threshold Amplitude: 0.75 V
Lead Channel Pacing Threshold Pulse Width: 0.4 ms
Lead Channel Pacing Threshold Pulse Width: 0.4 ms
Lead Channel Sensing Intrinsic Amplitude: 11.2 mV
Lead Channel Setting Pacing Amplitude: 2 V
Lead Channel Setting Pacing Pulse Width: 0.4 ms
Lead Channel Setting Sensing Sensitivity: 4 mV
MDC IDC MSMT BATTERY IMPEDANCE: 100 Ohm
MDC IDC MSMT LEADCHNL RA IMPEDANCE VALUE: 530 Ohm
MDC IDC MSMT LEADCHNL RA SENSING INTR AMPL: 2 mV
MDC IDC SESS DTM: 20151026151358
MDC IDC SET LEADCHNL RV PACING AMPLITUDE: 2.5 V
MDC IDC STAT BRADY AP VP PERCENT: 1 %
MDC IDC STAT BRADY AP VS PERCENT: 81 %

## 2014-02-10 NOTE — Patient Instructions (Signed)
Your physician wants you to follow-up in: 12 months with Dr Vallery Ridge will receive a reminder letter in the mail two months in advance. If you don't receive a letter, please call our office to schedule the follow-up appointment.  Remote monitoring is used to monitor your Pacemaker or ICD from home. This monitoring reduces the number of office visits required to check your device to one time per year. It allows [place] to keep an eye on the functioning of your device to ensure it is working properly. You are scheduled for a device check from home on 05/14/13. You may send your transmission at any time that day. If you have a wireless device, the transmission will be sent automatically. After your physician reviews your transmission, you will receive a postcard with your next transmission date.

## 2014-02-10 NOTE — Progress Notes (Signed)
Primary Cardiologist:  Dr Roswell Nickel is a 68 y.o. female who presents today for routine electrophysiology followup.  Since her recent PPM implant, the patient reports doing very well.  Today, she denies symptoms of palpitations, chest pain, shortness of breath,  lower extremity edema, dizziness, presyncope, or syncope.  The patient is otherwise without complaint today.   Past Medical History  Diagnosis Date  . Coronary artery disease     a. s/p overlapping DES to RCA 04/2010 (days after neg stress test). b. Cath 06/2010 - patent stents, nonobst dz otherwise; c. 10/2012 Cath: LM nl, LAD 38m, D1/2/3 nl, LCX 20p, OM2 50p, RCA 20% ISR mid, EF 55-60% ->Med Rx.    [name] Hypertension   . HLD (hyperlipidemia)   . Left bundle-branch block   . Tobacco abuse   . Sinus bradycardia     a. In past prevented BB use.  . Monoclonal gammopathy of undetermined significance     a. Previously seen by heme - this is per chart but pt does not recall.  [name] PVC's (premature ventricular contractions)   . Carotid artery disease     a. Hx r carotid bruit - dopplers 2009 <50% bilat;  b. 10/2012 U/S: no hemodynamically signif stenosis.  . Sick sinus syndrome     a. s/p PPM on 11/14/13 with Medtronic Adapta L model ADDRL 1 (serial number [id number]) pacemaker  . History of echocardiogram     a. 2D ECHO: 11/14/2013: EF 60-65%, mild concentric LVH, no RWMAs. G1DD. Mod LA dilation, mild RA dilation, PA pressure 31.   Past Surgical History  Procedure Laterality Date  . Pci      HIGH GRADE LESION IN THE MIDPORTION OF THE RIGHT CORONARY ARTERY WHICH WAS A LARGE ARTERY  . Cardiac catheterization  05/14/10    SHOWED EVIDENCE OF REVERSIBLE VASOSPASM  . Pacemaker insertion  11/14/13    MDT Adapta L implanted by Dr Rayann Heman for Sick sinus syndrome    ROS- all systems are reviewed and negative except as per HPI above  Current Outpatient Prescriptions  Medication Sig Dispense Refill  . amLODipine (NORVASC) 10 MG tablet  Take 1 tablet (10 mg total) by mouth daily.  30 tablet  3  . Ascorbic Acid (VITAMIN C) 500 MG tablet Take 250 mg by mouth daily.       [name] aspirin 81 MG tablet Take 81 mg by mouth daily.       [name] CALCIUM PO Take 1 capsule by mouth daily.      . Cholecalciferol (VITAMIN D PO) Take 1 tablet by mouth daily.      . cyclobenzaprine (FLEXERIL) 5 MG tablet Take 5 mg by mouth 3 (three) times daily as needed for muscle spasms.      . hydrochlorothiazide (MICROZIDE) 12.5 MG capsule Take 12.5 mg by mouth daily.      . meclizine (ANTIVERT) 12.5 MG tablet Take 1 tablet (12.5 mg total) by mouth 3 (three) times daily as needed for dizziness.  30 tablet  0  . nitroGLYCERIN (NITROSTAT) 0.4 MG SL tablet Place 0.4 mg under the tongue every 5 (five) minutes as needed for chest pain (MAX 3 TABLETS).       . rosuvastatin (CRESTOR) 10 MG tablet Take 10 mg by mouth as directed. Monday, Wednesday, and Friday only       No current facility-administered medications for this visit.    Physical Exam: Filed Vitals:   02/10/14 1440  BP: 150/80  Pulse: 66  Height: 5\' 5"  (1.651 m)  Weight: 136 lb (61.689 kg)    GEN- The patient is well appearing, alert and oriented x 3 today.   Head- normocephalic, atraumatic Eyes-  Sclera clear, conjunctiva pink Ears- hearing intact Oropharynx- clear Lungs- Clear to ausculation bilaterally, normal work of breathing Chest- pacemaker pocket is well healed Heart- Regular rate and rhythm, no murmurs, rubs or gallops, PMI not laterally displaced GI- soft, NT, ND, + BS Extremities- no clubbing, cyanosis, or edema  Pacemaker interrogation- reviewed in detail today,  See PACEART report  Assessment and Plan:  1. Sick sinus syndrome Normal pacemaker function See Pace Art report No changes today  2. Hypertensive cardiovascular disease Stable No change required today  3. CAD No ischemic symptoms  Follow-up with Dr Mare Ferrari as scheduled carelink every 3 months Return to the  device clinic in 12 months

## 2014-02-27 ENCOUNTER — Telehealth: Payer: Self-pay | Admitting: Cardiology

## 2014-02-27 ENCOUNTER — Telehealth: Payer: Self-pay

## 2014-02-27 NOTE — Telephone Encounter (Signed)
New message      Pt has a pacemaker----now back/shoulder pain.  Pain is also under rt breast.

## 2014-02-27 NOTE — Telephone Encounter (Signed)
Patient reports she is having pain under right shoulder blade that radiates to underneath her right breast. Says it is a 6 on scale of 1-10.  Denies chest pain, sob, lightheadedness. No N/V or diaphoresis. She tried some Ibuprofen without relief. She states it is similar to the pain she had before her pacemaker was inserted. i advised her that at this late hour there was no further openings here. Advised her to go to [place] for evaluation. She and husband are agreeable to plan.

## 2014-02-27 NOTE — Telephone Encounter (Signed)
Telephone encounter already documented.

## 2014-02-27 NOTE — Telephone Encounter (Signed)
Agree with advice given

## 2014-02-28 ENCOUNTER — Telehealth: Payer: Self-pay | Admitting: Cardiology

## 2014-02-28 ENCOUNTER — Encounter (HOSPITAL_COMMUNITY): Payer: Self-pay | Admitting: Emergency Medicine

## 2014-02-28 ENCOUNTER — Emergency Department (HOSPITAL_COMMUNITY)
Admission: EM | Admit: 2014-02-28 | Discharge: 2014-03-01 | Disposition: A | Discharge status: Home / Self Care | Payer: PRIVATE HEALTH INSURANCE | Attending: Emergency Medicine | Admitting: Emergency Medicine

## 2014-02-28 DIAGNOSIS — R079 Chest pain, unspecified: Secondary | ICD-10-CM | POA: Diagnosis not present

## 2014-02-28 DIAGNOSIS — Z7982 Long term (current) use of aspirin: Secondary | ICD-10-CM | POA: Insufficient documentation

## 2014-02-28 DIAGNOSIS — I10 Essential (primary) hypertension: Secondary | ICD-10-CM | POA: Diagnosis not present

## 2014-02-28 DIAGNOSIS — I2510 Atherosclerotic heart disease of native coronary artery without angina pectoris: Secondary | ICD-10-CM | POA: Diagnosis not present

## 2014-02-28 DIAGNOSIS — M546 Pain in thoracic spine: Secondary | ICD-10-CM | POA: Insufficient documentation

## 2014-02-28 DIAGNOSIS — Z950 Presence of cardiac pacemaker: Secondary | ICD-10-CM | POA: Insufficient documentation

## 2014-02-28 DIAGNOSIS — E785 Hyperlipidemia, unspecified: Secondary | ICD-10-CM | POA: Insufficient documentation

## 2014-02-28 DIAGNOSIS — Z862 Personal history of diseases of the blood and blood-forming organs and certain disorders involving the immune mechanism: Secondary | ICD-10-CM | POA: Diagnosis not present

## 2014-02-28 DIAGNOSIS — Z79899 Other long term (current) drug therapy: Secondary | ICD-10-CM | POA: Insufficient documentation

## 2014-02-28 DIAGNOSIS — Z87891 Personal history of nicotine dependence: Secondary | ICD-10-CM | POA: Insufficient documentation

## 2014-02-28 DIAGNOSIS — Z9889 Other specified postprocedural states: Secondary | ICD-10-CM | POA: Diagnosis not present

## 2014-02-28 LAB — CBC WITH DIFFERENTIAL/PLATELET
Basophils Absolute: 0 10*3/uL (ref 0.0–0.1)
Basophils Relative: 1 % (ref 0–1)
EOS ABS: 0.2 10*3/uL (ref 0.0–0.7)
EOS PCT: 4 % (ref 0–5)
HCT: 34.1 % — ABNORMAL LOW (ref 36.0–46.0)
Hemoglobin: 11.4 g/dL — ABNORMAL LOW (ref 12.0–15.0)
Lymphocytes Relative: 30 % (ref 12–46)
Lymphs Abs: 1.9 10*3/uL (ref 0.7–4.0)
MCH: 31.4 pg (ref 26.0–34.0)
MCHC: 33.4 g/dL (ref 30.0–36.0)
MCV: 93.9 fL (ref 78.0–100.0)
Monocytes Absolute: 0.8 10*3/uL (ref 0.1–1.0)
Monocytes Relative: 12 % (ref 3–12)
Neutro Abs: 3.5 10*3/uL (ref 1.7–7.7)
Neutrophils Relative %: 53 % (ref 43–77)
PLATELETS: 304 10*3/uL (ref 150–400)
RBC: 3.63 MIL/uL — AB (ref 3.87–5.11)
RDW: 13.9 % (ref 11.5–15.5)
WBC: 6.5 10*3/uL (ref 4.0–10.5)

## 2014-02-28 LAB — COMPREHENSIVE METABOLIC PANEL
ALT: 10 U/L (ref 0–35)
AST: 18 U/L (ref 0–37)
Albumin: 3.5 g/dL (ref 3.5–5.2)
Alkaline Phosphatase: 65 U/L (ref 39–117)
Anion gap: 14 (ref 5–15)
BUN: 14 mg/dL (ref 6–23)
CALCIUM: 9 mg/dL (ref 8.4–10.5)
CO2: 24 mEq/L (ref 19–32)
Chloride: 106 mEq/L (ref 96–112)
Creatinine, Ser: 0.72 mg/dL (ref 0.50–1.10)
GFR calc non Af Amer: 87 mL/min — ABNORMAL LOW (ref >90)
Glucose, Bld: 106 mg/dL — ABNORMAL HIGH (ref 70–99)
Potassium: 3.8 mEq/L (ref 3.7–5.3)
SODIUM: 144 meq/L (ref 137–147)
TOTAL PROTEIN: 6.9 g/dL (ref 6.0–8.3)
Total Bilirubin: 0.3 mg/dL (ref 0.3–1.2)

## 2014-02-28 LAB — TROPONIN I: Troponin I: <0.3 ng/mL (ref <0.30)

## 2014-02-28 NOTE — ED Notes (Signed)
Per EMS, pt called for chest pain similar to when she had a stent and pacemaker placed 2 years ago. Pt had 5 x nitro and ASA 324 en route. EKG shows paced rhythm, pt in nad.

## 2014-02-28 NOTE — ED Provider Notes (Signed)
CSN: [id number]     Arrival date & time 02/28/14  2301 History   First MD Initiated Contact with Patient 02/28/14 2304     Chief Complaint  Patient presents with  . Chest Pain     (Consider location/radiation/quality/duration/timing/severity/associated sxs/prior Treatment) HPI Patient presents with right-sided lateral chest pain radiating to the right upper back. She states this started this evening around 5 or 6 PM. Gradual onset. Not associated with any shortness of breath but worse with deep breathing. No cough, fever or chills. Pain is worse with movement. No abdominal pain, nausea or vomiting. Pain is improved since onset. No history of any heavy lifting or trauma. No extensive travel or recent surgeries.  Past Medical History  Diagnosis Date  . Coronary artery disease     a. s/p overlapping DES to RCA 04/2010 (days after neg stress test). b. Cath 06/2010 - patent stents, nonobst dz otherwise; c. 10/2012 Cath: LM nl, LAD 36m, D1/2/3 nl, LCX 20p, OM2 50p, RCA 20% ISR mid, EF 55-60% ->Med Rx.    [name] Hypertension   . HLD (hyperlipidemia)   . Left bundle-branch block   . Tobacco abuse   . Sinus bradycardia     a. In past prevented BB use.  . Monoclonal gammopathy of undetermined significance     a. Previously seen by heme - this is per chart but pt does not recall.  [name] PVC's (premature ventricular contractions)   . Carotid artery disease     a. Hx r carotid bruit - dopplers 2009 <50% bilat;  b. 10/2012 U/S: no hemodynamically signif stenosis.  . Sick sinus syndrome     a. s/p PPM on 11/14/13 with Medtronic Adapta L model ADDRL 1 (serial number [id number]) pacemaker  . History of echocardiogram     a. 2D ECHO: 11/14/2013: EF 60-65%, mild concentric LVH, no RWMAs. G1DD. Mod LA dilation, mild RA dilation, PA pressure 31.   Past Surgical History  Procedure Laterality Date  . Pci      HIGH GRADE LESION IN THE MIDPORTION OF THE RIGHT CORONARY ARTERY WHICH WAS A LARGE ARTERY  . Cardiac  catheterization  05/14/10    SHOWED EVIDENCE OF REVERSIBLE VASOSPASM  . Pacemaker insertion  11/14/13    MDT Adapta L implanted by Dr Rayann Heman for Sick sinus syndrome   Family History  Problem Relation Age of Onset  . Cancer Father   . Heart disease Father    History  Substance Use Topics  . Smoking status: Former Research scientist (life sciences)  . Smokeless tobacco: Not on file     Comment: Smoking for over 45 years  . Alcohol Use: No   OB History    No data available     Review of Systems  Constitutional: Negative for fever and chills.  Respiratory: Negative for cough and shortness of breath.   Cardiovascular: Positive for chest pain. Negative for palpitations and leg swelling.  Gastrointestinal: Negative for nausea, vomiting, abdominal pain and diarrhea.  Musculoskeletal: Positive for back pain. Negative for myalgias, neck pain and neck stiffness.  Skin: Negative for rash and wound.  Neurological: Negative for dizziness, weakness, light-headedness, numbness and headaches.  All other systems reviewed and are negative.     Allergies  Isosorbide; Metoprolol succinate; Zocor; Ezetimibe; Plavix; Sulfa antibiotics; and Sulfonamide derivatives  Home Medications   Prior to Admission medications   Medication Sig Start Date End Date Taking? Authorizing Provider  amLODipine (NORVASC) 10 MG tablet Take 1 tablet (10 mg total) by mouth  daily. 12/25/13  Yes Darlin Coco, MD  Ascorbic Acid (VITAMIN C) 500 MG tablet Take 250 mg by mouth daily.    Yes Historical Provider, MD  aspirin 81 MG tablet Take 81 mg by mouth daily.    Yes Historical Provider, MD  CALCIUM PO Take 1 capsule by mouth daily.   Yes Historical Provider, MD  Cholecalciferol (VITAMIN D PO) Take 1 tablet by mouth daily.   Yes Historical Provider, MD  cyclobenzaprine (FLEXERIL) 5 MG tablet Take 5 mg by mouth 3 (three) times daily as needed for muscle spasms.   Yes Historical Provider, MD  hydrochlorothiazide (MICROZIDE) 12.5 MG capsule Take 12.5  mg by mouth daily.   Yes Historical Provider, MD  meclizine (ANTIVERT) 12.5 MG tablet Take 1 tablet (12.5 mg total) by mouth 3 (three) times daily as needed for dizziness. 12/11/13  Yes Debby Freiberg, MD  nitroGLYCERIN (NITROSTAT) 0.4 MG SL tablet Place 0.4 mg under the tongue every 5 (five) minutes as needed for chest pain (MAX 3 TABLETS).    Yes Historical Provider, MD  rosuvastatin (CRESTOR) 10 MG tablet Take 10 mg by mouth as directed. Monday, Wednesday, and Friday only   Yes Historical Provider, MD  ibuprofen (ADVIL,MOTRIN) 600 MG tablet Take 1 tablet (600 mg total) by mouth every 6 (six) hours as needed. 03/01/14   Julianne Rice, MD   BP 153/68 mmHg  Pulse 60  Temp(Src) 97.5 F (36.4 C) (Oral)  Resp 14  SpO2 96% Physical Exam  Constitutional: She is oriented to person, place, and time. She appears well-developed and well-nourished. No distress.  HENT:  Head: Normocephalic and atraumatic.  Mouth/Throat: Oropharynx is clear and moist.  Eyes: EOM are normal. Pupils are equal, round, and reactive to light.  Neck: Normal range of motion. Neck supple.  Cardiovascular: Normal rate and regular rhythm.   Pulmonary/Chest: Effort normal and breath sounds normal. No respiratory distress. She has no wheezes. She has no rales. She exhibits no tenderness.  Abdominal: Soft. Bowel sounds are normal. She exhibits no distension and no mass. There is no tenderness. There is no rebound and no guarding.  Musculoskeletal: Normal range of motion. She exhibits no edema or tenderness.  No CVA tenderness bilaterally.  Neurological: She is alert and oriented to person, place, and time.  Skin: Skin is warm and dry. No rash noted. No erythema.  Psychiatric: She has a normal mood and affect. Her behavior is normal.  Nursing note and vitals reviewed.   ED Course  Procedures (including critical care time) Labs Review Labs Reviewed  CBC WITH DIFFERENTIAL - Abnormal; Notable for the following:    RBC 3.63  (*)    Hemoglobin 11.4 (*)    HCT 34.1 (*)    All other components within normal limits  COMPREHENSIVE METABOLIC PANEL - Abnormal; Notable for the following:    Glucose, Bld 106 (*)    GFR calc non Af Amer 87 (*)    All other components within normal limits  TROPONIN I  I-STAT TROPOININ, ED    Imaging Review Dg Chest 2 View  03/01/2014   CLINICAL DATA:  Initial evaluation upper back pain right-sided chest pain all day  EXAM: CHEST  2 VIEW  COMPARISON:  12/11/2013  FINDINGS: Mild cardiac enlargement stable. Uncoiling of the aorta. Cardiac pacer. Vascular pattern normal.  Mild scarring or atelectasis in both lung bases similar to prior study. No effusion or pneumothorax.  IMPRESSION: No active cardiopulmonary disease.   Electronically Signed   By:  Skipper Cliche M.D.   On: 03/01/2014 00:31     EKG Interpretation None      Date: 02/28/2014  Rate:61  Rhythm: normal sinus rhythm  QRS Axis: normal  Intervals: QRS prolonged  ST/T Wave abnormalities: nonspecific T wave changes  Conduction Disutrbances:none  Narrative Interpretation:   Old EKG Reviewed: unchanged Electronic atrial paced rhythm which is unchanged compared with previous EKG on 02/10/14.  MDM   Final diagnoses:  Chest pain    Patient's symptoms have improved in the emergency department. Troponin 2 is normal. EKG is unchanged. Given the presentation and location of the pain I doubt this is related to her heart. She is very low risk for PE. She has normal vital signs and heroxygen saturation is in the high 90s on room air.chest x-ray is negative. Suspect musculoskeletal cause for her pain versus pleurisy. She's been advised to follow-up with her primary doctor. Return precautions have been given.    Julianne Rice, MD 03/01/14 [phone]

## 2014-02-28 NOTE — Telephone Encounter (Signed)
Spoke with and she right sided arm, shoulder, and breast pain Pain started about 2 days ago Patient stated she would not be able to get ready or drive herself here for an appointment without her husband being home and he would not be home until after 5:30 Discussed with  Dr. Mare Ferrari and he recommended urgent care or PCP, did not sound cardiac related  Advised patient, verbalized understanding.

## 2014-02-28 NOTE — Telephone Encounter (Signed)
New message     Patient did not want to disclose any information will discuss when nurse calls back.

## 2014-03-01 ENCOUNTER — Emergency Department (HOSPITAL_COMMUNITY): Payer: PRIVATE HEALTH INSURANCE

## 2014-03-01 LAB — I-STAT TROPONIN, ED: TROPONIN I, POC: 0.01 ng/mL (ref 0.00–0.08)

## 2014-03-01 MED ORDER — IBUPROFEN 600 MG PO TABS: 600.0000 mg | ORAL_TABLET | Freq: Four times a day (QID) | ORAL | Status: DC | PRN

## 2014-03-01 MED ORDER — METHOCARBAMOL 500 MG PO TABS
500.0000 mg | ORAL_TABLET | Freq: Once | ORAL | Status: AC
  Administered 2014-03-01: 500 mg via ORAL
  Filled 2014-03-01: qty 1

## 2014-03-01 MED ORDER — TRAMADOL HCL 50 MG PO TABS
50.0000 mg | ORAL_TABLET | Freq: Once | ORAL | Status: AC
  Administered 2014-03-01: 50 mg via ORAL
  Filled 2014-03-01: qty 1

## 2014-03-01 NOTE — ED Notes (Signed)
Patient is alert and orientedx4.  Patient was explained discharge instructions and they understood them with no questions.  The patient's husband, Shevonne Wolf is taking the patient home.

## 2014-03-01 NOTE — Discharge Instructions (Signed)

## 2014-03-04 ENCOUNTER — Ambulatory Visit (INDEPENDENT_AMBULATORY_CARE_PROVIDER_SITE_OTHER): Payer: PRIVATE HEALTH INSURANCE | Admitting: Cardiology

## 2014-03-04 ENCOUNTER — Encounter: Payer: Self-pay | Admitting: Cardiology

## 2014-03-04 VITALS — BP 190/100 | Ht 65.0 in | Wt 136.4 lb

## 2014-03-04 DIAGNOSIS — I495 Sick sinus syndrome: Secondary | ICD-10-CM

## 2014-03-04 DIAGNOSIS — R079 Chest pain, unspecified: Secondary | ICD-10-CM

## 2014-03-04 DIAGNOSIS — I119 Hypertensive heart disease without heart failure: Secondary | ICD-10-CM

## 2014-03-04 DIAGNOSIS — I2583 Coronary atherosclerosis due to lipid rich plaque: Secondary | ICD-10-CM

## 2014-03-04 DIAGNOSIS — I2510 Atherosclerotic heart disease of native coronary artery without angina pectoris: Secondary | ICD-10-CM

## 2014-03-04 DIAGNOSIS — I259 Chronic ischemic heart disease, unspecified: Secondary | ICD-10-CM

## 2014-03-04 MED ORDER — LOSARTAN POTASSIUM 50 MG PO TABS: 50.0000 mg | ORAL_TABLET | Freq: Every day | ORAL | Status: DC

## 2014-03-04 NOTE — Assessment & Plan Note (Signed)
Her blood pressure today is poorly controlled.  On recheck she is still at 190/100.  I checked it myself both arms supine.  She is having ongoing pain around the right scapula and chest and has slept poorly for the past 2 nights and this is contributing to her high blood pressure today.  She is intolerant of beta blockers.  She is already on amlodipine.  We will add losartan 50 mg one daily for blood pressure.

## 2014-03-04 NOTE — Assessment & Plan Note (Signed)
The patient has not had any recurrent chest pain to suggest angina pectoris.

## 2014-03-04 NOTE — Assessment & Plan Note (Signed)
The patient is not having any dizziness or syncope

## 2014-03-04 NOTE — Progress Notes (Signed)
Katherine [name] Date of Birth:  [birth year] [facility] [address] [place], [place]  [address] [phone]        Fax   [phone]   History of Present Illness: This pleasant 68 year old woman is seen for a scheduled followup office visit. She was admitted on 10/16/12 to [facility] with chest pain. She had cardiac catheterization on 10/17/12 showing significant 1 vessel coronary disease with patent stents in the RCA. No evidence of obstructive disease. Left ventricular function was normal. She has a history of known left bundle branch block. She has had known ischemic heart disease. She had her initial PCI on 05/14/10 at which time she was found to have a high-grade lesion in the midportion of the right coronary artery. She had had a normal nuclear stress test 3 days prior to her successful PCI. She has 2 overlapping drug-eluting stents and was on Effient for a year and now is on 81 mg aspirin alone. The patient had started smoking again prior to her recent hospital stay. She is now off cigarettes entirely once again She has a remote history of pneumonia for which she was hospitalized.  The patient had an echocardiogram on 10/18/12 which showed normal LV systolic function, aortic valve sclerosis without stenosis, and moderate TR. The patient also underwent carotid Dopplers on 10/18/12 which showed no obstruction.  On 11/14/13 the patient presented with marked bradycardia with transient AV block.  She had an underlying left bundle-branch block.  She underwent Medtronic dual pacer insertion by Dr. Rayann Heman. On 02/28/14 the patient went to the emergency room because of right sided chest pain.  EKG showed atrial paced rhythm and no acute changes.  Chest x-ray was unremarkable.  It was felt that she was having musculoskeletal chest wall pain.  She was prescribed Flexeril by the emergency room staff.    Current Outpatient Prescriptions  Medication Sig Dispense Refill  .  amLODipine (NORVASC) 10 MG tablet Take 1 tablet (10 mg total) by mouth daily. 30 tablet 3  . Ascorbic Acid (VITAMIN C) 500 MG tablet Take 250 mg by mouth daily.     [name] aspirin 81 MG tablet Take 81 mg by mouth daily.     [name] CALCIUM PO Take 1 capsule by mouth daily.    . Cholecalciferol (VITAMIN D PO) Take 1 tablet by mouth daily.    . cyclobenzaprine (FLEXERIL) 5 MG tablet Take 5 mg by mouth 3 (three) times daily as needed for muscle spasms.    . hydrochlorothiazide (MICROZIDE) 12.5 MG capsule Take 12.5 mg by mouth daily.    [name] ibuprofen (ADVIL,MOTRIN) 600 MG tablet Take 1 tablet (600 mg total) by mouth every 6 (six) hours as needed. 30 tablet 0  . losartan (COZAAR) 50 MG tablet Take 1 tablet (50 mg total) by mouth daily. 30 tablet 1  . meclizine (ANTIVERT) 12.5 MG tablet Take 1 tablet (12.5 mg total) by mouth 3 (three) times daily as needed for dizziness. 30 tablet 0  . nitroGLYCERIN (NITROSTAT) 0.4 MG SL tablet Place 0.4 mg under the tongue every 5 (five) minutes as needed for chest pain (MAX 3 TABLETS).     . rosuvastatin (CRESTOR) 10 MG tablet Take 10 mg by mouth as directed. Monday, Wednesday, and Friday only     No current facility-administered medications for this visit.    Allergies  Allergen Reactions  . Isosorbide Swelling    SWELLING OF THE TONGUE - pt states she received  this after PNA ? But has been able to tolerate SL nitroglycerin without adverse reaction.  . Metoprolol Succinate [Metoprolol] Other (See Comments)    Dizziness, diarrhea, tingling in fingers  . Zocor [Simvastatin] Other (See Comments)    Leg pain - does not tolerate high doses of statins  . Ezetimibe Other (See Comments)    unknown  . Plavix [Clopidogrel Bisulfate] Other (See Comments)    Prior P2Y12 showing 267 (poor Plavix responsiveness)  . Sulfa Antibiotics Other (See Comments)    Sulfa drugs as a child. UNKNOWN REACTION  . Sulfonamide Derivatives Other (See Comments)    Sulfa drugs as a child. UNKNOWN  REACTION    Patient Active Problem List   Diagnosis Date Noted  . Anginal pain     Priority: Medium  . Nosebleed     Priority: Medium  . History of hypercholesterolemia     Priority: Medium  . Tobacco abuse     Priority: Medium  . History of echocardiogram   . Sick sinus syndrome   . Carotid artery disease   . Monoclonal gammopathy of undetermined significance   . HLD (hyperlipidemia)   . Hypertensive cardiovascular disease   . Back pain 11/13/2013  . Angina at rest 11/13/2013  . Symptomatic sinus bradycardia 11/13/2013  . Aortic valve sclerosis 07/10/2013  . Chest pain 10/18/2012  . Sinus bradycardia 10/18/2012  . Dizzy spells 10/18/2012  . PVC's (premature ventricular contractions) 09/25/2012  . Coronary artery disease, hx of stents to RCA   . 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 Left bundle-branch block     History  Smoking status  . Former Smoker  Smokeless tobacco  . Not on file    Comment: Smoking for over 45 years    History  Alcohol Use No    Family History  Problem Relation Age of Onset  . Cancer Father   . Heart disease Father     Review of Systems: Constitutional: no fever chills diaphoresis or fatigue or change in weight.  Head and neck: no hearing loss, no epistaxis, no photophobia or visual disturbance. Respiratory: No cough, shortness of breath or wheezing. Cardiovascular: No chest pain peripheral edema, palpitations. Gastrointestinal: No abdominal distention, no abdominal pain, no change in bowel habits hematochezia or melena. Genitourinary: No dysuria, no frequency, no urgency, no nocturia. Musculoskeletal:No arthralgias, no back pain, no gait disturbance or myalgias. Neurological: No dizziness, no headaches, no numbness, no seizures, no syncope, no weakness, no tremors. Hematologic: No lymphadenopathy, no easy bruising. Psychiatric: No confusion, no hallucinations, no sleep disturbance.    Physical Exam: Filed  Vitals:   03/04/14 1511  BP: 190/100   the general appearance reveals a well-developed well-nourished woman in no distress.The head and neck exam reveals pupils equal and reactive.  Extraocular movements are full.  There is no scleral icterus.  The mouth and pharynx are normal.  The neck is supple.  The carotids reveal no bruits.  The jugular venous pressure is normal.  The  thyroid is not enlarged.  There is no lymphadenopathy.  The chest is clear to percussion and auscultation.  There are no rales or rhonchi.  Expansion of the chest is symmetrical.  The precordium is quiet.  The first heart sound is normal.  The second heart sound is physiologically split.  There is grade 1/6 systolic ejection murmur at the base.  There is no abnormal lift or heave.  The abdomen is soft and nontender.  The bowel sounds  are normal.  The liver and spleen are not enlarged.  There are no abdominal masses.  There are no abdominal bruits.  Extremities reveal good pedal pulses.  There is no phlebitis or edema.  There is no cyanosis or clubbing.  Strength is normal and symmetrical in all extremities.  There is no lateralizing weakness.  There are no sensory deficits.  The skin is warm and dry.  There is no rash.     Assessment / Plan: 1.  Ischemic heart disease status post PCI of right coronary artery on 05/14/10 2. aortic valve sclerosis without stenosis and moderate TR 3. Hypercholesterolemia 4. essential hypertension without heart failure 5. Musculoskeletal chest wall pain 6.  Memory problems  Disposition: Start losartan 50 mg one daily for hypertension.  Recheck in 2 weeks for office visit and get a BMET at that time.  she has not been taking her Crestor on a regular basis because of myalgia even on intermittent dosing.for her chest wall pain she will continue with ibuprofen and with Tylenol and she will also try heat packs.

## 2014-03-04 NOTE — Patient Instructions (Signed)
Your physician has recommended you make the following change in your medication:  1) START Losartan 50 mg daily  Your physician recommends that you schedule a follow-up appointment in: 2 weeks with Dr. Mare Ferrari or NP/PA. Your physician recommends that you return for lab work in: 2 weeks for [employer]

## 2014-03-05 ENCOUNTER — Emergency Department (HOSPITAL_COMMUNITY): Payer: PRIVATE HEALTH INSURANCE

## 2014-03-05 ENCOUNTER — Observation Stay (HOSPITAL_COMMUNITY)
Admission: EM | Admit: 2014-03-05 | Discharge: 2014-03-06 | Disposition: A | Discharge status: Home / Self Care | Payer: PRIVATE HEALTH INSURANCE | Attending: Internal Medicine | Admitting: Internal Medicine

## 2014-03-05 ENCOUNTER — Encounter (HOSPITAL_COMMUNITY): Payer: Self-pay | Admitting: *Deleted

## 2014-03-05 DIAGNOSIS — I495 Sick sinus syndrome: Secondary | ICD-10-CM | POA: Diagnosis not present

## 2014-03-05 DIAGNOSIS — R791 Abnormal coagulation profile: Secondary | ICD-10-CM | POA: Diagnosis not present

## 2014-03-05 DIAGNOSIS — Z7982 Long term (current) use of aspirin: Secondary | ICD-10-CM | POA: Insufficient documentation

## 2014-03-05 DIAGNOSIS — I10 Essential (primary) hypertension: Secondary | ICD-10-CM | POA: Diagnosis not present

## 2014-03-05 DIAGNOSIS — R1011 Right upper quadrant pain: Secondary | ICD-10-CM | POA: Diagnosis not present

## 2014-03-05 DIAGNOSIS — M25511 Pain in right shoulder: Secondary | ICD-10-CM | POA: Diagnosis not present

## 2014-03-05 DIAGNOSIS — R079 Chest pain, unspecified: Secondary | ICD-10-CM | POA: Diagnosis not present

## 2014-03-05 DIAGNOSIS — I2510 Atherosclerotic heart disease of native coronary artery without angina pectoris: Secondary | ICD-10-CM | POA: Diagnosis present

## 2014-03-05 DIAGNOSIS — I25118 Atherosclerotic heart disease of native coronary artery with other forms of angina pectoris: Secondary | ICD-10-CM

## 2014-03-05 DIAGNOSIS — D472 Monoclonal gammopathy: Secondary | ICD-10-CM | POA: Diagnosis present

## 2014-03-05 DIAGNOSIS — I493 Ventricular premature depolarization: Secondary | ICD-10-CM | POA: Diagnosis not present

## 2014-03-05 DIAGNOSIS — E785 Hyperlipidemia, unspecified: Secondary | ICD-10-CM | POA: Diagnosis present

## 2014-03-05 DIAGNOSIS — I447 Left bundle-branch block, unspecified: Secondary | ICD-10-CM | POA: Insufficient documentation

## 2014-03-05 DIAGNOSIS — R7989 Other specified abnormal findings of blood chemistry: Secondary | ICD-10-CM

## 2014-03-05 DIAGNOSIS — Z9222 Personal history of monoclonal drug therapy: Secondary | ICD-10-CM | POA: Insufficient documentation

## 2014-03-05 DIAGNOSIS — Z87891 Personal history of nicotine dependence: Secondary | ICD-10-CM | POA: Insufficient documentation

## 2014-03-05 DIAGNOSIS — I119 Hypertensive heart disease without heart failure: Secondary | ICD-10-CM

## 2014-03-05 DIAGNOSIS — I779 Disorder of arteries and arterioles, unspecified: Secondary | ICD-10-CM | POA: Diagnosis not present

## 2014-03-05 DIAGNOSIS — M549 Dorsalgia, unspecified: Secondary | ICD-10-CM | POA: Diagnosis present

## 2014-03-05 DIAGNOSIS — Z79899 Other long term (current) drug therapy: Secondary | ICD-10-CM | POA: Diagnosis not present

## 2014-03-05 DIAGNOSIS — R0789 Other chest pain: Secondary | ICD-10-CM

## 2014-03-05 LAB — COMPREHENSIVE METABOLIC PANEL
ALBUMIN: 3.4 g/dL — AB (ref 3.5–5.2)
ALK PHOS: 66 U/L (ref 39–117)
ALT: 10 U/L (ref 0–35)
ANION GAP: 16 — AB (ref 5–15)
AST: 18 U/L (ref 0–37)
BUN: 13 mg/dL (ref 6–23)
CO2: 21 mEq/L (ref 19–32)
Calcium: 9 mg/dL (ref 8.4–10.5)
Chloride: 104 mEq/L (ref 96–112)
Creatinine, Ser: 0.95 mg/dL (ref 0.50–1.10)
GFR calc Af Amer: 70 mL/min — ABNORMAL LOW (ref >90)
GFR calc non Af Amer: 61 mL/min — ABNORMAL LOW (ref >90)
Glucose, Bld: 98 mg/dL (ref 70–99)
Potassium: 3.7 mEq/L (ref 3.7–5.3)
SODIUM: 141 meq/L (ref 137–147)
TOTAL PROTEIN: 7.1 g/dL (ref 6.0–8.3)
Total Bilirubin: 0.2 mg/dL — ABNORMAL LOW (ref 0.3–1.2)

## 2014-03-05 LAB — TROPONIN I
Troponin I: <0.3 ng/mL (ref <0.30)
Troponin I: <0.3 ng/mL (ref <0.30)

## 2014-03-05 LAB — CBC
HCT: 36.2 % (ref 36.0–46.0)
Hemoglobin: 11.6 g/dL — ABNORMAL LOW (ref 12.0–15.0)
MCH: 29.6 pg (ref 26.0–34.0)
MCHC: 32 g/dL (ref 30.0–36.0)
MCV: 92.3 fL (ref 78.0–100.0)
PLATELETS: 348 10*3/uL (ref 150–400)
RBC: 3.92 MIL/uL (ref 3.87–5.11)
RDW: 13.9 % (ref 11.5–15.5)
WBC: 5.2 10*3/uL (ref 4.0–10.5)

## 2014-03-05 LAB — URINALYSIS, ROUTINE W REFLEX MICROSCOPIC
Bilirubin Urine: NEGATIVE
GLUCOSE, UA: NEGATIVE mg/dL
Hgb urine dipstick: NEGATIVE
Ketones, ur: NEGATIVE mg/dL
Leukocytes, UA: NEGATIVE
Nitrite: NEGATIVE
Protein, ur: NEGATIVE mg/dL
SPECIFIC GRAVITY, URINE: 1.018 (ref 1.005–1.030)
UROBILINOGEN UA: 0.2 mg/dL (ref 0.0–1.0)
pH: 6.5 (ref 5.0–8.0)

## 2014-03-05 LAB — D-DIMER, QUANTITATIVE: D-Dimer, Quant: 1.51 ug/mL-FEU — ABNORMAL HIGH (ref 0.00–0.48)

## 2014-03-05 LAB — LIPASE, BLOOD: LIPASE: 22 U/L (ref 11–59)

## 2014-03-05 LAB — PRO B NATRIURETIC PEPTIDE: Pro B Natriuretic peptide (BNP): 872.5 pg/mL — ABNORMAL HIGH (ref 0–125)

## 2014-03-05 MED ORDER — HYDROMORPHONE HCL 1 MG/ML IJ SOLN
0.5000 mg | INTRAMUSCULAR | Status: DC | PRN
  Administered 2014-03-05: 0.5 mg via INTRAVENOUS
  Administered 2014-03-06: 1 mg via INTRAVENOUS
  Administered 2014-03-06: 0.5 mg via INTRAVENOUS
  Filled 2014-03-05 (×3): qty 1

## 2014-03-05 MED ORDER — ALUM & MAG HYDROXIDE-SIMETH 200-200-20 MG/5ML PO SUSP: 30.0000 mL | Freq: Four times a day (QID) | ORAL | Status: DC | PRN

## 2014-03-05 MED ORDER — NITROGLYCERIN 0.4 MG SL SUBL: 0.4000 mg | SUBLINGUAL_TABLET | SUBLINGUAL | Status: DC | PRN

## 2014-03-05 MED ORDER — HYDROCHLOROTHIAZIDE 12.5 MG PO CAPS
12.5000 mg | ORAL_CAPSULE | Freq: Every day | ORAL | Status: DC
  Administered 2014-03-06: 12.5 mg via ORAL
  Filled 2014-03-05: qty 1

## 2014-03-05 MED ORDER — SODIUM CHLORIDE 0.9 % IJ SOLN
3.0000 mL | Freq: Two times a day (BID) | INTRAMUSCULAR | Status: DC
  Administered 2014-03-05 – 2014-03-06 (×2): 3 mL via INTRAVENOUS

## 2014-03-05 MED ORDER — IOHEXOL 350 MG/ML SOLN
100.0000 mL | Freq: Once | INTRAVENOUS | Status: AC | PRN
  Administered 2014-03-05: 100 mL via INTRAVENOUS

## 2014-03-05 MED ORDER — SODIUM CHLORIDE 0.9 % IV BOLUS (SEPSIS)
500.0000 mL | Freq: Once | INTRAVENOUS | Status: AC
  Administered 2014-03-05: 500 mL via INTRAVENOUS

## 2014-03-05 MED ORDER — OXYCODONE HCL 5 MG PO TABS
5.0000 mg | ORAL_TABLET | ORAL | Status: DC | PRN
  Administered 2014-03-06: 5 mg via ORAL
  Filled 2014-03-05: qty 1

## 2014-03-05 MED ORDER — VITAMIN C 500 MG PO TABS
250.0000 mg | ORAL_TABLET | Freq: Every day | ORAL | Status: DC
  Administered 2014-03-06: 250 mg via ORAL
  Filled 2014-03-05: qty 1

## 2014-03-05 MED ORDER — LOSARTAN POTASSIUM 50 MG PO TABS
50.0000 mg | ORAL_TABLET | Freq: Every day | ORAL | Status: DC
Start: 2014-03-06 — End: 2014-03-06
  Administered 2014-03-06: 50 mg via ORAL
  Filled 2014-03-05: qty 1

## 2014-03-05 MED ORDER — SODIUM CHLORIDE 0.9 % IJ SOLN
3.0000 mL | Freq: Two times a day (BID) | INTRAMUSCULAR | Status: DC
  Administered 2014-03-06: 3 mL via INTRAVENOUS

## 2014-03-05 MED ORDER — SODIUM CHLORIDE 0.9 % IV SOLN: 250.0000 mL | INTRAVENOUS | Status: DC | PRN

## 2014-03-05 MED ORDER — ACETAMINOPHEN 650 MG RE SUPP: 650.0000 mg | Freq: Four times a day (QID) | RECTAL | Status: DC | PRN

## 2014-03-05 MED ORDER — MORPHINE SULFATE 2 MG/ML IJ SOLN
2.0000 mg | Freq: Once | INTRAMUSCULAR | Status: AC
  Administered 2014-03-05: 2 mg via INTRAVENOUS
  Filled 2014-03-05: qty 1

## 2014-03-05 MED ORDER — ENOXAPARIN SODIUM 40 MG/0.4ML ~~LOC~~ SOLN
40.0000 mg | Freq: Every day | SUBCUTANEOUS | Status: DC
  Administered 2014-03-06: 40 mg via SUBCUTANEOUS
  Filled 2014-03-05: qty 0.4

## 2014-03-05 MED ORDER — AMLODIPINE BESYLATE 10 MG PO TABS
10.0000 mg | ORAL_TABLET | Freq: Every day | ORAL | Status: DC
  Administered 2014-03-06: 10 mg via ORAL
  Filled 2014-03-05: qty 1

## 2014-03-05 MED ORDER — ONDANSETRON HCL 4 MG/2ML IJ SOLN
4.0000 mg | Freq: Four times a day (QID) | INTRAMUSCULAR | Status: DC | PRN
  Administered 2014-03-06: 4 mg via INTRAVENOUS

## 2014-03-05 MED ORDER — VITAMIN D 1000 UNITS PO TABS
1000.0000 [IU] | ORAL_TABLET | Freq: Every day | ORAL | Status: DC
  Administered 2014-03-06: 1000 [IU] via ORAL
  Filled 2014-03-05: qty 1

## 2014-03-05 MED ORDER — CALCIUM CARBONATE 1250 (500 CA) MG PO TABS
1250.0000 mg | ORAL_TABLET | Freq: Every day | ORAL | Status: DC
  Administered 2014-03-06: 1250 mg via ORAL
  Filled 2014-03-05: qty 3

## 2014-03-05 MED ORDER — ONDANSETRON HCL 4 MG PO TABS: 4.0000 mg | ORAL_TABLET | Freq: Four times a day (QID) | ORAL | Status: DC | PRN

## 2014-03-05 MED ORDER — ASPIRIN 325 MG PO TABS
325.0000 mg | ORAL_TABLET | Freq: Every day | ORAL | Status: DC
  Administered 2014-03-06: 325 mg via ORAL
  Filled 2014-03-05: qty 1

## 2014-03-05 MED ORDER — ACETAMINOPHEN 325 MG PO TABS: 650.0000 mg | ORAL_TABLET | Freq: Four times a day (QID) | ORAL | Status: DC | PRN

## 2014-03-05 MED ORDER — SODIUM CHLORIDE 0.9 % IJ SOLN: 3.0000 mL | INTRAMUSCULAR | Status: DC | PRN

## 2014-03-05 NOTE — ED Notes (Signed)
Pt reports being seen here on 11/13 for same. Pt having right side back pain, right shoulder pain and wraps around to right breast. Hx of cardiac problems that started with back pain but reports everything was negative when here on 11/13, pain is  Not relieved with otc pain meds and reports pain is intermittent and sharp. ekg done at triage, airway intact.

## 2014-03-05 NOTE — ED Notes (Signed)
Pt c/o L sided chest pain, radiating around L side and into shoulder. Pt recently seen for same, given Flexeril without relief. Pt had follow up appointment with Dr.Brackbill and prescribed antihypertensives for bp 412 systolically. Per husband, pt's pain started on Thursday and has been constant since. Pain is more severe at times. Pt has left sided pacemaker placed in July for initial complaints of severe back pain. Pt also c/o tenderness to RUQ.

## 2014-03-05 NOTE — ED Provider Notes (Signed)
CSN: [id number]     Arrival date & time 03/05/14  1419 History   First MD Initiated Contact with Patient 03/05/14 1506     Chief Complaint  Patient presents with  . Back Pain     (Consider location/radiation/quality/duration/timing/severity/associated sxs/prior Treatment) Patient is a 68 y.o. female presenting with back pain. The history is provided by the patient. No language interpreter was used.  Back Pain Associated symptoms: chest pain   Associated symptoms: no abdominal pain, no fever, no headaches, no numbness and no weakness   DYMPHNA WADLEY is a 68 y/o F with PMHx of HTN, HLD, LBBB, SSS with PM placement on 11/14/2013 by Dr. Rayann Heman, CAD with cardiac catheterization performed in 2012 that identified stents in the RCA x 2 presenting to the ED with right chest and shoulder pain that has been ongoing since 02/27/2014. Patient reported that she was seen and assessed in the ED and diagnosed with muscle pain. Patient reported that she was prescribed Ibuprofen that she has been taking with minimal relief. Patient reported that she was seen and assessed by her Cardiologist, Dr. Mare Ferrari yesterday and stated that he recommended to continue with the medications and heat application. Patient reported that the right sided chest pain has gotten progressively worse. Patient reported that the pain is localized to the right shoulder with radiation to the right side of the chest and right breast. Reported that at times she has intermittent pins and needle sensation to the right arm. Patient reported that pain increases with palpation and motion. Patient reported that the medications have not been helping. Reported that this pain is similar to the pain she experienced before she was told that she needed to get a PM placed. Stated that she still has her gallbladder. Denied travels, leg swelling, shortness of breath, difficulty breathing, weakness, loss of sensation, fever, chills, neck pain, neck stiffness,  dizziness, headache, blurred vision, sudden loss of vision, nausea, vomiting, diarrhea, abdominal pain. Reported that she stopped smoking a couple of days ago.  PCP Dr. Mare Ferrari   Past Medical History  Diagnosis Date  . Coronary artery disease     a. s/p overlapping DES to RCA 04/2010 (days after neg stress test). b. Cath 06/2010 - patent stents, nonobst dz otherwise; c. 10/2012 Cath: LM nl, LAD 22m, D1/2/3 nl, LCX 20p, OM2 50p, RCA 20% ISR mid, EF 55-60% ->Med Rx.    [name] Hypertension   . HLD (hyperlipidemia)   . Left bundle-branch block   . Tobacco abuse   . Sinus bradycardia     a. In past prevented BB use.  . Monoclonal gammopathy of undetermined significance     a. Previously seen by heme - this is per chart but pt does not recall.  [name] PVC's (premature ventricular contractions)   . Carotid artery disease     a. Hx r carotid bruit - dopplers 2009 <50% bilat;  b. 10/2012 U/S: no hemodynamically signif stenosis.  . Sick sinus syndrome     a. s/p PPM on 11/14/13 with [employer]onic Adapta L model ADDRL 1 (serial number [id number]) pacemaker  . History of echocardiogram     a. 2D ECHO: 11/14/2013: EF 60-65%, mild concentric LVH, no RWMAs. G1DD. Mod LA dilation, mild RA dilation, PA pressure 31.   Past Surgical History  Procedure Laterality Date  . Pci      HIGH GRADE LESION IN THE MIDPORTION OF THE RIGHT CORONARY ARTERY WHICH WAS A LARGE ARTERY  . Cardiac catheterization  05/14/10  SHOWED EVIDENCE OF REVERSIBLE VASOSPASM  . Pacemaker insertion  11/14/13    MDT Adapta L implanted by Dr Rayann Heman for Sick sinus syndrome   Family History  Problem Relation Age of Onset  . Cancer Father   . Heart disease Father    History  Substance Use Topics  . Smoking status: Former Research scientist (life sciences)  . Smokeless tobacco: Not on file     Comment: Smoking for over 45 years  . Alcohol Use: No   OB History    No data available     Review of Systems  Constitutional: Negative for fever and chills.  Eyes: Negative  for visual disturbance.  Respiratory: Positive for shortness of breath. Negative for chest tightness.   Cardiovascular: Positive for chest pain.  Gastrointestinal: Negative for nausea, vomiting and abdominal pain.  Musculoskeletal: Positive for back pain. Negative for neck pain and neck stiffness.  Neurological: Negative for dizziness, weakness, numbness and headaches.      Allergies  Isosorbide; Metoprolol succinate; Zocor; Ezetimibe; Plavix; Sulfa antibiotics; and Sulfonamide derivatives  Home Medications   Prior to Admission medications   Medication Sig Start Date End Date Taking? Authorizing Provider  amLODipine (NORVASC) 10 MG tablet Take 1 tablet (10 mg total) by mouth daily. 12/25/13  Yes Darlin Coco, MD  Ascorbic Acid (VITAMIN C) 500 MG tablet Take 250 mg by mouth daily.    Yes Historical Provider, MD  aspirin 81 MG tablet Take 81 mg by mouth daily.    Yes Historical Provider, MD  CALCIUM PO Take 1 capsule by mouth daily.   Yes Historical Provider, MD  Cholecalciferol (VITAMIN D PO) Take 1 tablet by mouth daily.   Yes Historical Provider, MD  cyclobenzaprine (FLEXERIL) 5 MG tablet Take 5 mg by mouth 3 (three) times daily as needed for muscle spasms.   Yes Historical Provider, MD  hydrochlorothiazide (MICROZIDE) 12.5 MG capsule Take 12.5 mg by mouth daily.   Yes Historical Provider, MD  ibuprofen (ADVIL,MOTRIN) 600 MG tablet Take 1 tablet (600 mg total) by mouth every 6 (six) hours as needed. 03/01/14  Yes Julianne Rice, MD  losartan (COZAAR) 50 MG tablet Take 1 tablet (50 mg total) by mouth daily. 03/04/14  Yes Darlin Coco, MD  meclizine (ANTIVERT) 12.5 MG tablet Take 1 tablet (12.5 mg total) by mouth 3 (three) times daily as needed for dizziness. 12/11/13  Yes Debby Freiberg, MD  nitroGLYCERIN (NITROSTAT) 0.4 MG SL tablet Place 0.4 mg under the tongue every 5 (five) minutes as needed for chest pain (MAX 3 TABLETS).    Yes Historical Provider, MD   BP 153/63 mmHg  Pulse  60  Temp(Src) 97.6 F (36.4 C) (Oral)  Resp 18  SpO2 97% Physical Exam  Constitutional: She is oriented to person, place, and time. She appears well-developed and well-nourished. No distress.  HENT:  Head: Normocephalic and atraumatic.  Mouth/Throat: Oropharynx is clear and moist. No oropharyngeal exudate.  Eyes: Conjunctivae and EOM are normal. Pupils are equal, round, and reactive to light. Right eye exhibits no discharge. Left eye exhibits no discharge.  Neck: Normal range of motion. Neck supple. No tracheal deviation present.  Negative neck stiffness Negative nuchal rigidity  Negative cervical lymphadenopathy  Negative meningeal signs  Negative pain upon palpaton to the musculature of the neck   Cardiovascular: Normal rate, regular rhythm and normal heart sounds.  Exam reveals no friction rub.   No murmur heard. Pulses:      Radial pulses are 2+ on the right side, and  2+ on the left side.       Dorsalis pedis pulses are 2+ on the right side, and 2+ on the left side.  Cap refill < 3 seconds Negative swelling or pitting edema noted to the lower extremities bilaterally   Pulmonary/Chest: Effort normal and breath sounds normal. No respiratory distress. She has no wheezes. She has no rales. She exhibits tenderness.    Patient is able to speak in full sentences without difficulty Negative use of accessory muscles Negative stridor  Negative deformities noted to the chest wall. Negative tenting of the clavicles - negative pain upon palpation. Negative crepitus upon palpation to the chest wall.    Positive discomfort upon palpation to the right side of the chest wall.   Breast exam: Negative swelling, erythema, inflammation, lesions, sores, deformities, peu d'orange, drainage, inverted nipple, mastitis. Negative palpation of cysts or masses. Negative lymphadenopathy noted on exam. Negative asymmetry to the breasts.   Abdominal: Soft. Normal appearance and bowel sounds are normal. She  exhibits no distension. There is tenderness in the right upper quadrant. There is no rebound and no guarding.  Negative abdominal distension  BS normoactive in all 4 quadrants Abdomen soft upon palpation  Mild discomfort upon palpation to the right upper quadrant Negative peritoneal signs Negative CVA tenderness bilaterally   Musculoskeletal: Normal range of motion. She exhibits tenderness.       Right shoulder: She exhibits tenderness and bony tenderness. She exhibits normal range of motion, no swelling, no effusion, no deformity and no laceration.       Arms: Negative deformities noted to the right shoulder. Negative sunken in appearance. Negative lesions or sores noted. Full ROM noted to RUE without difficulty or pain noted. Discomfort upon palpation to the right scapula.   Lymphadenopathy:    She has no cervical adenopathy.  Neurological: She is alert and oriented to person, place, and time. No cranial nerve deficit. She exhibits normal muscle tone. Coordination normal.  Cranial nerves III-XII grossly intact Strength 5+/5+ to upper extremities bilaterally with resistance applied, equal distribution noted Equal grip strength bilaterally  Strength intact to MCP, PIP, DIP joints of right hand  Skin: Skin is warm and dry. No rash noted. She is not diaphoretic. No erythema.  Negative lesions or sores noted to the skin - doubt zoster.   Psychiatric: She has a normal mood and affect. Her behavior is normal. Thought content normal.  Nursing note and vitals reviewed.   ED Course  Procedures (including critical care time)  Results for orders placed or performed during the hospital encounter of 03/05/14  CBC  Result Value Ref Range   WBC 5.2 4.0 - 10.5 K/uL   RBC 3.92 3.87 - 5.11 MIL/uL   Hemoglobin 11.6 (L) 12.0 - 15.0 g/dL   HCT 36.2 36.0 - 46.0 %   MCV 92.3 78.0 - 100.0 fL   MCH 29.6 26.0 - 34.0 pg   MCHC 32.0 30.0 - 36.0 g/dL   RDW 13.9 11.5 - 15.5 %   Platelets 348 150 - 400 K/uL    Comprehensive metabolic panel  Result Value Ref Range   Sodium 141 137 - 147 mEq/L   Potassium 3.7 3.7 - 5.3 mEq/L   Chloride 104 96 - 112 mEq/L   CO2 21 19 - 32 mEq/L   Glucose, Bld 98 70 - 99 mg/dL   BUN 13 6 - 23 mg/dL   Creatinine, Ser 0.95 0.50 - 1.10 mg/dL   Calcium 9.0 8.4 - 10.5 mg/dL  Total Protein 7.1 6.0 - 8.3 g/dL   Albumin 3.4 (L) 3.5 - 5.2 g/dL   AST 18 0 - 37 U/L   ALT 10 0 - 35 U/L   Alkaline Phosphatase 66 39 - 117 U/L   Total Bilirubin 0.2 (L) 0.3 - 1.2 mg/dL   GFR calc non Af Amer 61 (L) >90 mL/min   GFR calc Af Amer 70 (L) >90 mL/min   Anion gap 16 (H) 5 - 15  Troponin I  Result Value Ref Range   Troponin I <0.30 <0.30 ng/mL  Urinalysis, Routine w reflex microscopic  Result Value Ref Range   Color, Urine YELLOW YELLOW   APPearance CLEAR CLEAR   Specific Gravity, Urine 1.018 1.005 - 1.030   pH 6.5 5.0 - 8.0   Glucose, UA NEGATIVE NEGATIVE mg/dL   Hgb urine dipstick NEGATIVE NEGATIVE   Bilirubin Urine NEGATIVE NEGATIVE   Ketones, ur NEGATIVE NEGATIVE mg/dL   Protein, ur NEGATIVE NEGATIVE mg/dL   Urobilinogen, UA 0.2 0.0 - 1.0 mg/dL   Nitrite NEGATIVE NEGATIVE   Leukocytes, UA NEGATIVE NEGATIVE  Lipase, blood  Result Value Ref Range   Lipase 22 11 - 59 U/L  Pro b natriuretic peptide (BNP)  Result Value Ref Range   Pro B Natriuretic peptide (BNP) 872.5 (H) 0 - 125 pg/mL  D-dimer, quantitative  Result Value Ref Range   D-Dimer, Quant 1.51 (H) 0.00 - 0.48 ug/mL-FEU  Troponin I  Result Value Ref Range   Troponin I <0.30 <0.30 ng/mL    Labs Review Labs Reviewed  CBC - Abnormal; Notable for the following:    Hemoglobin 11.6 (*)    All other components within normal limits  COMPREHENSIVE METABOLIC PANEL - Abnormal; Notable for the following:    Albumin 3.4 (*)    Total Bilirubin 0.2 (*)    GFR calc non Af Amer 61 (*)    GFR calc Af Amer 70 (*)    Anion gap 16 (*)    All other components within normal limits  PRO B NATRIURETIC PEPTIDE -  Abnormal; Notable for the following:    Pro B Natriuretic peptide (BNP) 872.5 (*)    All other components within normal limits  D-DIMER, QUANTITATIVE - Abnormal; Notable for the following:    D-Dimer, Quant 1.51 (*)    All other components within normal limits  TROPONIN I  URINALYSIS, ROUTINE W REFLEX MICROSCOPIC  LIPASE, BLOOD  TROPONIN I    Imaging Review Dg Chest 2 View  03/05/2014   CLINICAL DATA:  Initial encounter for acute right chest and site pain for 2-3 days, posterior right shoulder pain also, personal history of hypertension  EXAM: CHEST  2 VIEW  COMPARISON:  03/01/2014  FINDINGS: Stable mild cardiac enlargement. Vascular pattern normal. Cardiac pacer in unchanged position.  Stable minimal interstitial changes at both lung bases suggesting mild atelectasis or scarring. No consolidation or effusion.  IMPRESSION: No acute cardiopulmonary process.   Electronically Signed   By: Skipper Cliche M.D.   On: 03/05/2014 17:00   Dg Shoulder Right  03/05/2014   CLINICAL DATA:  Right chest pain for 2-3 days. Posterior shoulder pain as well.  EXAM: RIGHT SHOULDER - 2+ VIEW  COMPARISON:  03/05/2014 Chest radiograph  FINDINGS: There is no evidence of fracture or dislocation. There is no evidence of arthropathy or other focal bone abnormality. Soft tissues are unremarkable.  IMPRESSION: Negative.   Electronically Signed   By: Sherryl Barters M.D.   On: 03/05/2014  17:00   Ct Angio Chest Pe W/cm &/or Wo Cm  03/05/2014   CLINICAL DATA:  Chest pain, mid back pain, shortness of breath with exertion, elevated D-dimer, recent pacemaker placement, history hypertension, smoking, coronary artery disease post stenting, arrhythmia  EXAM: CT ANGIOGRAPHY CHEST WITH CONTRAST  TECHNIQUE: Multidetector CT imaging of the chest was performed using the standard protocol during bolus administration of intravenous contrast. Multiplanar CT image reconstructions and MIPs were obtained to evaluate the vascular anatomy.   CONTRAST:  177mL OMNIPAQUE IOHEXOL 350 MG/ML SOLN IV  COMPARISON:  06/13/2008  FINDINGS: Beam hardening artifacts from pacemaker generator at upper LEFT anterior chest.  Scattered atherosclerotic calcifications aorta and coronary arteries.  No aortic aneurysm or dissection.  Pulmonary arteries well opacified and patent.  No evidence of pulmonary embolism.  No thoracic adenopathy.  Pacemaker leads in RIGHT atrium and RIGHT ventricle.  Cyst within RIGHT lobe liver 18 x 13 mm.  Cyst at medial aspect upper pole LEFT kidney incompletely seen, 2.6 x 2.5 cm image 263.  Remaining visualized upper abdomen unremarkable  Emphysematous changes with minimal dependent atelectasis in the posterior lower lobes.  Minimal subpleural likely chronic interstitial disease BILATERAL upper lobes, stable on LEFT.  Large area of confluent airspace consolidation involving the RIGHT upper lobe on the previous exam has cleared.  No acute infiltrate, pleural effusion, pneumothorax, or definite mass/nodule.  No acute osseous findings.  Review of the MIP images confirms the above findings.  IMPRESSION: No evidence of pulmonary embolism.  Minimal dependent atelectasis at lower lobes.  Hepatic and LEFT renal cysts.  No definite acute intra thoracic abnormalities.   Electronically Signed   By: Lavonia Dana M.D.   On: 03/05/2014 18:54   US Abdomen Limited Ruq  03/05/2014   CLINICAL DATA:  RIGHT shoulder pain, abdominal pain, history hypertension, coronary artery disease, pacemaker  EXAM: US ABDOMEN LIMITED - RIGHT UPPER QUADRANT  COMPARISON:  None  FINDINGS: Gallbladder:  Normally distended without stones or wall thickening.  No pericholecystic fluid or sonographic Murphy sign.  Common bile duct:  Diameter: Normal caliber 4 mm diameter  Liver:  Normal echogenicity. Small cyst in RIGHT lobe 1.6 x 1.5 x 1.4 cm. No additional mass lesions. No intrahepatic biliary dilatation. Hepatopetal portal venous flow.  No RIGHT upper quadrant free fluid.   IMPRESSION: Small hepatic cyst.  Otherwise normal exam.   Electronically Signed   By: Lavonia Dana M.D.   On: 03/05/2014 20:03     EKG Interpretation   Date/Time:  Wednesday March 05 2014 14:30:47 EST Ventricular Rate:  73 PR Interval:  166 QRS Duration: 124 QT Interval:  392 QTC Calculation: 431 R Axis:   96 Text Interpretation:  Normal sinus rhythm Rightward axis Septal infarct ,  age undetermined ST \\T \ T wave abnormality, consider lateral ischemia No  significant change since last tracing Confirmed by HARRISON  MD, FORREST  ([id number]) on 03/05/2014 3:55:04 PM      4:03 PM Patient seen and assessed by attending physician, Dr. Hampton Abbot. As per physician, agreed with work-up. Recommended d-dimer to be performed.   8:18 PM This provider spoke with Dr. Hadassah Pais, Cardiology. Discussed case, labs, imaging, vitals, ED course in great detail. As per physician, recommended patient to be admitted to Internal Medicine to be cardiac ruled out.   9:17 PM This provider spoke with Dr. Gasper Lloyd, [facility] - discussed case, labs, imaging, vitals, ED course in great detail. Patient to be admitted to Telemetry for cardiac rule  out.   MDM   Final diagnoses:  Right shoulder pain  Elevated d-dimer  Chest pain, unspecified chest pain type    Medications  sodium chloride 0.9 % bolus 500 mL (not administered)  sodium chloride 0.9 % bolus 500 mL (0 mLs Intravenous Stopped 03/05/14 1926)  iohexol (OMNIPAQUE) 350 MG/ML injection 100 mL (100 mLs Intravenous Contrast Given 03/05/14 1826)  morphine 2 MG/ML injection 2 mg (2 mg Intravenous Given 03/05/14 1847)     Filed Vitals:   03/05/14 1850 03/05/14 1900 03/05/14 2030 03/05/14 2100  BP: 165/68 161/64 159/79 153/63  Pulse: 63 59 59 60  Temp:      TempSrc:      Resp: 20 15 19 18   SpO2: 99% 100% 97% 97%    This provider reviewed the patient's chart. Patient was seen and assessed in the ED setting for the same complaint on 02/28/2014 -  patient worked up with negative findings on EKG, labs, and imaging. Patient seen and assessed by cardiology 03/04/2014 - recommended to continue with Ibuprofen for muscle pain. Patient presents with increased pain today. Cardiac catheterization performed in 2012 noted RCA stents x 2 that are patent. Carotid dopplers in 2014 negative. Patient had PM placed on 11/14/2013 for SSS.  EKG noted normal sinus rhythm with a heart rate of 73 bpm - no significant change since last tracing. Troponin negative elevation. BNP mildly elevated at 872.5. D-dimer elevated at 1.51. CBC negative findings. CMP unremarkable - negative elevated bilirubin or liver enzymes. Lipase negative elevation. Urinalysis negative nitrites and leukocytes identified. Chest xray negative for acute cardiopulmonary disease. Right shoulder plain film negative findings. CT angiogram of chest no evidence of pulmonary embolism. Right upper quadrant ultrasound unremarkable. Doubt cholecystitis/cholangitis. Negative findings of PE. Doubt dissection. Doubt UTI or pyelonephritis. Patient presenting to the ED with chest pain, this will be the patient's third visit to the ED/office regarding pain. Consulted cardiology who recommended patient to be admitted to Internal Medicine for cardiac rule out. Patient admitted to Telemetry. Discussed plan for admission with patient who agrees to plan of care. Patient stable for transfer.   Jamse Mead, PA-C 03/05/14 2128  Pamella Pert, MD 03/05/14 [phone]

## 2014-03-05 NOTE — ED Notes (Signed)
Pt c/o increased pain in R chest and R shoulder associated with increased shortness of breath.Pain gets better when sitting up in bed

## 2014-03-05 NOTE — ED Notes (Signed)
Pt eating turkey sandwich

## 2014-03-05 NOTE — ED Notes (Signed)
Attempted to call report

## 2014-03-06 ENCOUNTER — Observation Stay (HOSPITAL_COMMUNITY): Payer: PRIVATE HEALTH INSURANCE

## 2014-03-06 DIAGNOSIS — R791 Abnormal coagulation profile: Secondary | ICD-10-CM | POA: Diagnosis not present

## 2014-03-06 DIAGNOSIS — I2510 Atherosclerotic heart disease of native coronary artery without angina pectoris: Secondary | ICD-10-CM

## 2014-03-06 DIAGNOSIS — R1011 Right upper quadrant pain: Secondary | ICD-10-CM | POA: Diagnosis not present

## 2014-03-06 DIAGNOSIS — R079 Chest pain, unspecified: Secondary | ICD-10-CM

## 2014-03-06 DIAGNOSIS — M25511 Pain in right shoulder: Secondary | ICD-10-CM | POA: Diagnosis not present

## 2014-03-06 DIAGNOSIS — R0782 Intercostal pain: Secondary | ICD-10-CM

## 2014-03-06 DIAGNOSIS — I25110 Atherosclerotic heart disease of native coronary artery with unstable angina pectoris: Secondary | ICD-10-CM

## 2014-03-06 LAB — BASIC METABOLIC PANEL
Anion gap: 14 (ref 5–15)
BUN: 14 mg/dL (ref 6–23)
CHLORIDE: 108 meq/L (ref 96–112)
CO2: 23 mEq/L (ref 19–32)
Calcium: 9 mg/dL (ref 8.4–10.5)
Creatinine, Ser: 0.76 mg/dL (ref 0.50–1.10)
GFR calc Af Amer: >90 mL/min (ref >90)
GFR, EST NON AFRICAN AMERICAN: 85 mL/min — AB (ref >90)
Glucose, Bld: 104 mg/dL — ABNORMAL HIGH (ref 70–99)
Potassium: 3.6 mEq/L — ABNORMAL LOW (ref 3.7–5.3)
Sodium: 145 mEq/L (ref 137–147)

## 2014-03-06 LAB — CBC
HCT: 33.4 % — ABNORMAL LOW (ref 36.0–46.0)
HEMOGLOBIN: 10.6 g/dL — AB (ref 12.0–15.0)
MCH: 29.3 pg (ref 26.0–34.0)
MCHC: 31.7 g/dL (ref 30.0–36.0)
MCV: 92.3 fL (ref 78.0–100.0)
Platelets: 326 10*3/uL (ref 150–400)
RBC: 3.62 MIL/uL — ABNORMAL LOW (ref 3.87–5.11)
RDW: 14 % (ref 11.5–15.5)
WBC: 5.7 10*3/uL (ref 4.0–10.5)

## 2014-03-06 MED ORDER — PROMETHAZINE HCL 25 MG/ML IJ SOLN
6.2500 mg | Freq: Once | INTRAMUSCULAR | Status: AC
  Administered 2014-03-06: 6.25 mg via INTRAVENOUS

## 2014-03-06 MED ORDER — ONDANSETRON HCL 4 MG/2ML IJ SOLN
INTRAMUSCULAR | Status: AC
  Filled 2014-03-06: qty 2

## 2014-03-06 MED ORDER — TECHNETIUM TC 99M SESTAMIBI GENERIC - CARDIOLITE
10.0000 | Freq: Once | INTRAVENOUS | Status: AC | PRN
Start: 2014-03-06 — End: 2014-03-06
  Administered 2014-03-06: 10 via INTRAVENOUS

## 2014-03-06 MED ORDER — REGADENOSON 0.4 MG/5ML IV SOLN
0.4000 mg | Freq: Once | INTRAVENOUS | Status: DC
  Filled 2014-03-06: qty 5

## 2014-03-06 MED ORDER — REGADENOSON 0.4 MG/5ML IV SOLN
INTRAVENOUS | Status: AC
  Administered 2014-03-06: 0.4 mg via INTRAVENOUS
  Filled 2014-03-06: qty 5

## 2014-03-06 MED ORDER — PROMETHAZINE HCL 25 MG/ML IJ SOLN: 6.2500 mg | Freq: Once | INTRAMUSCULAR | Status: DC

## 2014-03-06 MED ORDER — PROMETHAZINE HCL 25 MG/ML IJ SOLN
INTRAMUSCULAR | Status: AC
  Filled 2014-03-06: qty 1

## 2014-03-06 MED ORDER — TECHNETIUM TC 99M SESTAMIBI GENERIC - CARDIOLITE
30.0000 | Freq: Once | INTRAVENOUS | Status: AC | PRN
  Administered 2014-03-06: 30 via INTRAVENOUS

## 2014-03-06 MED ORDER — TRAMADOL HCL 50 MG PO TABS: 50.0000 mg | ORAL_TABLET | Freq: Four times a day (QID) | ORAL | Status: DC | PRN

## 2014-03-06 NOTE — Plan of Care (Signed)
Problem: Consults Goal: Chest Pain Patient Education (See Patient Education module for education specifics.)  Outcome: Completed/Met Date Met:  03/06/14 Goal: Skin Care Protocol Initiated - if Braden Score 18 or less If consults are not indicated, leave blank or document N/A  Outcome: Not Applicable Date Met:  22/29/79 Goal: Tobacco Cessation referral if indicated Outcome: Not Applicable Date Met:  89/21/19 Goal: Diabetes Guidelines if Diabetic/Glucose > 140 If diabetic or lab glucose is > 140 mg/dl - Initiate Diabetes/Hyperglycemia Guidelines & Document Interventions  Outcome: Not Applicable Date Met:  41/74/08  Problem: Phase I Progression Outcomes Goal: Hemodynamically stable Outcome: Completed/Met Date Met:  03/06/14 Goal: Voiding-avoid urinary catheter unless indicated Outcome: Completed/Met Date Met:  03/06/14

## 2014-03-06 NOTE — H&P (Signed)
[facility] Admission History and Physical       Katherine [name]:[id number] DOB: September 29, 1945 DOA: 03/05/2014  Referring physician: EDP PCP: Katherine Coco, MD  Specialists:   Chief Complaint: Chest Pain  HPI: Katherine [name] is a 68 y.o. female with a history of CAD S/P PCI with Stents x 2 of the RCA, sick Sinus syndrome S/P Pacemaker, HTN, MGUS, and Hyperlipidemia who presents to the ED with complaints of intermittent substernal chest pain for the past 24 hours associated with SOB.   At times the pain radiated into her left Shoulder.   At the worst the pain was a 10/10.   The pain was described as sharp and at times burning.    She also reports having some RUQ ABD Pain, and an Ultrasound of the ABD was performed in the ED and was negative for acute findings.    The initial cardiac workup was negative and so was a CTA of the Chest, and she was referred for medical admissiion    Review of Systems:  Constitutional: No Weight Loss, No Weight Gain, Night Sweats, Fevers, Chills, Dizziness, Fatigue, or Generalized Weakness HEENT: No Headaches, Difficulty Swallowing,Tooth/Dental Problems,Sore Throat,  No Sneezing, Rhinitis, Ear Ache, Nasal Congestion, or Post Nasal Drip,  Cardio-vascular:   +Chest pain, Orthopnea, PND, Edema in Lower Extremities, Anasarca, Dizziness, Palpitations  Resp: No Dyspnea, No DOE, No Productive Cough, No Non-Productive Cough, No Hemoptysis, No Wheezing.    GI: No Heartburn, Indigestion, Abdominal Pain, Nausea, Vomiting, Diarrhea, Hematemesis, Hematochezia, Melena, Change in Bowel Habits,  Loss of Appetite  GU: No Dysuria, Change in Color of Urine, No Urgency or Frequency, No Flank pain.  Musculoskeletal: No Joint Pain or Swelling, No Decreased Range of Motion, No Back Pain.  Neurologic: No Syncope, No Seizures, Muscle Weakness, Paresthesia, Vision Disturbance or Loss, No Diplopia, No Vertigo, No Difficulty Walking,  Skin: No Rash or Lesions. Psych: No Change in  Mood or Affect, No Depression or Anxiety, No Memory loss, No Confusion, or Hallucinations   Past Medical History  Diagnosis Date  . Coronary artery disease     a. s/p overlapping DES to RCA 04/2010 (days after neg stress test). b. Cath 06/2010 - patent stents, nonobst dz otherwise; c. 10/2012 Cath: LM nl, LAD 60m, D1/2/3 nl, LCX 20p, OM2 50p, RCA 20% ISR mid, EF 55-60% ->Med Rx.    [name] Hypertension   . HLD (hyperlipidemia)   . Left bundle-branch block   . Tobacco abuse   . Sinus bradycardia     a. In past prevented BB use.  . Monoclonal gammopathy of undetermined significance     a. Previously seen by heme - this is per chart but pt does not recall.  [name] PVC's (premature ventricular contractions)   . Carotid artery disease     a. Hx r carotid bruit - dopplers 2009 <50% bilat;  b. 10/2012 U/S: no hemodynamically signif stenosis.  . Sick sinus syndrome     a. s/p PPM on 11/14/13 with Medtronic Adapta L model ADDRL 1 (serial number [id number]) pacemaker  . History of echocardiogram     a. 2D ECHO: 11/14/2013: EF 60-65%, mild concentric LVH, no RWMAs. G1DD. Mod LA dilation, mild RA dilation, PA pressure 31.      Past Surgical History  Procedure Laterality Date  . Pci      HIGH GRADE LESION IN THE MIDPORTION OF THE RIGHT CORONARY ARTERY WHICH WAS A LARGE ARTERY  . Cardiac catheterization  05/14/10  SHOWED EVIDENCE OF REVERSIBLE VASOSPASM  . Pacemaker insertion  11/14/13    MDT Adapta L implanted by Dr Rayann Heman for Sick sinus syndrome       Prior to Admission medications   Medication Sig Start Date End Date Taking? Authorizing Provider  amLODipine (NORVASC) 10 MG tablet Take 1 tablet (10 mg total) by mouth daily. 12/25/13  Yes Katherine Coco, MD  Ascorbic Acid (VITAMIN C) 500 MG tablet Take 250 mg by mouth daily.    Yes Historical Provider, MD  aspirin 81 MG tablet Take 81 mg by mouth daily.    Yes Historical Provider, MD  CALCIUM PO Take 1 capsule by mouth daily.   Yes Historical  Provider, MD  Cholecalciferol (VITAMIN D PO) Take 1 tablet by mouth daily.   Yes Historical Provider, MD  cyclobenzaprine (FLEXERIL) 5 MG tablet Take 5 mg by mouth 3 (three) times daily as needed for muscle spasms.   Yes Historical Provider, MD  hydrochlorothiazide (MICROZIDE) 12.5 MG capsule Take 12.5 mg by mouth daily.   Yes Historical Provider, MD  ibuprofen (ADVIL,MOTRIN) 600 MG tablet Take 1 tablet (600 mg total) by mouth every 6 (six) hours as needed. 03/01/14  Yes Julianne Rice, MD  losartan (COZAAR) 50 MG tablet Take 1 tablet (50 mg total) by mouth daily. 03/04/14  Yes Katherine Coco, MD  meclizine (ANTIVERT) 12.5 MG tablet Take 1 tablet (12.5 mg total) by mouth 3 (three) times daily as needed for dizziness. 12/11/13  Yes Debby Freiberg, MD  nitroGLYCERIN (NITROSTAT) 0.4 MG SL tablet Place 0.4 mg under the tongue every 5 (five) minutes as needed for chest pain (MAX 3 TABLETS).    Yes Historical Provider, MD      Allergies  Allergen Reactions  . Isosorbide Swelling    SWELLING OF THE TONGUE - pt states she received this after PNA ? But has been able to tolerate SL nitroglycerin without adverse reaction.  . Metoprolol Succinate [Metoprolol] Other (See Comments)    Dizziness, diarrhea, tingling in fingers  . Zocor [Simvastatin] Other (See Comments)    Leg pain - does not tolerate high doses of statins  . Ezetimibe Other (See Comments)    unknown  . Plavix [Clopidogrel Bisulfate] Other (See Comments)    Prior P2Y12 showing 267 (poor Plavix responsiveness)  . Sulfa Antibiotics Other (See Comments)    Sulfa drugs as a child. UNKNOWN REACTION  . Sulfonamide Derivatives Other (See Comments)    Sulfa drugs as a child. UNKNOWN REACTION     Social History:  reports that she has quit smoking. She does not have any smokeless tobacco history on file. She reports that she does not drink alcohol or use illicit drugs.     Family History  Problem Relation Age of Onset  . Cancer Father     . Heart disease Father        Physical Exam:  GEN:  Pleasant Well Nourished and Well Developed  68 y.o. Caucasian  female examined and in no acute distress; cooperative with exam Filed Vitals:   03/05/14 2200 03/05/14 2219 03/05/14 2245 03/05/14 2312  BP: 154/69 154/69 159/82 110/71  Pulse: 59 65 57 76  Temp:  97.8 F (36.6 C)  97.5 F (36.4 C)  TempSrc:  Oral  Oral  Resp: 19 16 19 18   Height:    5\' 5"  (1.651 m)  Weight:    61.145 kg (134 lb 12.8 oz)  SpO2: 98% 99% 98% 100%   Blood pressure 110/71, pulse  76, temperature 97.5 F (36.4 C), temperature source Oral, resp. rate 18, height 5\' 5"  (1.651 m), weight 61.145 kg (134 lb 12.8 oz), SpO2 100 %. PSYCH: She is alert and oriented x4; does not appear anxious does not appear depressed; affect is normal HEENT: Normocephalic and Atraumatic, Mucous membranes pink; PERRLA; EOM intact; Fundi:  Benign;  No scleral icterus, Nares: Patent, Oropharynx: Clear, Edentulous or Fair Dentition,    Neck:  FROM, No Cervical Lymphadenopathy nor Thyromegaly or Carotid Bruit; No JVD; Breasts:: Not examined CHEST WALL: No tenderness CHEST: Normal respiration, clear to auscultation bilaterally HEART: Regular rate and rhythm; no murmurs rubs or gallops BACK: No kyphosis or scoliosis; No CVA tenderness ABDOMEN: Positive Bowel Sounds,  Soft Non-Tender; No Masses, No Organomegaly. Rectal Exam: Not done EXTREMITIES: No Cyanosis, Clubbing, or Edema; No Ulcerations. Genitalia: not examined PULSES: 2+ and symmetric SKIN: Normal hydration no rash or ulceration CNS:  Alert and Oriented x 4, No Focal Deficits Vascular: pulses palpable throughout    Labs on Admission:  Basic Metabolic Panel:  Recent Labs Lab 02/28/14 2315 03/05/14 1521  NA 144 141  K 3.8 3.7  CL 106 104  CO2 24 21  GLUCOSE 106* 98  BUN 14 13  CREATININE 0.72 0.95  CALCIUM 9.0 9.0   Liver Function Tests:  Recent Labs Lab 02/28/14 2315 03/05/14 1521  AST 18 18  ALT 10 10   ALKPHOS 65 66  BILITOT 0.3 0.2*  PROT 6.9 7.1  ALBUMIN 3.5 3.4*    Recent Labs Lab 03/05/14 1544  LIPASE 22   No results for input(s): AMMONIA in the last 168 hours. CBC:  Recent Labs Lab 02/28/14 2315 03/05/14 1521  WBC 6.5 5.2  NEUTROABS 3.5  --   HGB 11.4* 11.6*  HCT 34.1* 36.2  MCV 93.9 92.3  PLT 304 348   Cardiac Enzymes:  Recent Labs Lab 02/28/14 2315 03/05/14 1521 03/05/14 2021  TROPONINI <0.30 <0.30 <0.30    BNP (last 3 results)  Recent Labs  03/05/14 1544  PROBNP 872.5*   CBG: No results for input(s): GLUCAP in the last 168 hours.  Radiological Exams on Admission: Dg Chest 2 View  03/05/2014   CLINICAL DATA:  Initial encounter for acute right chest and site pain for 2-3 days, posterior right shoulder pain also, personal history of hypertension  EXAM: CHEST  2 VIEW  COMPARISON:  03/01/2014  FINDINGS: Stable mild cardiac enlargement. Vascular pattern normal. Cardiac pacer in unchanged position.  Stable minimal interstitial changes at both lung bases suggesting mild atelectasis or scarring. No consolidation or effusion.  IMPRESSION: No acute cardiopulmonary process.   Electronically Signed   By: Skipper Cliche M.D.   On: 03/05/2014 17:00   Dg Shoulder Right  03/05/2014   CLINICAL DATA:  Right chest pain for 2-3 days. Posterior shoulder pain as well.  EXAM: RIGHT SHOULDER - 2+ VIEW  COMPARISON:  03/05/2014 Chest radiograph  FINDINGS: There is no evidence of fracture or dislocation. There is no evidence of arthropathy or other focal bone abnormality. Soft tissues are unremarkable.  IMPRESSION: Negative.   Electronically Signed   By: Sherryl Barters M.D.   On: 03/05/2014 17:00   Ct Angio Chest Pe W/cm &/or Wo Cm  03/05/2014   CLINICAL DATA:  Chest pain, mid back pain, shortness of breath with exertion, elevated D-dimer, recent pacemaker placement, history hypertension, smoking, coronary artery disease post stenting, arrhythmia  EXAM: CT ANGIOGRAPHY CHEST  WITH CONTRAST  TECHNIQUE: Multidetector CT imaging of the  chest was performed using the standard protocol during bolus administration of intravenous contrast. Multiplanar CT image reconstructions and MIPs were obtained to evaluate the vascular anatomy.  CONTRAST:  144mL OMNIPAQUE IOHEXOL 350 MG/ML SOLN IV  COMPARISON:  06/13/2008  FINDINGS: Beam hardening artifacts from pacemaker generator at upper LEFT anterior chest.  Scattered atherosclerotic calcifications aorta and coronary arteries.  No aortic aneurysm or dissection.  Pulmonary arteries well opacified and patent.  No evidence of pulmonary embolism.  No thoracic adenopathy.  Pacemaker leads in RIGHT atrium and RIGHT ventricle.  Cyst within RIGHT lobe liver 18 x 13 mm.  Cyst at medial aspect upper pole LEFT kidney incompletely seen, 2.6 x 2.5 cm image 263.  Remaining visualized upper abdomen unremarkable  Emphysematous changes with minimal dependent atelectasis in the posterior lower lobes.  Minimal subpleural likely chronic interstitial disease BILATERAL upper lobes, stable on LEFT.  Large area of confluent airspace consolidation involving the RIGHT upper lobe on the previous exam has cleared.  No acute infiltrate, pleural effusion, pneumothorax, or definite mass/nodule.  No acute osseous findings.  Review of the MIP images confirms the above findings.  IMPRESSION: No evidence of pulmonary embolism.  Minimal dependent atelectasis at lower lobes.  Hepatic and LEFT renal cysts.  No definite acute intra thoracic abnormalities.   Electronically Signed   By: Lavonia Dana M.D.   On: 03/05/2014 18:54   US Abdomen Limited Ruq  03/05/2014   CLINICAL DATA:  RIGHT shoulder pain, abdominal pain, history hypertension, coronary artery disease, pacemaker  EXAM: US ABDOMEN LIMITED - RIGHT UPPER QUADRANT  COMPARISON:  None  FINDINGS: Gallbladder:  Normally distended without stones or wall thickening.  No pericholecystic fluid or sonographic Murphy sign.  Common bile duct:   Diameter: Normal caliber 4 mm diameter  Liver:  Normal echogenicity. Small cyst in RIGHT lobe 1.6 x 1.5 x 1.4 cm. No additional mass lesions. No intrahepatic biliary dilatation. Hepatopetal portal venous flow.  No RIGHT upper quadrant free fluid.  IMPRESSION: Small hepatic cyst.  Otherwise normal exam.   Electronically Signed   By: Lavonia Dana M.D.   On: 03/05/2014 20:03     EKG: Independently reviewed. Normal Sinus Rhythm rate 73 Previous Septal Infarct changes   Assessment/Plan:   68 y.o. female with  Principal Problem:   1.    Chest pain   Telemetry Monitoring   Cycle Troponins   ASA Rx   NTG SL PRN   Active Problems:   2.    Coronary artery disease, hx of stents to RCA   On ASA Rx       3.    HLD (hyperlipidemia)   Check Fasting Lipids     4.    Hypertensive cardiovascular disease   Continue Amlodipine and HCTZ Rx   Monitor BPs     5.    Sick sinus syndrome   Has Pacemaker     6.    Monoclonal gammopathy of undetermined significance   Hx     7.    DVT Prophylaxis   Lovenox      Code Status:     FULL CODE  Family Communication:   No Family Present  Disposition Plan:   Observation toTelemetry Bed      Time spent:  [facility] Pager [phone]   If [place] Please Contact the Day Rounding Team MD for [facility]  If 7PM-7AM, Please Contact Night-Floor Coverage  [email or web address] Password [email or web address] 03/06/2014, 4:22 AM

## 2014-03-06 NOTE — Progress Notes (Signed)
UR completed 

## 2014-03-06 NOTE — Discharge Summary (Addendum)
Physician Discharge Summary  Katherine [name] FFM:[id number] DOB: Aug 22, 1945 DOA: 03/05/2014  PCP: Darlin Coco, MD  Admit date: 03/05/2014 Discharge date: 03/06/2014  Time spent: 25 minutes  Recommendations for Outpatient Follow-up:  1. Discharge home with outpatient PCP follow-up  Discharge Diagnoses:  Principal Problem:   Chest pain, atypical likely musculoskeletal  Active Problems:   Coronary artery disease, hx of stents to RCA   Sick sinus syndrome   Monoclonal gammopathy of undetermined significance   HLD (hyperlipidemia)   Hypertensive cardiovascular disease   Discharge Condition: Fair  Diet recommendation: Heart healthy  Filed Weights   03/05/14 2312 03/06/14 0504  Weight: 61.145 kg (134 lb 12.8 oz) 60.782 kg (134 lb)    History of present illness:  Please refer to admission H&P for details, but in brief, 68 year old female with history of coronary artery disease status post PCI with stents x2 of the RCA, sick sinus syndrome status post pacemaker, hypertension, MGUS, hyperlipidemia presented to the ED with persistent pain over the right scapula radiating to the right breast for past 24 hours associated with some shortness of breath. Pain was 10/10 in intensity and sharp and not relieved with Motrin and heating pads. Ultrasound of the abdomen was done in the ED which was negative for acute cholecystitis. LFTs were normal. Initial cardiac workup was negative. D-dimer was elevated to 1.51. A CT angiogram of the chest was done which was negative for PE.  admitted under observation for further cardiac workup.   Hospital Course:  Chest pain Appears atypical and likely musculoskeletal. Patient denies any trauma. Symptoms resolved after receiving Dilaudid in the hospital. Patient seen by cardiology and given history of coronary artery disease and RCA stents, he underwent [place] which was negative for ischemia or infarct with EF of 72%. Patient has been stable on  telemetry and serial cardiac enzymes negative. will be discharged on oral tramadol as needed for pain. She can continue when necessary ibuprofen and apply heat pads.  Coronary  artery disease with history of RCA stent Continue aspirin  Hypertension Continue amlodipine,  losartan and HCTZ  Procedures:  [place]  Consultations:  Cardiology  Discharge Exam: Filed Vitals:   03/06/14 1322  BP: 140/63  Pulse: 61  Temp: 98.1 F (36.7 C)  Resp: 18    General: Elderly thin  female in no acute distress HEENT: Moist oral mucosa Chest: Clear to auscultation bilaterally, no deformity, nontender to palpation CVS: Normal S1 and S2, no murmurs Abdomen soft, nontender, nondistended,  Extremities: Warm, no edema    Discharge Instructions You were cared for by a hospitalist during your hospital stay. If you have any questions about your discharge medications or the care you received while you were in the hospital after you are discharged, you can call the unit and asked to speak with the hospitalist on call if the hospitalist that took care of you is not available. Once you are discharged, your primary care physician will handle any further medical issues. Please note that NO REFILLS for any discharge medications will be authorized once you are discharged, as it is imperative that you return to your primary care physician (or establish a relationship with a primary care physician if you do not have one) for your aftercare needs so that they can reassess your need for medications and monitor your lab values.   Current Discharge Medication List    START taking these medications   Details  traMADol (ULTRAM) 50 MG tablet Take 1 tablet (50  mg total) by mouth every 6 (six) hours as needed. Qty: 30 tablet, Refills: 0      CONTINUE these medications which have NOT CHANGED   Details  amLODipine (NORVASC) 10 MG tablet Take 1 tablet (10 mg total) by mouth daily. Qty: 30 tablet, Refills: 3     Ascorbic Acid (VITAMIN C) 500 MG tablet Take 250 mg by mouth daily.     aspirin 81 MG tablet Take 81 mg by mouth daily.     CALCIUM PO Take 1 capsule by mouth daily.    Cholecalciferol (VITAMIN D PO) Take 1 tablet by mouth daily.    cyclobenzaprine (FLEXERIL) 5 MG tablet Take 5 mg by mouth 3 (three) times daily as needed for muscle spasms.    hydrochlorothiazide (MICROZIDE) 12.5 MG capsule Take 12.5 mg by mouth daily.    ibuprofen (ADVIL,MOTRIN) 600 MG tablet Take 1 tablet (600 mg total) by mouth every 6 (six) hours as needed. Qty: 30 tablet, Refills: 0    losartan (COZAAR) 50 MG tablet Take 1 tablet (50 mg total) by mouth daily. Qty: 30 tablet, Refills: 1    meclizine (ANTIVERT) 12.5 MG tablet Take 1 tablet (12.5 mg total) by mouth 3 (three) times daily as needed for dizziness. Qty: 30 tablet, Refills: 0    nitroGLYCERIN (NITROSTAT) 0.4 MG SL tablet Place 0.4 mg under the tongue every 5 (five) minutes as needed for chest pain (MAX 3 TABLETS).        Allergies  Allergen Reactions  . Isosorbide Swelling    SWELLING OF THE TONGUE - pt states she received this after PNA ? But has been able to tolerate SL nitroglycerin without adverse reaction.  . Metoprolol Succinate [Metoprolol] Other (See Comments)    Dizziness, diarrhea, tingling in fingers  . Zocor [Simvastatin] Other (See Comments)    Leg pain - does not tolerate high doses of statins  . Ezetimibe Other (See Comments)    unknown  . Plavix [Clopidogrel Bisulfate] Other (See Comments)    Prior P2Y12 showing 267 (poor Plavix responsiveness)  . Sulfa Antibiotics Other (See Comments)    Sulfa drugs as a child. UNKNOWN REACTION  . Sulfonamide Derivatives Other (See Comments)    Sulfa drugs as a child. UNKNOWN REACTION   Follow-up Information    Follow up with Darlin Coco, MD. Schedule an appointment as soon as possible for a visit in 2 weeks.   Specialty:  Cardiology   Contact information:   [address] [place] [address] [phone]        The results of significant diagnostics from this hospitalization (including imaging, microbiology, ancillary and laboratory) are listed below for reference.    Significant Diagnostic Studies: Dg Chest 2 View  03/05/2014   CLINICAL DATA:  Initial encounter for acute right chest and site pain for 2-3 days, posterior right shoulder pain also, personal history of hypertension  EXAM: CHEST  2 VIEW  COMPARISON:  03/01/2014  FINDINGS: Stable mild cardiac enlargement. Vascular pattern normal. Cardiac pacer in unchanged position.  Stable minimal interstitial changes at both lung bases suggesting mild atelectasis or scarring. No consolidation or effusion.  IMPRESSION: No acute cardiopulmonary process.   Electronically Signed   By: Skipper Cliche M.D.   On: 03/05/2014 17:00   Dg Chest 2 View  03/01/2014   CLINICAL DATA:  Initial evaluation upper back pain right-sided chest pain all day  EXAM: CHEST  2 VIEW  COMPARISON:  12/11/2013  FINDINGS: Mild cardiac  enlargement stable. Uncoiling of the aorta. Cardiac pacer. Vascular pattern normal.  Mild scarring or atelectasis in both lung bases similar to prior study. No effusion or pneumothorax.  IMPRESSION: No active cardiopulmonary disease.   Electronically Signed   By: Skipper Cliche M.D.   On: 03/01/2014 00:31   Dg Shoulder Right  03/05/2014   CLINICAL DATA:  Right chest pain for 2-3 days. Posterior shoulder pain as well.  EXAM: RIGHT SHOULDER - 2+ VIEW  COMPARISON:  03/05/2014 Chest radiograph  FINDINGS: There is no evidence of fracture or dislocation. There is no evidence of arthropathy or other focal bone abnormality. Soft tissues are unremarkable.  IMPRESSION: Negative.   Electronically Signed   By: Sherryl Barters M.D.   On: 03/05/2014 17:00   Ct Angio Chest Pe W/cm &/or Wo Cm  03/05/2014   CLINICAL DATA:  Chest pain, mid back pain, shortness of breath with exertion, elevated D-dimer, recent  pacemaker placement, history hypertension, smoking, coronary artery disease post stenting, arrhythmia  EXAM: CT ANGIOGRAPHY CHEST WITH CONTRAST  TECHNIQUE: Multidetector CT imaging of the chest was performed using the standard protocol during bolus administration of intravenous contrast. Multiplanar CT image reconstructions and MIPs were obtained to evaluate the vascular anatomy.  CONTRAST:  125mL OMNIPAQUE IOHEXOL 350 MG/ML SOLN IV  COMPARISON:  06/13/2008  FINDINGS: Beam hardening artifacts from pacemaker generator at upper LEFT anterior chest.  Scattered atherosclerotic calcifications aorta and coronary arteries.  No aortic aneurysm or dissection.  Pulmonary arteries well opacified and patent.  No evidence of pulmonary embolism.  No thoracic adenopathy.  Pacemaker leads in RIGHT atrium and RIGHT ventricle.  Cyst within RIGHT lobe liver 18 x 13 mm.  Cyst at medial aspect upper pole LEFT kidney incompletely seen, 2.6 x 2.5 cm image 263.  Remaining visualized upper abdomen unremarkable  Emphysematous changes with minimal dependent atelectasis in the posterior lower lobes.  Minimal subpleural likely chronic interstitial disease BILATERAL upper lobes, stable on LEFT.  Large area of confluent airspace consolidation involving the RIGHT upper lobe on the previous exam has cleared.  No acute infiltrate, pleural effusion, pneumothorax, or definite mass/nodule.  No acute osseous findings.  Review of the MIP images confirms the above findings.  IMPRESSION: No evidence of pulmonary embolism.  Minimal dependent atelectasis at lower lobes.  Hepatic and LEFT renal cysts.  No definite acute intra thoracic abnormalities.   Electronically Signed   By: Lavonia Dana M.D.   On: 03/05/2014 18:54   Nm Myocar Multi W/spect W/wall Motion / Ef  03/06/2014   CLINICAL DATA:  Chest pain  EXAM: Lexiscan Myovue  TECHNIQUE: The patient received IV Lexiscan .4mg  over 15 seconds. 33.0 mCi of Technetium 57m Sestamibi injected at 30 seconds.  Quantitative SPECT images were obtained in the vertical, horizontal and short axis planes after a 45 minute delay. Rest images were obtained with similar planes and delay using 10.2 mCi of Technetium 50m Sestamibi.  FINDINGS: ECG:  SR IVCD poor R wave progression no change with lexiscan  Symptoms: Nausea  RAW Data: Normal  QPS: Normal  Quantitative Gated SPECT EF: 72%  Perfusion Images: Normal  IMPRESSION: Normal Lexiscan Myovue EF 72% no ischemia or infarct  Jenkins Rouge   Electronically Signed   By: Jenkins Rouge M.D.   On: 03/06/2014 15:44   US Abdomen Limited Ruq  03/05/2014   CLINICAL DATA:  RIGHT shoulder pain, abdominal pain, history hypertension, coronary artery disease, pacemaker  EXAM: US ABDOMEN LIMITED - RIGHT UPPER QUADRANT  COMPARISON:  None  FINDINGS: Gallbladder:  Normally distended without stones or wall thickening.  No pericholecystic fluid or sonographic Murphy sign.  Common bile duct:  Diameter: Normal caliber 4 mm diameter  Liver:  Normal echogenicity. Small cyst in RIGHT lobe 1.6 x 1.5 x 1.4 cm. No additional mass lesions. No intrahepatic biliary dilatation. Hepatopetal portal venous flow.  No RIGHT upper quadrant free fluid.  IMPRESSION: Small hepatic cyst.  Otherwise normal exam.   Electronically Signed   By: Lavonia Dana M.D.   On: 03/05/2014 20:03    Microbiology: No results found for this or any previous visit (from the past 240 hour(s)).   Labs: Basic Metabolic Panel:  Recent Labs Lab 02/28/14 2315 03/05/14 1521 03/06/14 0352  NA 144 141 145  K 3.8 3.7 3.6*  CL 106 104 108  CO2 24 21 23   GLUCOSE 106* 98 104*  BUN 14 13 14   CREATININE 0.72 0.95 0.76  CALCIUM 9.0 9.0 9.0   Liver Function Tests:  Recent Labs Lab 02/28/14 2315 03/05/14 1521  AST 18 18  ALT 10 10  ALKPHOS 65 66  BILITOT 0.3 0.2*  PROT 6.9 7.1  ALBUMIN 3.5 3.4*    Recent Labs Lab 03/05/14 1544  LIPASE 22   No results for input(s): AMMONIA in the last 168 hours. CBC:  Recent  Labs Lab 02/28/14 2315 03/05/14 1521 03/06/14 0352  WBC 6.5 5.2 5.7  NEUTROABS 3.5  --   --   HGB 11.4* 11.6* 10.6*  HCT 34.1* 36.2 33.4*  MCV 93.9 92.3 92.3  PLT 304 348 326   Cardiac Enzymes:  Recent Labs Lab 02/28/14 2315 03/05/14 1521 03/05/14 2021  TROPONINI <0.30 <0.30 <0.30   BNP: BNP (last 3 results)  Recent Labs  03/05/14 1544  PROBNP 872.5*   CBG: No results for input(s): GLUCAP in the last 168 hours.     SignedLouellen Molder  [facility] 03/06/2014, 4:41 PM

## 2014-03-06 NOTE — Consult Note (Addendum)
CONSULTATION NOTE  Reason for Consult: Chest pain  Requesting Physician: Dr. Clementeen Graham  Cardiologist: Dr. Mare Ferrari  HPI: This is a 68 y.o. female with a past medical history significant for coronary artery disease status post overlapping DES to the RCA in January 2012 after a negative stress test, repeat cath for chest pain in March 2012 showed patent stents and nonobstructive disease. She had an additional cardiac catheterization in July 2014 which showed mild irregularities and medical therapy was recommended. She presented in 2015 with back pain and an EKG which showed a heart rate of 46 and left bundle-branch block, she then became symptomatic and nauseated and her heart rate dropped into the 30s and a pacemaker was recommended and implanted by Dr. Rayann Heman on 11/14/2013.  She was seen in the emergency department on 02/28/2014 and thought to have musculoskeletal chest pain. She was discharged on Flexeril. In the interim she saw Dr. Mare Ferrari who increased her blood pressure medication. She now presents again with chest pain. This is described as intermittent substernal chest pain for the past 24 hours associated with shortness of breath. At times pain was noted to radiate to her left shoulder and reached a 10 out of 10 in intensity. Quality was somewhat burning and there was associated right upper quadrant abdominal pain. CTA of the chest was performed and negative for pulmonary embolus, aneurysm or dissection. Cardiology is asked to consult for recommendations.  PMHx:  Past Medical History  Diagnosis Date  . Coronary artery disease     a. s/p overlapping DES to RCA 04/2010 (days after neg stress test). b. Cath 06/2010 - patent stents, nonobst dz otherwise; c. 10/2012 Cath: LM nl, LAD 59m D1/2/3 nl, LCX 20p, OM2 50p, RCA 20% ISR mid, EF 55-60% ->Med Rx.    .Marland KitchenHypertension   . HLD (hyperlipidemia)   . Left bundle-branch block   . Tobacco abuse   . Sinus bradycardia     a. In past  prevented BB use.  . Monoclonal gammopathy of undetermined significance     a. Previously seen by heme - this is per chart but pt does not recall.  .Marland KitchenPVC's (premature ventricular contractions)   . Carotid artery disease     a. Hx r carotid bruit - dopplers 2009 <50% bilat;  b. 10/2012 U/S: no hemodynamically signif stenosis.  . Sick sinus syndrome     a. s/p PPM on 11/14/13 with Medtronic Adapta L model ADDRL 1 (serial number [id number]) pacemaker  . History of echocardiogram     a. 2D ECHO: 11/14/2013: EF 60-65%, mild concentric LVH, no RWMAs. G1DD. Mod LA dilation, mild RA dilation, PA pressure 31.   Past Surgical History  Procedure Laterality Date  . Pci      HIGH GRADE LESION IN THE MIDPORTION OF THE RIGHT CORONARY ARTERY WHICH WAS A LARGE ARTERY  . Cardiac catheterization  05/14/10    SHOWED EVIDENCE OF REVERSIBLE VASOSPASM  . Pacemaker insertion  11/14/13    MDT Adapta L implanted by Dr ARayann Hemanfor Sick sinus syndrome    FAMHx: Family History  Problem Relation Age of Onset  . Cancer Father   . Heart disease Father     SOCHx:  reports that she has quit smoking. She does not have any smokeless tobacco history on file. She reports that she does not drink alcohol or use illicit drugs.  ALLERGIES: Allergies  Allergen Reactions  . Isosorbide Swelling    SWELLING OF THE TONGUE - pt  states she received this after PNA ? But has been able to tolerate SL nitroglycerin without adverse reaction.  . Metoprolol Succinate [Metoprolol] Other (See Comments)    Dizziness, diarrhea, tingling in fingers  . Zocor [Simvastatin] Other (See Comments)    Leg pain - does not tolerate high doses of statins  . Ezetimibe Other (See Comments)    unknown  . Plavix [Clopidogrel Bisulfate] Other (See Comments)    Prior P2Y12 showing 267 (poor Plavix responsiveness)  . Sulfa Antibiotics Other (See Comments)    Sulfa drugs as a child. UNKNOWN REACTION  . Sulfonamide Derivatives Other (See Comments)     Sulfa drugs as a child. UNKNOWN REACTION    ROS: A comprehensive review of systems was negative except for: Cardiovascular: positive for chest pain Gastrointestinal: positive for abdominal pain  HOME MEDICATIONS: Prescriptions prior to admission  Medication Sig Dispense Refill Last Dose  . amLODipine (NORVASC) 10 MG tablet Take 1 tablet (10 mg total) by mouth daily. 30 tablet 3 03/05/2014 at Unknown time  . Ascorbic Acid (VITAMIN C) 500 MG tablet Take 250 mg by mouth daily.    03/04/2014 at Unknown time  . aspirin 81 MG tablet Take 81 mg by mouth daily.    03/05/2014 at Unknown time  . CALCIUM PO Take 1 capsule by mouth daily.   03/04/2014 at Unknown time  . Cholecalciferol (VITAMIN D PO) Take 1 tablet by mouth daily.   03/04/2014 at Unknown time  . cyclobenzaprine (FLEXERIL) 5 MG tablet Take 5 mg by mouth 3 (three) times daily as needed for muscle spasms.   Past Week at Unknown time  . hydrochlorothiazide (MICROZIDE) 12.5 MG capsule Take 12.5 mg by mouth daily.   03/05/2014 at Unknown time  . ibuprofen (ADVIL,MOTRIN) 600 MG tablet Take 1 tablet (600 mg total) by mouth every 6 (six) hours as needed. 30 tablet 0 03/05/2014 at Unknown time  . losartan (COZAAR) 50 MG tablet Take 1 tablet (50 mg total) by mouth daily. 30 tablet 1 not started yet at Unknown time  . meclizine (ANTIVERT) 12.5 MG tablet Take 1 tablet (12.5 mg total) by mouth 3 (three) times daily as needed for dizziness. 30 tablet 0 Past Month at Unknown time  . nitroGLYCERIN (NITROSTAT) 0.4 MG SL tablet Place 0.4 mg under the tongue every 5 (five) minutes as needed for chest pain (MAX 3 TABLETS).    Past Month at Unknown time    HOSPITAL MEDICATIONS: Scheduled: . amLODipine  10 mg Oral Daily  . aspirin  325 mg Oral Daily  . calcium carbonate  1,250 mg Oral Q breakfast  . cholecalciferol  1,000 Units Oral Daily  . enoxaparin (LOVENOX) injection  40 mg Subcutaneous Daily  . hydrochlorothiazide  12.5 mg Oral Daily  . losartan   50 mg Oral Daily  . sodium chloride  3 mL Intravenous Q12H  . sodium chloride  3 mL Intravenous Q12H  . vitamin C  250 mg Oral Daily    VITALS: Blood pressure 144/68, pulse 62, temperature 98 F (36.7 C), temperature source Oral, resp. rate 18, height 5' 5"  (1.651 m), weight 134 lb (60.782 kg), SpO2 97 %.  PHYSICAL EXAM: General appearance: alert, no distress and very anxious Neck: no carotid bruit and no JVD Lungs: clear to auscultation bilaterally Heart: regular rate and rhythm, S1, S2 normal, no murmur, click, rub or gallop Abdomen: mild TTP midepigastrium Extremities: extremities normal, atraumatic, no cyanosis or edema Pulses: 2+ and symmetric Skin: Skin color, texture,  turgor normal. No rashes or lesions Neurologic: Grossly normal Psych: Very anxious  LABS: Results for orders placed or performed during the hospital encounter of 03/05/14 (from the past 48 hour(s))  CBC     Status: Abnormal   Collection Time: 03/05/14  3:21 PM  Result Value Ref Range   WBC 5.2 4.0 - 10.5 K/uL   RBC 3.92 3.87 - 5.11 MIL/uL   Hemoglobin 11.6 (L) 12.0 - 15.0 g/dL   HCT 36.2 36.0 - 46.0 %   MCV 92.3 78.0 - 100.0 fL   MCH 29.6 26.0 - 34.0 pg   MCHC 32.0 30.0 - 36.0 g/dL   RDW 13.9 11.5 - 15.5 %   Platelets 348 150 - 400 K/uL  Comprehensive metabolic panel     Status: Abnormal   Collection Time: 03/05/14  3:21 PM  Result Value Ref Range   Sodium 141 137 - 147 mEq/L   Potassium 3.7 3.7 - 5.3 mEq/L   Chloride 104 96 - 112 mEq/L   CO2 21 19 - 32 mEq/L   Glucose, Bld 98 70 - 99 mg/dL   BUN 13 6 - 23 mg/dL   Creatinine, Ser 0.95 0.50 - 1.10 mg/dL   Calcium 9.0 8.4 - 10.5 mg/dL   Total Protein 7.1 6.0 - 8.3 g/dL   Albumin 3.4 (L) 3.5 - 5.2 g/dL   AST 18 0 - 37 U/L   ALT 10 0 - 35 U/L   Alkaline Phosphatase 66 39 - 117 U/L   Total Bilirubin 0.2 (L) 0.3 - 1.2 mg/dL   GFR calc non Af Amer 61 (L) >90 mL/min   GFR calc Af Amer 70 (L) >90 mL/min    Comment: (NOTE) The eGFR has been  calculated using the CKD EPI equation. This calculation has not been validated in all clinical situations. eGFR's persistently <90 mL/min signify possible Chronic Kidney Disease.    Anion gap 16 (H) 5 - 15  Troponin I     Status: None   Collection Time: 03/05/14  3:21 PM  Result Value Ref Range   Troponin I <0.30 <0.30 ng/mL    Comment:        Due to the release kinetics of cTnI, a negative result within the first hours of the onset of symptoms does not rule out myocardial infarction with certainty. If myocardial infarction is still suspected, repeat the test at appropriate intervals.   D-dimer, quantitative     Status: Abnormal   Collection Time: 03/05/14  3:21 PM  Result Value Ref Range   D-Dimer, Quant 1.51 (H) 0.00 - 0.48 ug/mL-FEU    Comment:        AT THE INHOUSE ESTABLISHED CUTOFF VALUE OF 0.48 ug/mL FEU, THIS ASSAY HAS BEEN DOCUMENTED IN THE LITERATURE TO HAVE A SENSITIVITY AND NEGATIVE PREDICTIVE VALUE OF AT LEAST 98 TO 99%.  THE TEST RESULT SHOULD BE CORRELATED WITH AN ASSESSMENT OF THE CLINICAL PROBABILITY OF DVT / VTE.   Lipase, blood     Status: None   Collection Time: 03/05/14  3:44 PM  Result Value Ref Range   Lipase 22 11 - 59 U/L  Pro b natriuretic peptide (BNP)     Status: Abnormal   Collection Time: 03/05/14  3:44 PM  Result Value Ref Range   Pro B Natriuretic peptide (BNP) 872.5 (H) 0 - 125 pg/mL  Urinalysis, Routine w reflex microscopic     Status: None   Collection Time: 03/05/14  7:15 PM  Result Value Ref Range  Color, Urine YELLOW YELLOW   APPearance CLEAR CLEAR   Specific Gravity, Urine 1.018 1.005 - 1.030   pH 6.5 5.0 - 8.0   Glucose, UA NEGATIVE NEGATIVE mg/dL   Hgb urine dipstick NEGATIVE NEGATIVE   Bilirubin Urine NEGATIVE NEGATIVE   Ketones, ur NEGATIVE NEGATIVE mg/dL   Protein, ur NEGATIVE NEGATIVE mg/dL   Urobilinogen, UA 0.2 0.0 - 1.0 mg/dL   Nitrite NEGATIVE NEGATIVE   Leukocytes, UA NEGATIVE NEGATIVE    Comment: MICROSCOPIC  NOT DONE ON URINES WITH NEGATIVE PROTEIN, BLOOD, LEUKOCYTES, NITRITE, OR GLUCOSE <1000 mg/dL.  Troponin I     Status: None   Collection Time: 03/05/14  8:21 PM  Result Value Ref Range   Troponin I <0.30 <0.30 ng/mL    Comment:        Due to the release kinetics of cTnI, a negative result within the first hours of the onset of symptoms does not rule out myocardial infarction with certainty. If myocardial infarction is still suspected, repeat the test at appropriate intervals.   Basic metabolic panel     Status: Abnormal   Collection Time: 03/06/14  3:52 AM  Result Value Ref Range   Sodium 145 137 - 147 mEq/L   Potassium 3.6 (L) 3.7 - 5.3 mEq/L   Chloride 108 96 - 112 mEq/L   CO2 23 19 - 32 mEq/L   Glucose, Bld 104 (H) 70 - 99 mg/dL   BUN 14 6 - 23 mg/dL   Creatinine, Ser 0.76 0.50 - 1.10 mg/dL   Calcium 9.0 8.4 - 10.5 mg/dL   GFR calc non Af Amer 85 (L) >90 mL/min   GFR calc Af Amer >90 >90 mL/min    Comment: (NOTE) The eGFR has been calculated using the CKD EPI equation. This calculation has not been validated in all clinical situations. eGFR's persistently <90 mL/min signify possible Chronic Kidney Disease.    Anion gap 14 5 - 15  CBC     Status: Abnormal   Collection Time: 03/06/14  3:52 AM  Result Value Ref Range   WBC 5.7 4.0 - 10.5 K/uL   RBC 3.62 (L) 3.87 - 5.11 MIL/uL   Hemoglobin 10.6 (L) 12.0 - 15.0 g/dL   HCT 33.4 (L) 36.0 - 46.0 %   MCV 92.3 78.0 - 100.0 fL   MCH 29.3 26.0 - 34.0 pg   MCHC 31.7 30.0 - 36.0 g/dL   RDW 14.0 11.5 - 15.5 %   Platelets 326 150 - 400 K/uL    IMAGING: Dg Chest 2 View  03/05/2014   CLINICAL DATA:  Initial encounter for acute right chest and site pain for 2-3 days, posterior right shoulder pain also, personal history of hypertension  EXAM: CHEST  2 VIEW  COMPARISON:  03/01/2014  FINDINGS: Stable mild cardiac enlargement. Vascular pattern normal. Cardiac pacer in unchanged position.  Stable minimal interstitial changes at both lung  bases suggesting mild atelectasis or scarring. No consolidation or effusion.  IMPRESSION: No acute cardiopulmonary process.   Electronically Signed   By: Skipper Cliche M.D.   On: 03/05/2014 17:00   Dg Shoulder Right  03/05/2014   CLINICAL DATA:  Right chest pain for 2-3 days. Posterior shoulder pain as well.  EXAM: RIGHT SHOULDER - 2+ VIEW  COMPARISON:  03/05/2014 Chest radiograph  FINDINGS: There is no evidence of fracture or dislocation. There is no evidence of arthropathy or other focal bone abnormality. Soft tissues are unremarkable.  IMPRESSION: Negative.   Electronically Signed  By: Sherryl Barters M.D.   On: 03/05/2014 17:00   Ct Angio Chest Pe W/cm &/or Wo Cm  03/05/2014   CLINICAL DATA:  Chest pain, mid back pain, shortness of breath with exertion, elevated D-dimer, recent pacemaker placement, history hypertension, smoking, coronary artery disease post stenting, arrhythmia  EXAM: CT ANGIOGRAPHY CHEST WITH CONTRAST  TECHNIQUE: Multidetector CT imaging of the chest was performed using the standard protocol during bolus administration of intravenous contrast. Multiplanar CT image reconstructions and MIPs were obtained to evaluate the vascular anatomy.  CONTRAST:  125m OMNIPAQUE IOHEXOL 350 MG/ML SOLN IV  COMPARISON:  06/13/2008  FINDINGS: Beam hardening artifacts from pacemaker generator at upper LEFT anterior chest.  Scattered atherosclerotic calcifications aorta and coronary arteries.  No aortic aneurysm or dissection.  Pulmonary arteries well opacified and patent.  No evidence of pulmonary embolism.  No thoracic adenopathy.  Pacemaker leads in RIGHT atrium and RIGHT ventricle.  Cyst within RIGHT lobe liver 18 x 13 mm.  Cyst at medial aspect upper pole LEFT kidney incompletely seen, 2.6 x 2.5 cm image 263.  Remaining visualized upper abdomen unremarkable  Emphysematous changes with minimal dependent atelectasis in the posterior lower lobes.  Minimal subpleural likely chronic interstitial disease  BILATERAL upper lobes, stable on LEFT.  Large area of confluent airspace consolidation involving the RIGHT upper lobe on the previous exam has cleared.  No acute infiltrate, pleural effusion, pneumothorax, or definite mass/nodule.  No acute osseous findings.  Review of the MIP images confirms the above findings.  IMPRESSION: No evidence of pulmonary embolism.  Minimal dependent atelectasis at lower lobes.  Hepatic and LEFT renal cysts.  No definite acute intra thoracic abnormalities.   Electronically Signed   By: MLavonia DanaM.D.   On: 03/05/2014 18:54   [place]domen Limited Ruq  03/05/2014   CLINICAL DATA:  RIGHT shoulder pain, abdominal pain, history hypertension, coronary artery disease, pacemaker  EXAM: [place]DOMEN LIMITED - RIGHT UPPER QUADRANT  COMPARISON:  None  FINDINGS: Gallbladder:  Normally distended without stones or wall thickening.  No pericholecystic fluid or sonographic Murphy sign.  Common bile duct:  Diameter: Normal caliber 4 mm diameter  Liver:  Normal echogenicity. Small cyst in RIGHT lobe 1.6 x 1.5 x 1.4 cm. No additional mass lesions. No intrahepatic biliary dilatation. Hepatopetal portal venous flow.  No RIGHT upper quadrant free fluid.  IMPRESSION: Small hepatic cyst.  Otherwise normal exam.   Electronically Signed   By: MLavonia DanaM.D.   On: 03/05/2014 20:03    HOSPITAL DIAGNOSES: Principal Problem:   Chest pain Active Problems:   Coronary artery disease, hx of stents to RCA   Sick sinus syndrome   Monoclonal gammopathy of undetermined significance   HLD (hyperlipidemia)   Hypertensive cardiovascular disease   IMPRESSION: 1. Atypical chest pain 2. Left shoulder pain - no abnormalities on chest CT or abdominal ultrasound  RECOMMENDATION: 1. [place] is describing substernal chest pain as well as upper abdominal pain and pain behind her right shoulder blade. She did undergo abdominal ultrasound yesterday which showed a normal gallbladder. CT scan of the chest was  unremarkable. She does have a coronary disease history with prior stents in 2012. Although the pain in her right shoulder blade is reproducible and atypical for cardiac pain she does have persistent upper abdominal pain and chest pain. She ruled out for MI overnight. No EKG changes concerning for ischemia. I would recommend a nuclear stress test today. If negative, she may benefit from anti-inflammatory pain  medications and can of further workup as an outpatient.  Time Spent Directly with Patient: 30 minutes  Pixie Casino, MD, [facility] Attending Cardiologist [facility]  Luisantonio Adinolfi C 03/06/2014, 7:45 AM

## 2014-03-06 NOTE — Progress Notes (Signed)
Pt in Radiology dept for stress test - has been nauseated since in dept - have given Zofran IV 4 mg and phenergan 6.25 mg IV - which has helped some, but not allieviated the nausea completely.  Informed Rey, pt's RN on 3W.

## 2014-03-19 ENCOUNTER — Telehealth: Payer: Self-pay | Admitting: Cardiology

## 2014-03-19 NOTE — Telephone Encounter (Signed)
New Msg   Vm left for pt at home number about flu clinic.

## 2014-03-27 ENCOUNTER — Encounter (HOSPITAL_COMMUNITY): Payer: Self-pay | Admitting: Cardiovascular Disease

## 2014-03-31 ENCOUNTER — Telehealth: Payer: Self-pay | Admitting: Cardiology

## 2014-03-31 ENCOUNTER — Ambulatory Visit: Payer: PRIVATE HEALTH INSURANCE | Admitting: Cardiology

## 2014-03-31 ENCOUNTER — Other Ambulatory Visit: Payer: PRIVATE HEALTH INSURANCE

## 2014-03-31 NOTE — Telephone Encounter (Signed)
Spoke with patient and she only tool her Losartan for about 5 days after her last ov secondary to it "not agreeing with her" Patient sated she was dizzy and just did feel good when she took medication Patient cancelled follow up ov with  Dr. Mare Ferrari Scheduled appointment for patient to be seen by  Dr. Mare Ferrari tomorrow

## 2014-03-31 NOTE — Telephone Encounter (Signed)
Pt calling for [place], has question re BP medication, pls call [phone]

## 2014-04-01 ENCOUNTER — Ambulatory Visit (INDEPENDENT_AMBULATORY_CARE_PROVIDER_SITE_OTHER): Payer: PRIVATE HEALTH INSURANCE | Admitting: Cardiology

## 2014-04-01 ENCOUNTER — Telehealth: Payer: Self-pay | Admitting: Cardiology

## 2014-04-01 ENCOUNTER — Encounter: Payer: Self-pay | Admitting: Cardiology

## 2014-04-01 VITALS — BP 120/62 | HR 66 | Ht 65.0 in | Wt 132.0 lb

## 2014-04-01 DIAGNOSIS — I119 Hypertensive heart disease without heart failure: Secondary | ICD-10-CM

## 2014-04-01 DIAGNOSIS — I447 Left bundle-branch block, unspecified: Secondary | ICD-10-CM

## 2014-04-01 DIAGNOSIS — I2510 Atherosclerotic heart disease of native coronary artery without angina pectoris: Secondary | ICD-10-CM

## 2014-04-01 DIAGNOSIS — D649 Anemia, unspecified: Secondary | ICD-10-CM | POA: Insufficient documentation

## 2014-04-01 DIAGNOSIS — I2583 Coronary atherosclerosis due to lipid rich plaque: Secondary | ICD-10-CM

## 2014-04-01 DIAGNOSIS — I259 Chronic ischemic heart disease, unspecified: Secondary | ICD-10-CM

## 2014-04-01 DIAGNOSIS — Z950 Presence of cardiac pacemaker: Secondary | ICD-10-CM

## 2014-04-01 LAB — BASIC METABOLIC PANEL
BUN: 18 mg/dL (ref 6–23)
CO2: 26 meq/L (ref 19–32)
Calcium: 9.5 mg/dL (ref 8.4–10.5)
Chloride: 102 mEq/L (ref 96–112)
Creatinine, Ser: 0.9 mg/dL (ref 0.4–1.2)
GFR: 70.86 mL/min (ref >60.00)
GLUCOSE: 105 mg/dL — AB (ref 70–99)
Potassium: 3.6 mEq/L (ref 3.5–5.1)
SODIUM: 136 meq/L (ref 135–145)

## 2014-04-01 LAB — CBC WITH DIFFERENTIAL/PLATELET
BASOS PCT: 0.8 % (ref 0.0–3.0)
Basophils Absolute: 0.1 10*3/uL (ref 0.0–0.1)
EOS PCT: 3.3 % (ref 0.0–5.0)
Eosinophils Absolute: 0.3 10*3/uL (ref 0.0–0.7)
HCT: 35 % — ABNORMAL LOW (ref 36.0–46.0)
Hemoglobin: 11.4 g/dL — ABNORMAL LOW (ref 12.0–15.0)
LYMPHS ABS: 2.1 10*3/uL (ref 0.7–4.0)
Lymphocytes Relative: 26.8 % (ref 12.0–46.0)
MCHC: 32.5 g/dL (ref 30.0–36.0)
MCV: 91.2 fl (ref 78.0–100.0)
MONO ABS: 0.7 10*3/uL (ref 0.1–1.0)
MONOS PCT: 8.8 % (ref 3.0–12.0)
Neutro Abs: 4.8 10*3/uL (ref 1.4–7.7)
Neutrophils Relative %: 60.3 % (ref 43.0–77.0)
PLATELETS: 439 10*3/uL — AB (ref 150.0–400.0)
RBC: 3.84 Mil/uL — ABNORMAL LOW (ref 3.87–5.11)
RDW: 14.1 % (ref 11.5–15.5)
WBC: 7.9 10*3/uL (ref 4.0–10.5)

## 2014-04-01 MED ORDER — LOSARTAN POTASSIUM 25 MG PO TABS: 25.0000 mg | ORAL_TABLET | Freq: Every day | ORAL | Status: DC

## 2014-04-01 NOTE — Assessment & Plan Note (Signed)
The patient has not been experiencing any recurrent angina pectoris. 

## 2014-04-01 NOTE — Assessment & Plan Note (Signed)
She has a dual-chamber pacemaker.  She is not having any further episodes of dizziness or syncope

## 2014-04-01 NOTE — Assessment & Plan Note (Signed)
She has a past history of low hemoglobin.  We are updating her hemoglobin with a CBC today and also checking her busy metabolic panel.

## 2014-04-01 NOTE — Telephone Encounter (Signed)
Spoke with patient and she just wanted to let us know she is just going to start the Losartan 25 mg daily tablets daily, not 1/2 of the 50 mg tablets

## 2014-04-01 NOTE — Progress Notes (Signed)
Katherine [name] Date of Birth:  1945/07/30 [facility] [address] [place], [place]  [address] [phone]        Fax   [phone]   History of Present Illness: This pleasant 68 year old woman is seen for a scheduled followup office visit. She was admitted on 10/16/12 to [facility] with chest pain. She had cardiac catheterization on 10/17/12 showing significant 1 vessel coronary disease with patent stents in the RCA. No evidence of obstructive disease. Left ventricular function was normal. She has a history of known left bundle branch block. She has had known ischemic heart disease. She had her initial PCI on 05/14/10 at which time she was found to have a high-grade lesion in the midportion of the right coronary artery. She had had a normal nuclear stress test 3 days prior to her successful PCI. She has 2 overlapping drug-eluting stents and was on Effient for a year and now is on 81 mg aspirin alone. The patient had started smoking again prior to her recent hospital stay. She is now off cigarettes entirely once again She has a remote history of pneumonia for which she was hospitalized.  The patient had an echocardiogram on 10/18/12 which showed normal LV systolic function, aortic valve sclerosis without stenosis, and moderate TR. The patient also underwent carotid Dopplers on 10/18/12 which showed no obstruction.  On 11/14/13 the patient presented with marked bradycardia with transient AV block.  She had an underlying left bundle-branch block.  She underwent Medtronic dual pacer insertion by Dr. Rayann Heman. On 02/28/14 the patient went to the emergency room because of right sided chest pain.  EKG showed atrial paced rhythm and no acute changes.  Chest x-ray was unremarkable.  It was felt that she was having musculoskeletal chest wall pain.  She was prescribed Flexeril by the emergency room staff. Since last visit the patient denies any recurrent chest pain.   Current Outpatient  Prescriptions  Medication Sig Dispense Refill  . amLODipine (NORVASC) 10 MG tablet Take 1 tablet (10 mg total) by mouth daily. 30 tablet 3  . Ascorbic Acid (VITAMIN C) 500 MG tablet Take 250 mg by mouth daily.     [name] aspirin 81 MG tablet Take 81 mg by mouth daily.     [name] CALCIUM PO Take 1 capsule by mouth daily.    . Cholecalciferol (VITAMIN D PO) Take 1 tablet by mouth daily.    . hydrochlorothiazide (MICROZIDE) 12.5 MG capsule Take 12.5 mg by mouth daily.    [name] ibuprofen (ADVIL,MOTRIN) 600 MG tablet Take 1 tablet (600 mg total) by mouth every 6 (six) hours as needed. 30 tablet 0  . losartan (COZAAR) 25 MG tablet Take 1 tablet (25 mg total) by mouth daily. 30 tablet 5  . meclizine (ANTIVERT) 12.5 MG tablet Take 1 tablet (12.5 mg total) by mouth 3 (three) times daily as needed for dizziness. 30 tablet 0  . nitroGLYCERIN (NITROSTAT) 0.4 MG SL tablet Place 0.4 mg under the tongue every 5 (five) minutes as needed for chest pain (MAX 3 TABLETS).     . traMADol (ULTRAM) 50 MG tablet Take 1 tablet (50 mg total) by mouth every 6 (six) hours as needed. 30 tablet 0   No current facility-administered medications for this visit.    Allergies  Allergen Reactions  . Isosorbide Swelling    SWELLING OF THE TONGUE - pt states she received this after PNA ? But has been able to tolerate SL  nitroglycerin without adverse reaction.  . Metoprolol Succinate [Metoprolol] Other (See Comments)    Dizziness, diarrhea, tingling in fingers  . Zocor [Simvastatin] Other (See Comments)    Leg pain - does not tolerate high doses of statins  . Ezetimibe Other (See Comments)    unknown  . Plavix [Clopidogrel Bisulfate] Other (See Comments)    Prior P2Y12 showing 267 (poor Plavix responsiveness)  . Sulfa Antibiotics Other (See Comments)    Sulfa drugs as a child. UNKNOWN REACTION  . Sulfonamide Derivatives Other (See Comments)    Sulfa drugs as a child. UNKNOWN REACTION    Patient Active Problem List   Diagnosis Date  Noted  . Anginal pain     Priority: Medium  . Nosebleed     Priority: Medium  . History of hypercholesterolemia     Priority: Medium  . Tobacco abuse     Priority: Medium  . Low hemoglobin 04/01/2014  . History of echocardiogram   . Sick sinus syndrome   . Carotid artery disease   . Monoclonal gammopathy of undetermined significance   . HLD (hyperlipidemia)   . Hypertensive cardiovascular disease   . Back pain 11/13/2013  . Angina at rest 11/13/2013  . Symptomatic sinus bradycardia 11/13/2013  . Aortic valve sclerosis 07/10/2013  . Chest pain 10/18/2012  . Sinus bradycardia 10/18/2012  . Dizzy spells 10/18/2012  . PVC's (premature ventricular contractions) 09/25/2012  . Coronary artery disease, hx of stents to RCA   . 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 Left bundle-branch block     History  Smoking status  . Former Smoker  Smokeless tobacco  . Not on file    Comment: Smoking for over 45 years    History  Alcohol Use No    Family History  Problem Relation Age of Onset  . Cancer Father   . Heart disease Father     Review of Systems: Constitutional: no fever chills diaphoresis or fatigue or change in weight.  Head and neck: no hearing loss, no epistaxis, no photophobia or visual disturbance. Respiratory: No cough, shortness of breath or wheezing. Cardiovascular: No chest pain peripheral edema, palpitations. Gastrointestinal: No abdominal distention, no abdominal pain, no change in bowel habits hematochezia or melena. Genitourinary: No dysuria, no frequency, no urgency, no nocturia. Musculoskeletal:No arthralgias, no back pain, no gait disturbance or myalgias. Neurological: No dizziness, no headaches, no numbness, no seizures, no syncope, no weakness, no tremors. Hematologic: No lymphadenopathy, no easy bruising. Psychiatric: No confusion, no hallucinations, no sleep disturbance.    Physical Exam: Filed Vitals:   04/01/14 1448  BP:  120/62  Pulse: 66   the general appearance reveals a well-developed well-nourished woman in no distress.The head and neck exam reveals pupils equal and reactive.  Extraocular movements are full.  There is no scleral icterus.  The mouth and pharynx are normal.  The neck is supple.  The carotids reveal no bruits.  The jugular venous pressure is normal.  The  thyroid is not enlarged.  There is no lymphadenopathy.  The chest is clear to percussion and auscultation.  There are no rales or rhonchi.  Expansion of the chest is symmetrical.  The precordium is quiet.  The first heart sound is normal.  The second heart sound is physiologically split.  There is grade 1/6 systolic ejection murmur at the base.  There is no abnormal lift or heave.  The abdomen is soft and nontender.  The bowel sounds are normal.  The liver and spleen are not enlarged.  There are no abdominal masses.  There are no abdominal bruits.  Extremities reveal good pedal pulses.  There is no phlebitis or edema.  There is no cyanosis or clubbing.  Strength is normal and symmetrical in all extremities.  There is no lateralizing weakness.  There are no sensory deficits.  The skin is warm and dry.  There is no rash.  EKG shows atrial paced ventricular sensed rhythm.  She has an underlying left bundle branch block.   Assessment / Plan: 1.  Ischemic heart disease status post PCI of right coronary artery on 05/14/10 2. aortic valve sclerosis without stenosis and moderate TR 3. Hypercholesterolemia 4. essential hypertension without heart failure 5. Musculoskeletal chest wall pain 6.  Memory problems  Disposition: She has not been taking her losartan.  She was concerned because the milligrams were higher than she wished to take.  We had prescribed 50 mg.  We will reduce the dose to just 25 mg that she will take it.  Also continue to take amlodipine 10 mg daily. Blood work today pending.  We are checking CBC and basal metabolic panel.  Recheck in 3  months for office visit.

## 2014-04-01 NOTE — Progress Notes (Signed)
Quick Note:  Please report to patient. The recent labs are stable. Continue same medication and careful diet. ______ 

## 2014-04-01 NOTE — Telephone Encounter (Signed)
New Msg   Pt calling and has questions about medications. Please call.

## 2014-04-01 NOTE — Patient Instructions (Addendum)
Will obtain labs today and call you with the results (cbc/bmet)  START LOSARTAN 50 MG 1/2 TABLET DAILY. WHEN YOU USE UP YOUR CURRENT SUPPLY, A NEW RX FOR 25 MG TABLETS HAS BEEN SENT TO [facility]. WHEN YOU PICK UP NEW RX YOU WILL TAKE JUST 1 TABLET DAILY   DO NOT TAKE MORE THAN 1 AMLODIPINE A DAY   Your physician recommends that you schedule a follow-up appointment in: 3 month ov

## 2014-05-14 ENCOUNTER — Telehealth: Payer: Self-pay | Admitting: Cardiology

## 2014-05-14 ENCOUNTER — Encounter: Payer: PRIVATE HEALTH INSURANCE | Admitting: *Deleted

## 2014-05-14 NOTE — Telephone Encounter (Signed)
Spoke with pt and reminded pt of remote transmission that is due today. Pt verbalized understanding.   

## 2014-05-15 ENCOUNTER — Encounter: Payer: Self-pay | Admitting: Cardiology

## 2014-05-20 ENCOUNTER — Ambulatory Visit (INDEPENDENT_AMBULATORY_CARE_PROVIDER_SITE_OTHER): Payer: Medicare Other | Admitting: *Deleted

## 2014-05-20 DIAGNOSIS — I495 Sick sinus syndrome: Secondary | ICD-10-CM

## 2014-05-20 LAB — MDC_IDC_ENUM_SESS_TYPE_REMOTE
Battery Impedance: 100 Ohm
Brady Statistic AP VP Percent: 4 %
Brady Statistic AP VS Percent: 75 %
Brady Statistic AS VP Percent: 2 %
Brady Statistic AS VS Percent: 19 %
Date Time Interrogation Session: 20160202163820
Lead Channel Impedance Value: 670 Ohm
Lead Channel Pacing Threshold Amplitude: 0.625 V
Lead Channel Pacing Threshold Pulse Width: 0.4 ms
Lead Channel Sensing Intrinsic Amplitude: 2.8 mV
Lead Channel Setting Sensing Sensitivity: 4 mV
MDC IDC MSMT BATTERY REMAINING LONGEVITY: 146 mo
MDC IDC MSMT BATTERY VOLTAGE: 2.79 V
MDC IDC MSMT LEADCHNL RA IMPEDANCE VALUE: 466 Ohm
MDC IDC MSMT LEADCHNL RA PACING THRESHOLD AMPLITUDE: 0.5 V
MDC IDC MSMT LEADCHNL RA PACING THRESHOLD PULSEWIDTH: 0.4 ms
MDC IDC SET LEADCHNL RA PACING AMPLITUDE: 2 V
MDC IDC SET LEADCHNL RV PACING AMPLITUDE: 2.5 V
MDC IDC SET LEADCHNL RV PACING PULSEWIDTH: 0.4 ms

## 2014-05-20 NOTE — Progress Notes (Signed)
Remote pacemaker transmission.   

## 2014-06-05 ENCOUNTER — Encounter: Payer: Self-pay | Admitting: Cardiology

## 2014-06-10 ENCOUNTER — Encounter: Payer: Self-pay | Admitting: Internal Medicine

## 2014-06-19 ENCOUNTER — Encounter: Payer: Self-pay | Admitting: Cardiology

## 2014-07-01 ENCOUNTER — Ambulatory Visit: Payer: PRIVATE HEALTH INSURANCE | Admitting: Cardiology

## 2014-08-19 ENCOUNTER — Telehealth: Payer: Self-pay | Admitting: Cardiology

## 2014-08-19 ENCOUNTER — Ambulatory Visit (INDEPENDENT_AMBULATORY_CARE_PROVIDER_SITE_OTHER): Payer: Medicare Other | Admitting: *Deleted

## 2014-08-19 ENCOUNTER — Encounter: Payer: Self-pay | Admitting: Internal Medicine

## 2014-08-19 DIAGNOSIS — I495 Sick sinus syndrome: Secondary | ICD-10-CM | POA: Diagnosis not present

## 2014-08-19 NOTE — Progress Notes (Signed)
Remote pacemaker transmission.   

## 2014-08-19 NOTE — Telephone Encounter (Signed)
Spoke with pt and reminded pt of remote transmission that is due today. Pt verbalized understanding.   

## 2014-08-23 LAB — CUP PACEART REMOTE DEVICE CHECK
Battery Remaining Longevity: 148 mo
Battery Voltage: 2.79 V
Brady Statistic AP VP Percent: 4 %
Brady Statistic AS VP Percent: 3 %
Date Time Interrogation Session: 20160503160359
Lead Channel Impedance Value: 547 Ohm
Lead Channel Impedance Value: 703 Ohm
Lead Channel Pacing Threshold Amplitude: 0.625 V
Lead Channel Pacing Threshold Amplitude: 0.625 V
Lead Channel Setting Pacing Amplitude: 2 V
Lead Channel Setting Pacing Amplitude: 2.5 V
Lead Channel Setting Sensing Sensitivity: 4 mV
MDC IDC MSMT BATTERY IMPEDANCE: 100 Ohm
MDC IDC MSMT LEADCHNL RA PACING THRESHOLD PULSEWIDTH: 0.4 ms
MDC IDC MSMT LEADCHNL RV PACING THRESHOLD PULSEWIDTH: 0.4 ms
MDC IDC SET LEADCHNL RV PACING PULSEWIDTH: 0.4 ms
MDC IDC STAT BRADY AP VS PERCENT: 76 %
MDC IDC STAT BRADY AS VS PERCENT: 17 %

## 2014-08-28 ENCOUNTER — Encounter: Payer: Self-pay | Admitting: Cardiology

## 2014-09-11 ENCOUNTER — Encounter: Payer: Self-pay | Admitting: Cardiology

## 2014-10-30 ENCOUNTER — Ambulatory Visit: Payer: Medicare Other | Admitting: Cardiology

## 2014-11-18 ENCOUNTER — Telehealth: Payer: Self-pay | Admitting: Cardiology

## 2014-11-18 ENCOUNTER — Encounter: Payer: Medicare Other | Admitting: *Deleted

## 2014-11-18 NOTE — Telephone Encounter (Signed)
LMOVM reminding pt to send remote transmission.   

## 2014-11-19 ENCOUNTER — Encounter: Payer: Self-pay | Admitting: Cardiology

## 2015-02-17 ENCOUNTER — Encounter: Payer: Self-pay | Admitting: *Deleted

## 2015-02-18 ENCOUNTER — Ambulatory Visit: Payer: Medicare Other | Admitting: Cardiology

## 2015-02-19 ENCOUNTER — Ambulatory Visit: Payer: Medicare Other | Admitting: Cardiology

## 2015-03-11 ENCOUNTER — Encounter: Payer: Self-pay | Admitting: Cardiology

## 2015-03-11 ENCOUNTER — Ambulatory Visit (INDEPENDENT_AMBULATORY_CARE_PROVIDER_SITE_OTHER): Payer: Medicare Other | Admitting: Cardiology

## 2015-03-11 VITALS — BP 150/80 | HR 80 | Ht 65.0 in | Wt 134.8 lb

## 2015-03-11 DIAGNOSIS — I119 Hypertensive heart disease without heart failure: Secondary | ICD-10-CM

## 2015-03-11 DIAGNOSIS — I2583 Coronary atherosclerosis due to lipid rich plaque: Principal | ICD-10-CM

## 2015-03-11 DIAGNOSIS — I2510 Atherosclerotic heart disease of native coronary artery without angina pectoris: Secondary | ICD-10-CM | POA: Diagnosis not present

## 2015-03-11 NOTE — Patient Instructions (Signed)
Medication Instructions:  Your physician recommends that you continue on your current medications as directed. Please refer to the Current Medication list given to you today.  Labwork: none  Testing/Procedures: none  Follow-Up: Your physician wants you to follow-up in: 6 month ov with Dr Shirley Friar will receive a reminder letter in the mail two months in advance. If you don't receive a letter, please call our office to schedule the follow-up appointment.  If you need a refill on your cardiac medications before your next appointment, please call your pharmacy.

## 2015-03-11 NOTE — Progress Notes (Signed)
Cardiology Office Note   Date:  03/11/2015   ID:  Katherine, [name] April 08, 1946, MRN [id number]  PCP:  No primary care provider on file.  Cardiologist: Darlin Coco MD  No chief complaint on file.     History of Present Illness: Katherine [name] is a 69 y.o. female who presents for 6 month follow-up visit.  She has had increasing problems with dementia and her husband came with her today This pleasant 69 year old woman is seen for a scheduled followup office visit. She was admitted on 10/16/12 to [facility] with chest pain. She had cardiac catheterization on 10/17/12 showing significant 1 vessel coronary disease with patent stents in the RCA. No evidence of obstructive disease. Left ventricular function was normal. She has a history of known left bundle branch block. She has had known ischemic heart disease. She had her initial PCI on 05/14/10 at which time she was found to have a high-grade lesion in the midportion of the right coronary artery. She had had a normal nuclear stress test 3 days prior to her successful PCI. She has 2 overlapping drug-eluting stents and was on Effient for a year and now is on 81 mg aspirin alone. The patient had started smoking again prior to her recent hospital stay. She is now off cigarettes entirely once again.  Her husband states that she has not smoked for about the past 2 years.  She has a remote history of pneumonia for which she was hospitalized.  The patient had an echocardiogram on 10/18/12 which showed normal LV systolic function, aortic valve sclerosis without stenosis, and moderate TR. The patient also underwent carotid Dopplers on 10/18/12 which showed no obstruction.  On 11/14/13 the patient presented with marked bradycardia with transient AV block. She had an underlying left bundle-branch block. She underwent Medtronic dual pacer insertion by Dr. Rayann Heman. On 02/28/14 the patient went to the emergency room because of right sided chest pain. EKG  showed atrial paced rhythm and no acute changes. Chest x-ray was unremarkable. It was felt that she was having musculoskeletal chest wall pain. She was prescribed Flexeril by the emergency room staff. Since last visit the patient denies any recurrent chest pain.   Past Medical History  Diagnosis Date  . Coronary artery disease     a. s/p overlapping DES to RCA 04/2010 (days after neg stress test). b. Cath 06/2010 - patent stents, nonobst dz otherwise; c. 10/2012 Cath: LM nl, LAD 17m, D1/2/3 nl, LCX 20p, OM2 50p, RCA 20% ISR mid, EF 55-60% ->Med Rx.    [name] Hypertension   . HLD (hyperlipidemia)   . Left bundle-branch block   . Tobacco abuse   . Sinus bradycardia     a. In past prevented BB use.  . Monoclonal gammopathy of undetermined significance     a. Previously seen by heme - this is per chart but pt does not recall.  [name] PVC's (premature ventricular contractions)   . Carotid artery disease ([place])     a. Hx r carotid bruit - dopplers 2009 <50% bilat;  b. 10/2012 U/S: no hemodynamically signif stenosis.  . Sick sinus syndrome ([place])     a. s/p PPM on 11/14/13 with Medtronic Adapta L model ADDRL 1 (serial number [id number]) pacemaker  . History of echocardiogram     a. 2D ECHO: 11/14/2013: EF 60-65%, mild concentric LVH, no RWMAs. G1DD. Mod LA dilation, mild RA dilation, PA pressure 31.    Past Surgical History  Procedure Laterality Date  . Pci      HIGH GRADE LESION IN THE MIDPORTION OF THE RIGHT CORONARY ARTERY WHICH WAS A LARGE ARTERY  . Cardiac catheterization  05/14/10    SHOWED EVIDENCE OF REVERSIBLE VASOSPASM  . Pacemaker insertion  11/14/13    MDT Adapta L implanted by Dr Rayann Heman for Sick sinus syndrome  . Left heart catheterization with coronary angiogram N/A 10/17/2012    Procedure: LEFT HEART CATHETERIZATION WITH CORONARY ANGIOGRAM;  Surgeon: Wellington Hampshire, MD;  Location: [facility] CATH LAB;  Service: Cardiovascular;  Laterality: N/A;  . Permanent pacemaker insertion N/A 11/14/2013     Procedure: PERMANENT PACEMAKER INSERTION;  Surgeon: Coralyn Mark, MD;  Location: [facility] CATH LAB;  Service: Cardiovascular;  Laterality: N/A;     Current Outpatient Prescriptions  Medication Sig Dispense Refill  . amLODipine (NORVASC) 10 MG tablet Take 1 tablet (10 mg total) by mouth daily. 30 tablet 3  . Ascorbic Acid (VITAMIN C) 500 MG tablet Take 250 mg by mouth daily.     [name] aspirin 81 MG tablet Take 81 mg by mouth daily.     [name] CALCIUM PO Take 1 capsule by mouth daily.    . Cholecalciferol (VITAMIN D PO) Take 1 tablet by mouth daily.    . hydrochlorothiazide (MICROZIDE) 12.5 MG capsule Take 12.5 mg by mouth daily.    [name] ibuprofen (ADVIL,MOTRIN) 600 MG tablet Take 600 mg by mouth every 6 (six) hours as needed (pain).    [name] losartan (COZAAR) 25 MG tablet Take 1 tablet (25 mg total) by mouth daily. 30 tablet 5  . meclizine (ANTIVERT) 12.5 MG tablet Take 1 tablet (12.5 mg total) by mouth 3 (three) times daily as needed for dizziness. 30 tablet 0  . nitroGLYCERIN (NITROSTAT) 0.4 MG SL tablet Place 0.4 mg under the tongue every 5 (five) minutes as needed for chest pain (MAX 3 TABLETS).     . traMADol (ULTRAM) 50 MG tablet Take 50 mg by mouth every 6 (six) hours as needed (pain).     No current facility-administered medications for this visit.    Allergies:   Isosorbide; Metoprolol succinate; Zocor; Ezetimibe; Plavix; Sulfa antibiotics; and Sulfonamide derivatives    Social History:  The patient  reports that she has quit smoking. She does not have any smokeless tobacco history on file. She reports that she does not drink alcohol or use illicit drugs.   Family History:  The patient's family history includes Cancer in her father; Heart disease in her father.    ROS:  Please see the history of present illness.   Otherwise, review of systems are positive for none.   All other systems are reviewed and negative.    PHYSICAL EXAM: VS:  BP 150/80 mmHg  Pulse 80  Ht 5\' 5"  (1.651 m)  Wt 134 lb  12.8 oz (61.145 kg)  BMI 22.43 kg/m2  SpO2 97% , BMI Body mass index is 22.43 kg/(m^2). GEN: Well nourished, well developed, in no acute distress HEENT: normal Neck: no JVD, carotid bruits, or masses Cardiac: RRR; no murmurs, rubs, or gallops,no edema  Respiratory:  clear to auscultation bilaterally, normal work of breathing.  Pacemaker in left upper chest. GI: soft, nontender, nondistended, + BS MS: no deformity or atrophy Skin: warm and dry, no rash Neuro:  Strength and sensation are intact Psych: euthymic mood, full affect   EKG:  EKG is ordered today. The ekg ordered today demonstrates atrial paced rhythm left axis deviation with nonspecific  intraventricular block area since previous tracing of 04/01/14, no significant change.  Occasional PACs are seen.   Recent Labs: 04/01/2014: BUN 18; Creatinine, Ser 0.9; Hemoglobin 11.4*; Platelets 439.0*; Potassium 3.6; Sodium 136    Lipid Panel    Component Value Date/Time   CHOL 210* 11/14/2013 0548   TRIG 117 11/14/2013 0548   HDL 53 11/14/2013 0548   CHOLHDL 4.0 11/14/2013 0548   VLDL 23 11/14/2013 0548   LDLCALC 134* 11/14/2013 0548   LDLDIRECT 156.1 06/12/2013 1233      Wt Readings from Last 3 Encounters:  03/11/15 134 lb 12.8 oz (61.145 kg)  04/01/14 132 lb (59.875 kg)  03/06/14 134 lb (60.782 kg)         ASSESSMENT AND PLAN:  1. Ischemic heart disease status post PCI of right coronary artery on 05/14/10 2. aortic valve sclerosis without stenosis and moderate TR 3. Hypercholesterolemia 4. essential hypertension without heart failure 5. Musculoskeletal chest wall pain 6. Memory problems 7.  Sick sinus syndrome with functioning pacemaker   Current medicines are reviewed at length with the patient today.  The patient does not have concerns regarding medicines.  The following changes have been made:  no change  Labs/ tests ordered today include:   Orders Placed This Encounter  Procedures  . EKG 12-Lead     Disposition: Continue current medication.  Continue to be a nonsmoker.  Recheck in 6 months for follow-up office visit.  Patient requests to see Dr. Angelena Form after my retirement.  Berna Spare MD 03/11/2015 5:13 PM    [facility] [place], [place], [place]  [address] Phone: [phone]; Fax: [phone]

## 2015-03-30 ENCOUNTER — Ambulatory Visit (INDEPENDENT_AMBULATORY_CARE_PROVIDER_SITE_OTHER): Payer: Medicare Other | Admitting: Internal Medicine

## 2015-03-30 ENCOUNTER — Encounter: Payer: Self-pay | Admitting: Internal Medicine

## 2015-03-30 VITALS — BP 132/72 | HR 82 | Ht 65.0 in | Wt 136.2 lb

## 2015-03-30 DIAGNOSIS — I480 Paroxysmal atrial fibrillation: Secondary | ICD-10-CM

## 2015-03-30 DIAGNOSIS — R001 Bradycardia, unspecified: Secondary | ICD-10-CM | POA: Diagnosis not present

## 2015-03-30 DIAGNOSIS — I119 Hypertensive heart disease without heart failure: Secondary | ICD-10-CM

## 2015-03-30 DIAGNOSIS — I495 Sick sinus syndrome: Secondary | ICD-10-CM

## 2015-03-30 HISTORY — DX: Paroxysmal atrial fibrillation: I48.0

## 2015-03-30 LAB — CUP PACEART INCLINIC DEVICE CHECK
Battery Impedance: 100 Ohm
Battery Voltage: 2.79 V
Brady Statistic AP VP Percent: 4 %
Brady Statistic AP VS Percent: 74 %
Brady Statistic AS VP Percent: 2 %
Brady Statistic AS VS Percent: 20 %
Implantable Lead Implant Date: 20150730
Implantable Lead Location: 753860
Implantable Lead Model: 5076
Implantable Lead Model: 5076
Lead Channel Impedance Value: 622 Ohm
Lead Channel Pacing Threshold Amplitude: 0.5 V
Lead Channel Pacing Threshold Pulse Width: 0.4 ms
Lead Channel Sensing Intrinsic Amplitude: 11.2 mV
Lead Channel Sensing Intrinsic Amplitude: 2.8 mV
Lead Channel Setting Pacing Pulse Width: 0.4 ms
MDC IDC LEAD IMPLANT DT: 20150730
MDC IDC LEAD LOCATION: 753859
MDC IDC MSMT BATTERY REMAINING LONGEVITY: 146 mo
MDC IDC MSMT LEADCHNL RA IMPEDANCE VALUE: 460 Ohm
MDC IDC MSMT LEADCHNL RV PACING THRESHOLD AMPLITUDE: 0.75 V
MDC IDC MSMT LEADCHNL RV PACING THRESHOLD PULSEWIDTH: 0.4 ms
MDC IDC SESS DTM: 20161212172847
MDC IDC SET LEADCHNL RA PACING AMPLITUDE: 2 V
MDC IDC SET LEADCHNL RV PACING AMPLITUDE: 2.5 V
MDC IDC SET LEADCHNL RV SENSING SENSITIVITY: 4 mV

## 2015-03-30 NOTE — Progress Notes (Signed)
Primary Cardiologist:  Dr Roswell Nickel is a 69 y.o. female who presents today for routine electrophysiology followup.  Since her last visit, the patient reports doing very well.  Today, she denies symptoms of palpitations, chest pain, shortness of breath,  lower extremity edema, dizziness, presyncope, or syncope.  The patient is otherwise without complaint today.   Past Medical History  Diagnosis Date  . Coronary artery disease     a. s/p overlapping DES to RCA 04/2010 (days after neg stress test). b. Cath 06/2010 - patent stents, nonobst dz otherwise; c. 10/2012 Cath: LM nl, LAD 57m, D1/2/3 nl, LCX 20p, OM2 50p, RCA 20% ISR mid, EF 55-60% ->Med Rx.    [name] Hypertension   . HLD (hyperlipidemia)   . Left bundle-branch block   . Tobacco abuse   . Monoclonal gammopathy of undetermined significance     a. Previously seen by heme - this is per chart but pt does not recall.  [name] PVC's (premature ventricular contractions)   . Carotid artery disease ([place])     a. Hx r carotid bruit - dopplers 2009 <50% bilat;  b. 10/2012 U/S: no hemodynamically signif stenosis.  . Sick sinus syndrome ([place])     a. s/p PPM on 11/14/13 with Medtronic Adapta L model ADDRL 1 (serial number [id number]) pacemaker  . History of echocardiogram     a. 2D ECHO: 11/14/2013: EF 60-65%, mild concentric LVH, no RWMAs. G1DD. Mod LA dilation, mild RA dilation, PA pressure 31.  . Paroxysmal atrial fibrillation ([place]) 03/30/2015    observed on pacemaker interrogation, longest episode 2 minutes   Past Surgical History  Procedure Laterality Date  . Pci      HIGH GRADE LESION IN THE MIDPORTION OF THE RIGHT CORONARY ARTERY WHICH WAS A LARGE ARTERY  . Cardiac catheterization  05/14/10    SHOWED EVIDENCE OF REVERSIBLE VASOSPASM  . Pacemaker insertion  11/14/13    MDT Adapta L implanted by Dr Rayann Heman for Sick sinus syndrome  . Left heart catheterization with coronary angiogram N/A 10/17/2012    Procedure: LEFT HEART CATHETERIZATION WITH  CORONARY ANGIOGRAM;  Surgeon: Wellington Hampshire, MD;  Location: [facility] CATH LAB;  Service: Cardiovascular;  Laterality: N/A;  . Permanent pacemaker insertion N/A 11/14/2013    Procedure: PERMANENT PACEMAKER INSERTION;  Surgeon: Coralyn Mark, MD;  Location: [facility] CATH LAB;  Service: Cardiovascular;  Laterality: N/A;    ROS- all systems are reviewed and negative except as per HPI above  Current Outpatient Prescriptions  Medication Sig Dispense Refill  . amLODipine (NORVASC) 10 MG tablet Take 1 tablet (10 mg total) by mouth daily. 30 tablet 3  . Ascorbic Acid (VITAMIN C) 500 MG tablet Take 250 mg by mouth daily.     [name] aspirin 81 MG tablet Take 81 mg by mouth daily.     . Cholecalciferol (VITAMIN D PO) Take 1 tablet by mouth daily.    [name] ibuprofen (ADVIL,MOTRIN) 600 MG tablet Take 600 mg by mouth every 6 (six) hours as needed (pain).    . meclizine (ANTIVERT) 12.5 MG tablet Take 1 tablet (12.5 mg total) by mouth 3 (three) times daily as needed for dizziness. 30 tablet 0  . nitroGLYCERIN (NITROSTAT) 0.4 MG SL tablet Place 0.4 mg under the tongue every 5 (five) minutes as needed for chest pain (MAX 3 TABLETS).      No current facility-administered medications for this visit.    Physical Exam: Filed Vitals:   03/30/15 1556  BP: 132/72  Pulse: 82  Height: 5\' 5"  (1.651 m)  Weight: 136 lb 3.2 oz (61.78 kg)    GEN- The patient is well appearing, alert and oriented x 3 today.   Head- normocephalic, atraumatic Eyes-  Sclera clear, conjunctiva pink Ears- hearing intact Oropharynx- clear Lungs- Clear to ausculation bilaterally, normal work of breathing Chest- pacemaker pocket is well healed Heart- Regular rate and rhythm  GI- soft, NT, ND, + BS Extremities- no clubbing, cyanosis, or edema  Pacemaker interrogation- reviewed in detail today,  See PACEART report  Assessment and Plan:  1. Sick sinus syndrome Normal pacemaker function See Pace Art report No changes today  2. Hypertensive  cardiovascular disease Stable No change required today  3. CAD No ischemic symptoms  4. Paroxysmal atrial fibrillation Discovered on PPM interrogation, longest episode is 2 minutes We discussed afib today.  She is aware that if her AF burden increases she may require initiation of anticoagulation in the future.  She would prefer to avoid this if able.  Follow-up with Dr Mare Ferrari as scheduled carelink every 3 months Return to see EP NP in 12 months  Thompson Grayer MD, [facility] 03/30/2015 4:25 PM

## 2015-03-30 NOTE — Patient Instructions (Addendum)
Medication Instructions:  Your physician recommends that you continue on your current medications as directed. Please refer to the Current Medication list given to you today.   Labwork: None ordered   Testing/Procedures: None ordered   Follow-Up: Remote monitoring is used to monitor your Pacemaker from home. This monitoring reduces the number of office visits required to check your device to one time per year. It allows [place] to keep an eye on the functioning of your device to ensure it is working properly. You are scheduled for a device check from home on 07/03/15. You may send your transmission at any time that day. If you have a wireless device, the transmission will be sent automatically. After your physician reviews your transmission, you will receive a postcard with your next transmission date.  Your physician wants you to follow-up in: 12 months with Chanetta Marshall, NP You will receive a reminder letter in the mail two months in advance. If you don't receive a letter, please call our office to schedule the follow-up appointment.      Any Other Special Instructions Will Be Listed Below (If Applicable).     If you need a refill on your cardiac medications before your next appointment, please call your pharmacy.

## 2015-04-09 NOTE — Telephone Encounter (Signed)
error 

## 2015-04-29 ENCOUNTER — Other Ambulatory Visit: Payer: Self-pay | Admitting: Cardiology

## 2015-04-30 NOTE — Telephone Encounter (Signed)
Please advise on refill. Per last office visit this medication was discontinued with a reason of patient has not taken within the last thirty days. It does not look like she has had it refilled in a while. Thanks, MI

## 2015-05-04 ENCOUNTER — Telehealth: Payer: Self-pay

## 2015-05-04 NOTE — Telephone Encounter (Signed)
°*  STAT* If patient is at the pharmacy, call can be transferred to refill team.   1. Which medications need to be refilled? (please list name of each medication and dose if known) Losartan   2. Which pharmacy/location (including street and city if local pharmacy) is medication to be sent to? [facility] on [address]   3. Do they need a 30 day or 90 day supply? Not sure   Comments: Pharmacy states that they have not received the refill for this medication. Please call back to disucss

## 2015-05-04 NOTE — Telephone Encounter (Signed)
Follow up ° ° ° ° ° °Returning a call to the nurse °

## 2015-05-04 NOTE — Telephone Encounter (Signed)
Spoke with husband and patient has been taking Losartan all along Sent Rx to pharmacy as requested

## 2015-05-04 NOTE — Telephone Encounter (Signed)
Left message to call back, ? Taking this medication

## 2015-05-05 NOTE — Telephone Encounter (Signed)
Agree.  Continue Losartan.

## 2015-06-29 ENCOUNTER — Encounter: Payer: Medicare Other | Admitting: *Deleted

## 2015-06-29 ENCOUNTER — Ambulatory Visit (INDEPENDENT_AMBULATORY_CARE_PROVIDER_SITE_OTHER): Payer: Medicare Other | Admitting: Neurology

## 2015-06-29 ENCOUNTER — Telehealth: Payer: Self-pay | Admitting: Cardiology

## 2015-06-29 ENCOUNTER — Encounter: Payer: Self-pay | Admitting: Neurology

## 2015-06-29 VITALS — BP 144/82 | HR 56 | Resp 20 | Ht 65.0 in | Wt 140.0 lb

## 2015-06-29 DIAGNOSIS — F068 Other specified mental disorders due to known physiological condition: Secondary | ICD-10-CM | POA: Diagnosis not present

## 2015-06-29 DIAGNOSIS — F329 Major depressive disorder, single episode, unspecified: Secondary | ICD-10-CM | POA: Diagnosis not present

## 2015-06-29 DIAGNOSIS — I482 Chronic atrial fibrillation, unspecified: Secondary | ICD-10-CM | POA: Insufficient documentation

## 2015-06-29 DIAGNOSIS — F482 Pseudobulbar affect: Secondary | ICD-10-CM | POA: Diagnosis not present

## 2015-06-29 DIAGNOSIS — F0151 Vascular dementia with behavioral disturbance: Secondary | ICD-10-CM

## 2015-06-29 DIAGNOSIS — F0390 Unspecified dementia without behavioral disturbance: Secondary | ICD-10-CM

## 2015-06-29 DIAGNOSIS — F0153 Vascular dementia, unspecified severity, with mood disturbance: Secondary | ICD-10-CM

## 2015-06-29 MED ORDER — DONEPEZIL HCL 5 MG PO TABS: 5.0000 mg | ORAL_TABLET | Freq: Every day | ORAL | Status: DC

## 2015-06-29 NOTE — Progress Notes (Signed)
NEURO CLINIC   Provider:  Larey Seat, M D  Referring Provider: Thressa Sheller, MD Primary Care Physician:  Thressa Sheller, MD  Chief Complaint  Patient presents with  . New Patient (Initial Visit)    memory loss, rm 54, with husband and son, MMSE 13/30, CDT 0/3, AFT 4    HPI:  Katherine [name] is a 70 y.o. female , seen here as a referral from Dr. Noah Delaine for a dementia evaluation,   Chief complaint according to patient : " I forget what I just said or was told "/  Katherine [name] reported that he noted over a year ago that his wife sometimes repeatedly asked the same questions or seems to have forgotten parts of the conversation that just to place. She also seemed to be more anxious, she did not longer like to drive high ways  but she also did not like to be driven long distance. The patient never got lost, but she gave up driving about 3 months ago by herself. She had noticed that when she usually likes to go shopping she had no sometimes shortness of breath or a feeling of chest tightness. She has a history of coronary artery disease and has a pacemaker. She has been treated with angioplasty. She has atrial fibrillation, pacemaker controlled. Renal artery stenosis. Katherine [name] gave up balancing the check book, about 14 month ago.  She is a retired Chemical engineer. She no longer reads a newspaper. Motor function changed, her handwriting is impaired, closing a belt is difficult.  Katherine [name] also reports that she gets restful sleep and usually feels refreshed and restored in the morning, she does not report any nightmarish dreams. She has not acted out on dreams or had been known to have sundowning. Her husband has noted her to be more likely to be emotional when she watches TV or listens to music. She may tear up but she has never been sobbing crying loudly.  She was reluctant to perform a MMSE here in office.  MMSE - Mini Mental State Exam 06/29/2015  Orientation to time 1    Orientation to Place 3  Registration 3  Attention/ Calculation 0  Recall 0  Language- name 2 objects 2  Language- repeat 0  Language- follow 3 step command 3  Language- read & follow direction 1  Write a sentence 0  Copy design 0  Total score 13   Social history:  Married, one daughter, a Pharmacist, hospital in [place]- one son, Conservation officer, nature rep , living in [place].   Review of Systems: Out of a complete 14 system review, the patient complains of only the following symptoms, and all other reviewed systems are negative.  Social History   Social History  . Marital Status: Married    Spouse Name: N/A  . Number of Children: N/A  . Years of Education: N/A   Occupational History  . Not on file.   Social History Main Topics  . Smoking status: Former Research scientist (life sciences)  . Smokeless tobacco: Not on file     Comment: Smoking for over 45 years  . Alcohol Use: No  . Drug Use: No  . Sexual Activity: Not on file   Other Topics Concern  . Not on file   Social History Narrative    Family History  Problem Relation Age of Onset  . Cancer Father   . Heart disease Father     Past Medical History  Diagnosis Date  . Coronary artery disease  a. s/p overlapping DES to RCA 04/2010 (days after neg stress test). b. Cath 06/2010 - patent stents, nonobst dz otherwise; c. 10/2012 Cath: LM nl, LAD 28m, D1/2/3 nl, LCX 20p, OM2 50p, RCA 20% ISR mid, EF 55-60% ->Med Rx.    [name] Hypertension   . HLD (hyperlipidemia)   . Left bundle-branch block   . Tobacco abuse   . Monoclonal gammopathy of undetermined significance     a. Previously seen by heme - this is per chart but pt does not recall.  [name] PVC's (premature ventricular contractions)   . Carotid artery disease ([place])     a. Hx r carotid bruit - dopplers 2009 <50% bilat;  b. 10/2012 U/S: no hemodynamically signif stenosis.  . Sick sinus syndrome ([place])     a. s/p PPM on 11/14/13 with Medtronic Adapta L model ADDRL 1 (serial number [id number]) pacemaker  . History of  echocardiogram     a. 2D ECHO: 11/14/2013: EF 60-65%, mild concentric LVH, no RWMAs. G1DD. Mod LA dilation, mild RA dilation, PA pressure 31.  . Paroxysmal atrial fibrillation ([place]) 03/30/2015    observed on pacemaker interrogation, longest episode 2 minutes    Past Surgical History  Procedure Laterality Date  . Pci      HIGH GRADE LESION IN THE MIDPORTION OF THE RIGHT CORONARY ARTERY WHICH WAS A LARGE ARTERY  . Cardiac catheterization  05/14/10    SHOWED EVIDENCE OF REVERSIBLE VASOSPASM  . Pacemaker insertion  11/14/13    MDT Adapta L implanted by Dr Rayann Heman for Sick sinus syndrome  . Left heart catheterization with coronary angiogram N/A 10/17/2012    Procedure: LEFT HEART CATHETERIZATION WITH CORONARY ANGIOGRAM;  Surgeon: Wellington Hampshire, MD;  Location: [facility];  Service: Cardiovascular;  Laterality: N/A;  . Permanent pacemaker insertion N/A 11/14/2013    Procedure: PERMANENT PACEMAKER INSERTION;  Surgeon: Coralyn Mark, MD;  Location: [facility];  Service: Cardiovascular;  Laterality: N/A;    Current Outpatient Prescriptions  Medication Sig Dispense Refill  . Ascorbic Acid (VITAMIN C) 500 MG tablet Take 250 mg by mouth daily.     [name] aspirin 81 MG tablet Take 81 mg by mouth daily.    [name] ibuprofen (ADVIL,MOTRIN) 600 MG tablet Take 600 mg by mouth every 6 (six) hours as needed (pain).    [name] losartan (COZAAR) 25 MG tablet TAKE 1 TABLET BY MOUTH EVERY DAY 90 tablet 1  . nitroGLYCERIN (NITROSTAT) 0.4 MG SL tablet Place 0.4 mg under the tongue every 5 (five) minutes as needed for chest pain (MAX 3 TABLETS).      No current facility-administered medications for this visit.    Allergies as of 06/29/2015 - Review Complete 06/29/2015  Allergen Reaction Noted  . Isosorbide Swelling 07/26/2010  . Metoprolol succinate [metoprolol] Other (See Comments) 10/16/2012  . Zocor [simvastatin] Other (See Comments) 10/15/2012  . Ezetimibe Other (See Comments)   . Plavix [clopidogrel bisulfate] Other  (See Comments) 10/16/2012  . Sulfa antibiotics Other (See Comments) 10/16/2012  . Sulfonamide derivatives Other (See Comments)     Vitals: BP 144/82 mmHg  Pulse 56  Resp 20  Ht 5\' 5"  (1.651 m)  Wt 140 lb (63.504 kg)  BMI 23.30 kg/m2 Last Weight:  Wt Readings from Last 1 Encounters:  06/29/15 140 lb (63.504 kg)   [id number] mass index is 23.3 kg/(m^2).     Last Height:   Ht Readings from Last 1 Encounters:  06/29/15 5\' 5"  (1.651 m)  Physical exam:  General: The patient is awake, alert and appears not in acute distress. The patient is well groomed. Head: Normocephalic, atraumatic. Neck is supple. Cardiovascular:  irregular rate and rhythm with cardiac ejeis ction  murmur , no.me  carotid bruit, and without distended neck veins. Respiratory: Lungs are clear to auscultation. Skin:  Without evidence of edema, or rash Trunk: BMI is normal. The patient's posture is erect   Neurologic exam : The patient is awake and alert, oriented to place and time.   Memory subjective described as impaired Memory testing revealed :  MOCA:No flowsheet data found. MMSE: MMSE - Mini Mental State Exam 06/29/2015  Orientation to time 1  Orientation to Place 3  Registration 3  Attention/ Calculation 0  Recall 0  Language- name 2 objects 2  Language- repeat 0  Language- follow 3 step command 3  Language- read & follow direction 1  Write a sentence 0  Copy design 0  Total score 13    Attention span & concentration ability appears normal.  Speech is fluent,  Without dysarthria, dysphonia or aphasia.  Mood and affect are appropriate.  Cranial nerves: Pupils are equal and briskly reactive to light. Funduscopic exam without palor. She  has difficulties fallowing verbal instructions but aims to please.  NO evidence of pallor or edema. Extraocular movements  in vertical and horizontal planes intact and without nystagmus. Visual fields by finger perimetry are intact. Hearing to finger rub intact.  Facial sensation intact to fine touch.Facial motor strength is symmetric and tongue and uvula move midline. Shoulder shrug was symmetrical.   Motor exam:  Elevated  tone, normal muscle bulk and symmetric strength in all extremities. Brisk reflexes and gegenhalten, inability to relax.  Sensory:  Fine touch, pinprick and vibration were normal  Proprioception tested in the upper extremities was normal. Coordination: Rapid alternating movements in the fingers/hands was normal. Finger-to-nose maneuver with  evidence of ataxia, dysmetria , not  tremor. Gait and station: Patient walks without assistive device and is able unassisted to climb up to the exam table. Strength within normal limits.Stance is stable and normal.  Deep tendon reflexes: in the  upper and lower extremities are very brisk, but symmetric . Babinski maneuver response is downgoing.  The patient was advised of the nature of the diagnosed sleep disorder , the treatment options and risks for general a health and wellness arising from not treating the condition.  I spent more than 50 minutes of face to face time with the patient. Greater than 50% of time was spent in counseling and coordination of care. We have discussed the diagnosis and differential and I answered the patient's questions.     Assessment:  After physical and neurologic examination, review of laboratory studies,  Personal review of imaging studies, reports of other /same  Imaging studies ,  Results of polysomnography/ neurophysiology testing and pre-existing records as far as provided in visit., my assessment is   1)  The patients past medical history and comorbidities include atrial fibrillation, coronary artery disease, chronic renal impai doesn't necessarily rment 2, hypertension with renal origin suspected to be of renal artery stenosis. She also has a cardiac murmur which is an ejection murmur.   2) she has very brisk reflexes without clonus, especially in the lower  extremities. There is failure to relax in the upper extremities with gegenhalten and increased tone at baseline.  3) I was surprised about the score on her Mini-Mental Status Examination since during the interview I  had not the feeling that Katherine [name] is at significantly disoriented or unable to you to conversation. She denied any trauma, falls, focal weakness, focal visual changes or numbness but she has definitely had remarkable changes in her handwriting is sometimes more clumsy and has difficulties to comprehend verbal instructions. She is better at mimicking and action than following a verbal instruction. My first goal is to rule out a vascular form of memory loss given her risk factors. Since she is a pacemaker pt.  and she cannot undergo a magnetic resonance imaging study we will order a CT of the brain.  This will also help me to see if there is any focal atrophy.     Plan:  Treatment plan and additional workup :  Aricept. CT brain,  PT gait and balance evaluation. OT ordered.  RV in 3-4 weeks.     Asencion Partridge Duey Liller MD  06/29/2015   CC: Thressa Sheller, Md [address], [place], [place] [address]

## 2015-06-29 NOTE — Addendum Note (Signed)
Addended by: Larey Seat on: 06/29/2015 02:53 PM   Modules accepted: Orders

## 2015-06-29 NOTE — Patient Instructions (Signed)
Vascular Dementia Vascular dementia is a common cause of dementia in the elderly. Dementia is a condition that affects the brain and causes people to not think well or act normally. Vascular dementia is one type of dementia. It occurs when blood clots block small blood vessels in the brain and destroy brain tissue. Likely risk factors are high blood pressure and advanced age. This disease can cause stroke, migraine-like headaches, and psychiatric disturbances.  SYMPTOMS   Confusion.  Problems with recent memory.  Wandering or getting lost in familiar places.  Loss of bladder or bowel control (incontinence).  Unsteady gait.  Poor attention and concentration.  Emotional problems such as laughing or crying inappropriately.  Difficulty following instructions.  Problems handling money.  Depression.  Difficulty planning ahead. Usually the damage is slight at first. Over time, as more small vessels are blocked, there is a gradual mental decline. However, symptoms may begin suddenly. Symptoms may be very similar to Alzheimer's disease. The two forms of dementia may occur together. Vascular dementia typically begins between the ages of 60 and 75. It affects men more often than women. TREATMENT   Currently there is no treatment for vascular dementia that can reverse the damage that has already occurred.  Treatment focuses on prevention of additional brain damage and improvement of symptoms.  It is important to treat the risk factors for vascular dementia, such as keeping blood pressure under control, treating diabetes, lowering cholesterol, and stop smoking. PROGNOSIS   Prognosis for patients is generally poor. Individuals with the disease may improve for short periods of time, then get worse again. Early treatment and management of blood pressure and other risk factors may prevent further worsening of the disorder.   This information is not intended to replace advice given to you by your  health care provider. Make sure you discuss any questions you have with your health care provider.   Document Released: 03/25/2002 Document Revised: 06/27/2011 Document Reviewed: 07/16/2014 Elsevier Interactive Patient Education 2016 [employer]  

## 2015-06-29 NOTE — Telephone Encounter (Signed)
LMOVM reminding pt to send remote transmission.   

## 2015-06-29 NOTE — Addendum Note (Signed)
Addended by: Larey Seat on: 06/29/2015 02:54 PM   Modules accepted: Orders

## 2015-06-30 LAB — COMPREHENSIVE METABOLIC PANEL
ALBUMIN: 4.5 g/dL (ref 3.6–4.8)
ALK PHOS: 75 IU/L (ref 39–117)
ALT: 7 IU/L (ref 0–32)
AST: 21 IU/L (ref 0–40)
Albumin/Globulin Ratio: 1.4 (ref 1.2–2.2)
BUN / CREAT RATIO: 19 (ref 11–26)
BUN: 15 mg/dL (ref 8–27)
Bilirubin Total: 0.4 mg/dL (ref 0.0–1.2)
CO2: 22 mmol/L (ref 18–29)
Calcium: 9.9 mg/dL (ref 8.7–10.3)
Chloride: 103 mmol/L (ref 96–106)
Creatinine, Ser: 0.81 mg/dL (ref 0.57–1.00)
GFR calc Af Amer: 86 mL/min/{1.73_m2} (ref >59)
GFR calc non Af Amer: 75 mL/min/{1.73_m2} (ref >59)
GLOBULIN, TOTAL: 3.2 g/dL (ref 1.5–4.5)
GLUCOSE: 93 mg/dL (ref 65–99)
Potassium: 5 mmol/L (ref 3.5–5.2)
SODIUM: 142 mmol/L (ref 134–144)
Total Protein: 7.7 g/dL (ref 6.0–8.5)

## 2015-07-01 ENCOUNTER — Telehealth: Payer: Self-pay

## 2015-07-01 NOTE — Telephone Encounter (Signed)
I advised patient's husband that labs were normal per below message for the patient and she can proceed with the CT scan. He understood.

## 2015-07-01 NOTE — Telephone Encounter (Signed)
I called pt to advise her that her labs were normal and pt may proceed with CT scan. No answer, left a message asking her to call me back. If pt calls back, please advise her of this information.

## 2015-07-01 NOTE — Telephone Encounter (Signed)
-----   Message from Larey Seat, MD sent at 06/30/2015  5:25 PM EDT ----- Normal BMET, can have CT

## 2015-07-03 ENCOUNTER — Encounter: Payer: Self-pay | Admitting: Cardiology

## 2015-07-06 ENCOUNTER — Ambulatory Visit
Admission: RE | Admit: 2015-07-06 | Discharge: 2015-07-06 | Disposition: A | Discharge status: Home / Self Care | Payer: Medicare Other | Source: Ambulatory Visit | Attending: Neurology | Admitting: Neurology

## 2015-07-06 DIAGNOSIS — F0153 Vascular dementia, unspecified severity, with mood disturbance: Secondary | ICD-10-CM

## 2015-07-06 DIAGNOSIS — F482 Pseudobulbar affect: Secondary | ICD-10-CM

## 2015-07-06 DIAGNOSIS — F068 Other specified mental disorders due to known physiological condition: Secondary | ICD-10-CM | POA: Diagnosis not present

## 2015-07-06 DIAGNOSIS — F0390 Unspecified dementia without behavioral disturbance: Secondary | ICD-10-CM

## 2015-07-06 DIAGNOSIS — I482 Chronic atrial fibrillation, unspecified: Secondary | ICD-10-CM

## 2015-07-06 DIAGNOSIS — F0151 Vascular dementia with behavioral disturbance: Secondary | ICD-10-CM

## 2015-07-06 DIAGNOSIS — F329 Major depressive disorder, single episode, unspecified: Principal | ICD-10-CM

## 2015-07-06 MED ORDER — IOPAMIDOL (ISOVUE-300) INJECTION 61%
75.0000 mL | Freq: Once | INTRAVENOUS | Status: AC | PRN
  Administered 2015-07-06: 75 mL via INTRAVENOUS

## 2015-08-03 ENCOUNTER — Ambulatory Visit (INDEPENDENT_AMBULATORY_CARE_PROVIDER_SITE_OTHER): Payer: Medicare Other | Admitting: Neurology

## 2015-08-03 ENCOUNTER — Encounter: Payer: Self-pay | Admitting: Neurology

## 2015-08-03 VITALS — BP 162/90 | HR 86 | Resp 20 | Ht 65.0 in | Wt 139.0 lb

## 2015-08-03 DIAGNOSIS — G301 Alzheimer's disease with late onset: Secondary | ICD-10-CM

## 2015-08-03 DIAGNOSIS — F0280 Dementia in other diseases classified elsewhere without behavioral disturbance: Secondary | ICD-10-CM

## 2015-08-03 MED ORDER — DONEPEZIL HCL 10 MG PO TABS
10.0000 mg | ORAL_TABLET | Freq: Every day | ORAL | Status: DC
Start: 2015-08-03 — End: 2015-11-02

## 2015-08-03 MED ORDER — MEMANTINE HCL 28 X 5 MG & 21 X 10 MG PO TABS: ORAL_TABLET | ORAL | Status: DC

## 2015-08-03 NOTE — Progress Notes (Signed)
NEURO CLINIC   Provider:  Larey Seat, M D  Referring Provider: Thressa Sheller, MD Primary Care Physician:  Thressa Sheller, MD  Chief Complaint  Patient presents with  . Follow-up    discuss CT results, memory, rm 21, with husband and daughter    HPI:  Katherine [name] is a 70 y.o. female , seen here as a referral from Dr. Noah Delaine for a dementia evaluation,    Chief complaint according to patient : " I forget what I just said or what I was told " Mr. Lovullo reported that he noted over a year ago that his wife sometimes repeatedly asked the same questions or seems to have forgotten parts of the conversation that just to place. She also seemed to be more anxious, she did not longer like to drive high ways  but she also did not like to be driven long distance. The patient never got lost, but she gave up driving about 3 months ago by herself. She had noticed that when she usually likes to go shopping she had no sometimes shortness of breath or a feeling of chest tightness. She has a history of coronary artery disease and has a pacemaker. She has been treated with angioplasty. She has atrial fibrillation, pacemaker controlled. Renal artery stenosis. Katherine [name] gave up balancing the check book, about 14 month ago.  She is a retired Chemical engineer. She no longer reads a newspaper. Motor function changed, her handwriting is impaired, closing a belt is difficult. Katherine [name] also reports that she gets restful sleep and usually feels refreshed and restored in the morning, she does not report any nightmarish dreams. She has not acted out on dreams or had been known to have sundowning. Her husband has noted her to be more likely to be emotional when she watches TV or listens to music. She may tear up but she has never been sobbing crying loudly.   Interval history from 08/03/2015. I have the pleasure of seeing Katherine [name], her husband and her daughter today in the meantime our workup for memory  loss has progressed. The patient underwent a CT of the head without contrast showed moderate cortical atrophy but had significantly progressed when compared to a previous CT dated 2010 it was normal enhancement pattern and no evidence of a stroke but she does have widespread white matter disease related to chronic microvascular changes. Today's Mini-Mental Status Examination was repeated and she scored 14 points 1. More than last month. Her laboratory results have returned normal blood glucose, normal albumin normal bilirubin and normal alkaline phosphatase. Normal liver function, normal electrolytes. She has no biological information of family members.   She was reluctant to perform a MMSE here in office.  MMSE - Mini Mental State Exam 08/03/2015 06/29/2015  Orientation to time 1 1  Orientation to Place 3 3  Registration 3 3  Attention/ Calculation 0 0  Recall 0 0  Language- name 2 objects 2 2  Language- repeat 0 0  Language- follow 3 step command 3 3  Language- read & follow direction 1 1  Write a sentence 1 0  Copy design 0 0  Total score 14 13   Social history:  Married, one daughter, a Pharmacist, hospital in [place]- one son, Conservation officer, nature rep , living in [place].   Review of Systems: Out of a complete 14 system review, the patient complains of only the following symptoms, and all other reviewed systems are negative.  Social History   Social  History  . Marital Status: Married    Spouse Name: N/A  . Number of Children: N/A  . Years of Education: N/A   Occupational History  . Not on file.   Social History Main Topics  . Smoking status: Former Research scientist (life sciences)  . Smokeless tobacco: Not on file     Comment: Smoking for over 45 years  . Alcohol Use: No  . Drug Use: No  . Sexual Activity: Not on file   Other Topics Concern  . Not on file   Social History Narrative    Family History  Problem Relation Age of Onset  . Cancer Father   . Heart disease Father     Past Medical History    Diagnosis Date  . Coronary artery disease     a. s/p overlapping DES to RCA 04/2010 (days after neg stress test). b. Cath 06/2010 - patent stents, nonobst dz otherwise; c. 10/2012 Cath: LM nl, LAD 95m, D1/2/3 nl, LCX 20p, OM2 50p, RCA 20% ISR mid, EF 55-60% ->Med Rx.    [name] Hypertension   . HLD (hyperlipidemia)   . Left bundle-branch block   . Tobacco abuse   . Monoclonal gammopathy of undetermined significance     a. Previously seen by heme - this is per chart but pt does not recall.  [name] PVC's (premature ventricular contractions)   . Carotid artery disease ([place])     a. Hx r carotid bruit - dopplers 2009 <50% bilat;  b. 10/2012 U/S: no hemodynamically signif stenosis.  . Sick sinus syndrome ([place])     a. s/p PPM on 11/14/13 with Medtronic Adapta L model ADDRL 1 (serial number [id number]) pacemaker  . History of echocardiogram     a. 2D ECHO: 11/14/2013: EF 60-65%, mild concentric LVH, no RWMAs. G1DD. Mod LA dilation, mild RA dilation, PA pressure 31.  . Paroxysmal atrial fibrillation ([place]) 03/30/2015    observed on pacemaker interrogation, longest episode 2 minutes    Current Outpatient Prescriptions  Medication Sig Dispense Refill  . Ascorbic Acid (VITAMIN C) 500 MG tablet Take 250 mg by mouth daily.     [name] aspirin 81 MG tablet Take 81 mg by mouth daily.    [name] donepezil (ARICEPT) 5 MG tablet Take 1 tablet (5 mg total) by mouth at bedtime. 90 tablet 0  . ibuprofen (ADVIL,MOTRIN) 600 MG tablet Take 600 mg by mouth every 6 (six) hours as needed (pain).    [name] levothyroxine (SYNTHROID, LEVOTHROID) 25 MCG tablet TK 1 T PO QD  1  . losartan (COZAAR) 25 MG tablet TAKE 1 TABLET BY MOUTH EVERY DAY 90 tablet 1  . nitroGLYCERIN (NITROSTAT) 0.4 MG SL tablet Place 0.4 mg under the tongue every 5 (five) minutes as needed for chest pain (MAX 3 TABLETS).     [name] ZETIA 10 MG tablet TK 1 T PO ONCE D  1   No current facility-administered medications for this visit.     Vitals: BP 162/90 mmHg  Pulse 86  Resp  20  Ht 5\' 5"  (1.651 m)  Wt 139 lb (63.05 kg)  BMI 23.13 kg/m2 Last Weight:  Wt Readings from Last 1 Encounters:  08/03/15 139 lb (63.05 kg)   [id number] mass index is 23.13 kg/(m^2).     Last Height:   Ht Readings from Last 1 Encounters:  08/03/15 5\' 5"  (1.651 m)    Physical exam:  General: The patient is awake, alert and appears not in acute distress. The patient is well  groomed. Head: Normocephalic, atraumatic. Neck is supple.  Cardiovascular:  irregular rate and rhythm with cardiac ejection murmur,   carotid bruit, and without distended neck veins. Respiratory: Lungs are clear to auscultation. Skin:  Without evidence of edema, or rash Trunk: BMI is normal.The patient's posture is erect   Neurologic exam : The patient is awake and alert, oriented to place and time.   Memory subjective described as impaired - severely -  Memory testing revealed severe impairment.  Attention span & concentration ability appears impaired, small talk only.  Speech is fluent,  Without dysarthria, dysphonia or aphasia.  Mood and affect are appropriate.  Cranial nerves: Pupils are equal and briskly reactive to light. Funduscopic exam without palor. She  has difficulties fallowing verbal instructions but aims to please.  NO evidence of pallor or edema. Extraocular movements  in vertical and horizontal planes intact and without nystagmus. Visual fields by finger perimetry are intact. Hearing to finger rub intact. Facial sensation intact to fine touch.Facial motor strength is symmetric and tongue and uvula move midline. Shoulder shrug was symmetrical.   Motor exam:  Elevated  tone, normal muscle bulk and symmetric strength in all extremities. Brisk reflexes and gegenhalten, inability to relax.  Sensory:  Fine touch, pinprick and vibration were normal  Proprioception tested in the upper extremities was normal. Coordination: Rapid alternating movements in the fingers/hands was normal. Finger-to-nose maneuver  with  evidence of ataxia, dysmetria , not  tremor. Gait and station: Patient walks without assistive device and is able unassisted to climb up to the exam table. Strength within normal limits.Stance is stable and normal.  Deep tendon reflexes: in the  upper and lower extremities are very brisk, but symmetric . Babinski maneuver response is downgoing.  The patient was advised of the nature of the diagnosed memory disorder ,  the treatment options and risks for general a health and wellness arising from not treating the condition.  I spent more than 30  minutes of face to face time with the patient. Greater than 50% of time was spent in counseling and coordination of care. We have discussed the diagnosis and differential and I answered the patient's questions.     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   2) I was surprised about the score on her Mini-Mental Status Examination since during the interview I had not the feeling that Katherine [name] is at significantly disoriented or unable to you to conversation.  She denied any trauma, falls, focal weakness, focal visual changes or numbness. she has definitely had remarkable changes in her handwriting,  is sometimes more clumsy and has difficulties to comprehend verbal instructions.   No metabolic abnormality was noted. Dr. Aline August started her recently on Zetia for hyperlipidemia.  Plan:  Treatment plan and additional workup :  Add Namenda to Aricept. Rv in 3 month,  No sun downing   PT gait and balance evaluation. OT ordered.  RV in 3 month with NP    Larey Seat  MD  08/03/2015   CC:    Thressa Sheller, Md [address], [place], [place] [address]

## 2015-08-03 NOTE — Patient Instructions (Addendum)
Memantine Tablets What is this medicine? MEMANTINE (MEM an teen) is used to treat dementia caused by Alzheimer's disease. This medicine may be used for other purposes; ask your health care provider or pharmacist if you have questions. What should I tell my health care provider before I take this medicine? They need to know if you have any of these conditions: -difficulty passing urine -kidney disease -liver disease -seizures -an unusual or allergic reaction to memantine, other medicines, foods, dyes, or preservatives -pregnant or trying to get pregnant -breast-feeding How should I use this medicine? Take this medicine by mouth with a glass of water. Follow the directions on the prescription label. You may take this medicine with or without food. Take your doses at regular intervals. Do not take your medicine more often than directed. Continue to take your medicine even if you feel better. Do not stop taking except on the advice of your doctor or health care professional. Talk to your pediatrician regarding the use of this medicine in children. Special care may be needed. Overdosage: If you think you have taken too much of this medicine contact a poison control center or emergency room at once. NOTE: This medicine is only for you. Do not share this medicine with others. What if I miss a dose? If you miss a dose, take it as soon as you can. If it is almost time for your next dose, take only that dose. Do not take double or extra doses. If you do not take your medicine for several days, contact your health care provider. Your dose may need to be changed. What may interact with this medicine? -acetazolamide -amantadine -cimetidine -dextromethorphan -dofetilide -hydrochlorothiazide -ketamine -metformin -methazolamide -quinidine -ranitidine -sodium bicarbonate -triamterene This list may not describe all possible interactions. Give your health care provider a list of all the medicines,  herbs, non-prescription drugs, or dietary supplements you use. Also tell them if you smoke, drink alcohol, or use illegal drugs. Some items may interact with your medicine. What should I watch for while using this medicine? Visit your doctor or health care professional for regular checks on your progress. Check with your doctor or health care professional if there is no improvement in your symptoms or if they get worse. You may get drowsy or dizzy. Do not drive, use machinery, or do anything that needs mental alertness until you know how this drug affects you. Do not stand or sit up quickly, especially if you are an older patient. This reduces the risk of dizzy or fainting spells. Alcohol can make you more drowsy and dizzy. Avoid alcoholic drinks. What side effects may I notice from receiving this medicine? Side effects that you should report to your doctor or health care professional as soon as possible: -allergic reactions like skin rash, itching or hives, swelling of the face, lips, or tongue -agitation or a feeling of restlessness -depressed mood -dizziness -hallucinations -redness, blistering, peeling or loosening of the skin, including inside the mouth -seizures -vomiting Side effects that usually do not require medical attention (report to your doctor or health care professional if they continue or are bothersome): -constipation -diarrhea -headache -nausea -trouble sleeping This list may not describe all possible side effects. Call your doctor for medical advice about side effects. You may report side effects to FDA at 1-800-FDA-1088. Where should I keep my medicine? Keep out of the reach of children. Store at room temperature between 15 degrees and 30 degrees C (59 degrees and 86 degrees F). Throw away any  unused medicine after the expiration date. NOTE: This sheet is a summary. It may not cover all possible information. If you have questions about this medicine, talk to your doctor,  pharmacist, or health care provider.    2016, Elsevier/Gold Standard. (2013-01-21 14:10:42) Alzheimer Disease Caregiver Guide Alzheimer disease is an illness that affects a person's brain. It causes a person to lose the ability to remember things and make good decisions. As the disease progresses, the person is unable to take care of himself or herself and needs more and more help to do simple tasks. Taking care of someone with Alzheimer disease can be very challenging and overwhelming.  MEMORY LOSS AND CONFUSION Memory loss and confusion is mild in the beginning stages of the disease. Both of these problems become more severe as the disease progresses. Eventually, the person will not recognize places or even close family members and friends.   Stay calm.  Respond with a short explanation. Long explanations can be overwhelming and confusing.  Avoid corrections that sound like scolding.  Try not to take it personally, even if the person forgets your name. BEHAVIOR CHANGES Behavior changes are part of the disease. The person may develop depression, anxiety, anger, hallucinations, or other behavior changes. These changes can come on suddenly and may be in response to pain, infection, changes in the environment (temperature, noise), overstimulation, or feeling lost or scared.   Try not to take behavior changes personally.  Remain calm and patient.  Do not argue or try to convince the person about a specific point. This will only make him or her more agitated.  Know that the behavior changes are part of the disease process and try to work through it. TIPS TO REDUCE FRUSTRATION  Schedule wisely by making appointments and doing daily tasks, like bathing and dressing, when the person is at his or her best.  Take your time. Simple tasks may take a lot longer, so be sure to allow for plenty of time.  Limit choices. Too many choices can be overwhelming and stressful for the person.  Involve  the person in what you are doing.  Stick to a routine.  Avoid new or crowded situations, if possible.  Use simple words, short sentences, and a calm voice. Only give one direction at a time.  Buy clothes and shoes that are easy to put on and take off.  Let people help if they offer. HOME SAFETY Keeping the home safe is very important to reduce the risk of falls and injuries.   Keep floors clear of clutter. Remove rugs, magazine racks, and floor lamps.  Keep hallways well lit.  Put a handrail and nonslip mat in the bathtub or shower.  Put childproof locks on cabinets with dangerous items, such as medicine, alcohol, guns, toxic cleaning items, sharp tools or utensils, matches, or lighters.  Place locks on doors where the person cannot easily see or reach them. This helps ensure that the person cannot wander out of the house and get lost.  Be prepared for emergencies. Keep a list of emergency phone numbers and addresses in a convenient area. PLANS FOR THE FUTURE  Do not put off talking about finances.  Talk about money management. People with Alzheimer disease have trouble managing their money as the disease gets worse.  Get help from professional advisors regarding financial and legal matters.  Do not put off talking about future care.  Choose a power of attorney. This is someone who can make decisions for  the person with Alzheimer disease when he or she is no longer able to do so.  Talk about driving and when it is the right time to stop. The person's health care provider can help give advice on this matter.  Talk about the person's living situation. If he or she lives alone, you need to make sure he or she is safe. Some people need extra help at home, and others need more care at a nursing home or care center. SUPPORT GROUPS Joining a support group can be very helpful for caregivers of people with Alzheimer disease. Some advantages to being part of a support group include:    Getting strategies to manage stress.  Sharing experiences with others.  Receiving emotional comfort and support.  Learning new caregiving skills as the disease progresses.  Knowing what community resources are available and taking advantage of them. SEEK MEDICAL CARE IF:  The person has a fever.  The person has a sudden change in behavior that does not improve with calming strategies.  The person is unable to manage in his or her current living situation.  The person threatens you or anyone else, including himself or herself.  You are no longer able to care for the person.   This information is not intended to replace advice given to you by your health care provider. Make sure you discuss any questions you have with your health care provider.   Document Released: 12/15/2003 Document Revised: 04/25/2014 Document Reviewed: 05/11/2011 Elsevier Interactive Patient Education [employer].

## 2015-10-16 ENCOUNTER — Other Ambulatory Visit: Payer: Self-pay

## 2015-10-16 MED ORDER — LOSARTAN POTASSIUM 25 MG PO TABS: 25.0000 mg | ORAL_TABLET | Freq: Every day | ORAL | Status: DC

## 2015-10-27 IMAGING — CR DG CHEST 2V
2 series · 2 of 2 positions shown · non-contrast
Comparison: November 13, 2013.

CLINICAL DATA: Status post pacemaker placement.

EXAM:
CHEST  2 VIEW

[w chest pa]
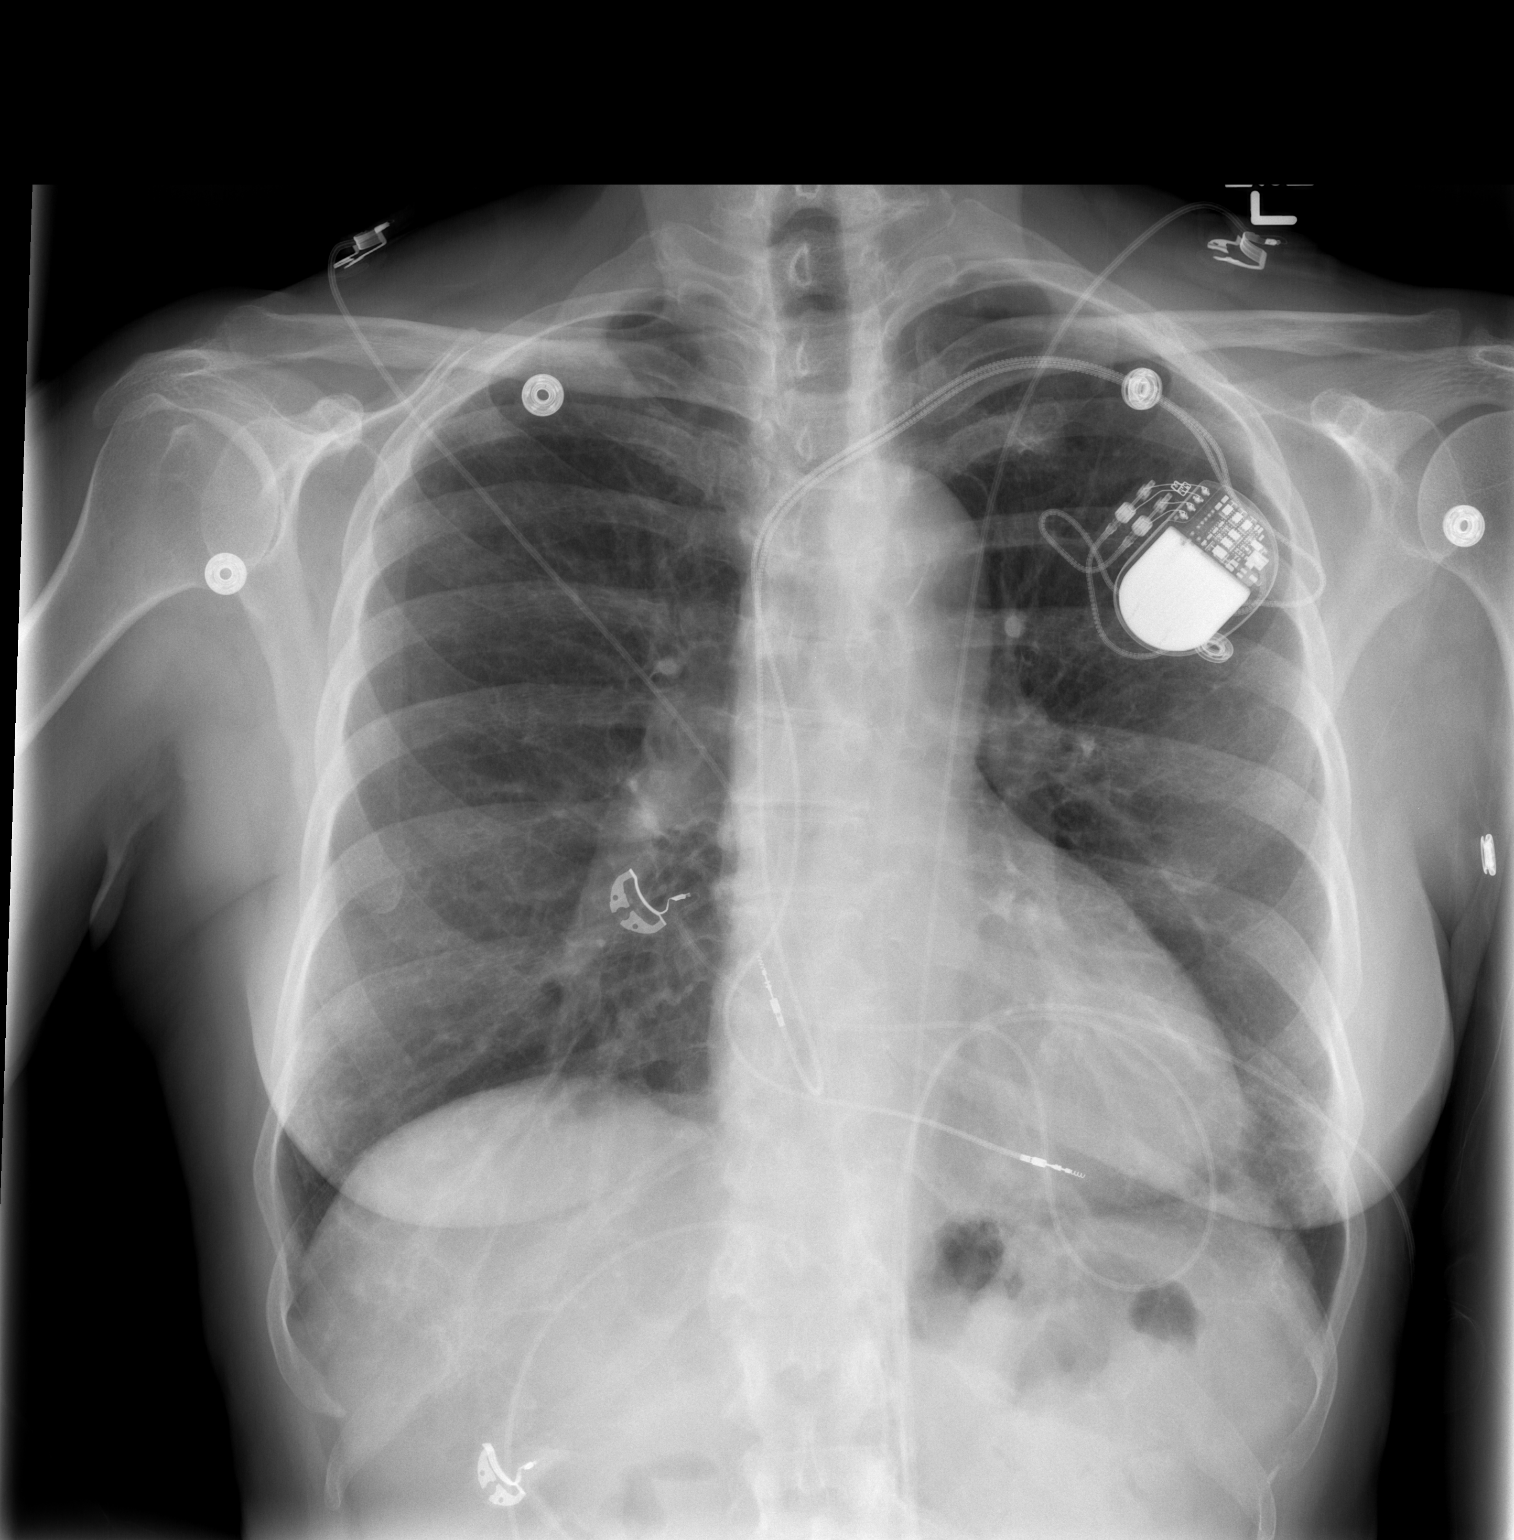

[w chest lat]
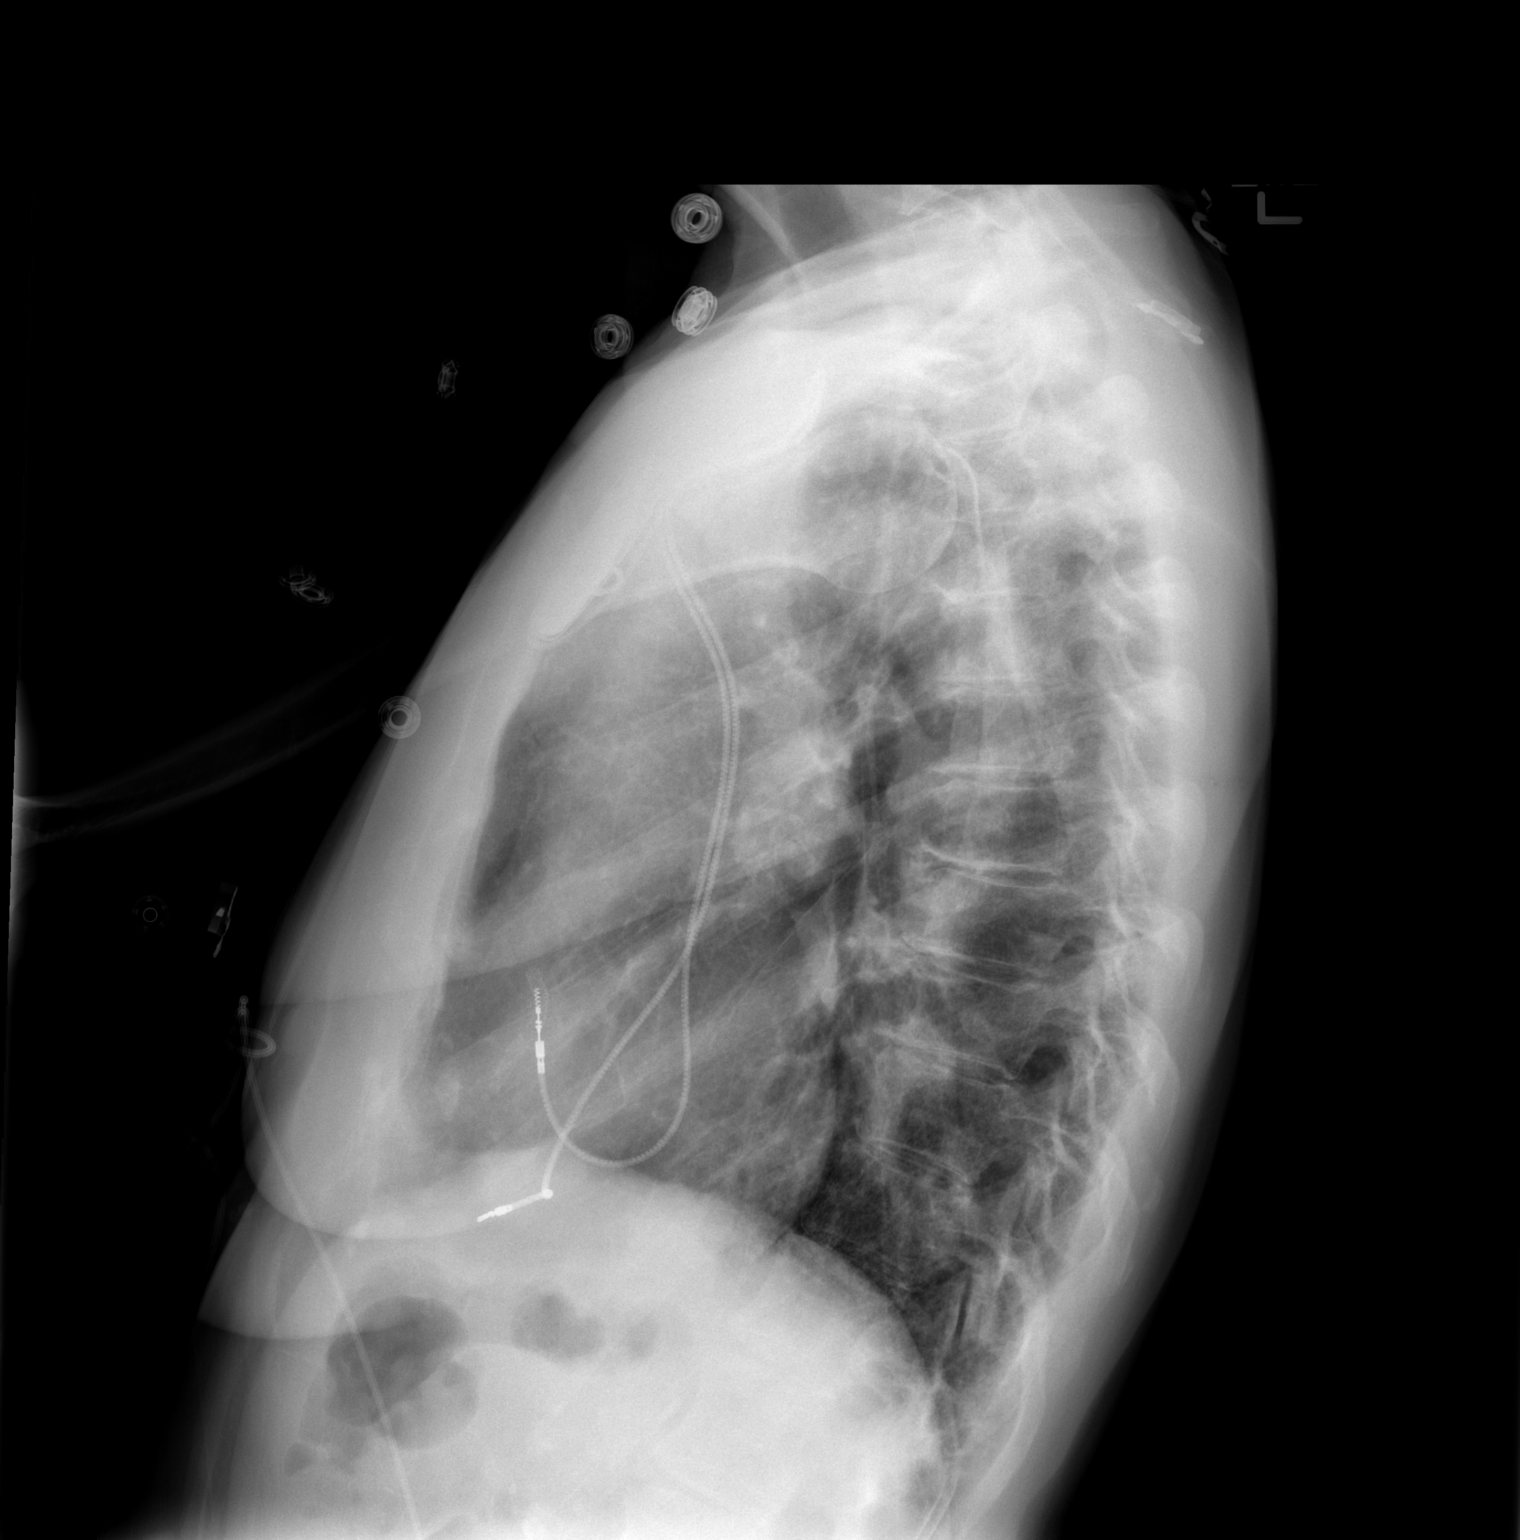

[2 of 2 positions shown; findings below may reference images not displayed]

FINDINGS: The heart size and mediastinal contours are within normal limits.
Both lungs are clear. Interval placement of left-sided pacemaker
with leads in grossly good position. No pneumothorax or pleural
effusion is noted. The visualized skeletal structures are
unremarkable.
IMPRESSION: Interval placement of left-sided pacemaker in grossly good position.
No pneumothorax is noted.

## 2015-11-02 ENCOUNTER — Encounter: Payer: Self-pay | Admitting: Adult Health

## 2015-11-02 ENCOUNTER — Ambulatory Visit (INDEPENDENT_AMBULATORY_CARE_PROVIDER_SITE_OTHER): Payer: Medicare Other | Admitting: Adult Health

## 2015-11-02 ENCOUNTER — Telehealth: Payer: Self-pay | Admitting: Adult Health

## 2015-11-02 VITALS — BP 134/86 | HR 72 | Ht 65.0 in | Wt 137.4 lb

## 2015-11-02 DIAGNOSIS — F0280 Dementia in other diseases classified elsewhere without behavioral disturbance: Secondary | ICD-10-CM | POA: Diagnosis not present

## 2015-11-02 DIAGNOSIS — G301 Alzheimer's disease with late onset: Secondary | ICD-10-CM

## 2015-11-02 MED ORDER — DONEPEZIL HCL 10 MG PO TABS: 10.0000 mg | ORAL_TABLET | Freq: Every day | ORAL | Status: DC

## 2015-11-02 MED ORDER — MEMANTINE HCL 10 MG PO TABS: 10.0000 mg | ORAL_TABLET | Freq: Two times a day (BID) | ORAL | Status: DC

## 2015-11-02 NOTE — Patient Instructions (Signed)
Restart Aricept 5 mg for the first week then increase to 10 mg Continue namenda Memory score is stable If your symptoms worsen or you develop new symptoms please let us know.

## 2015-11-02 NOTE — Telephone Encounter (Signed)
Pt's husband called sts he forgot to ask if PT would help the pt.

## 2015-11-02 NOTE — Progress Notes (Signed)
PATIENT: Katherine [name] DOB: 07/30/1945  REASON FOR VISIT: follow up HISTORY FROM: patient  HISTORY OF PRESENT ILLNESS: Mr. Schuttler is a 70 year old female with a history of Alzheimer's disease. She returns today for follow-up. At the last visit she was started on Namenda however the patient's husband was unaware that she should stay on Aricept. He states that she has been tolerating Namenda however each months she has been getting the titration pack to she is been continuously titrating the medication instead of staying on a maintenance dose. The patient lives at home with her husband. She is able to complete all ADLs independently. She does not operate a motor vehicle. She does take walks by herself without difficulty. They no longer cooks but rather go out. The husband now controls all the finances. He does report that she has some trouble doing her hair but other than that she is able to do most on her own. Denies any trouble with behavior or agitation. Sleeps well. Good appetite. Returns today for an evaluation.  HISTORY 08/03/2015 ([place]): Katherine [name] is a 70 y.o. female , seen here as a referral from Dr. Noah Delaine for a dementia evaluation,    Chief complaint according to patient : " I forget what I just said or what I was told " Mr. Portes reported that he noted over a year ago that his wife sometimes repeatedly asked the same questions or seems to have forgotten parts of the conversation that just to place. She also seemed to be more anxious, she did not longer like to drive high ways but she also did not like to be driven long distance. The patient never got lost, but she gave up driving about 3 months ago by herself. She had noticed that when she usually likes to go shopping she had no sometimes shortness of breath or a feeling of chest tightness. She has a history of coronary artery disease and has a pacemaker. She has been treated with angioplasty. She has atrial fibrillation, pacemaker  controlled. Renal artery stenosis. [name] gave up balancing the check book, about 14 month ago. She is a retired Chemical engineer. She no longer reads a newspaper. Motor function changed, her handwriting is impaired, closing a belt is difficult. [name] also reports that she gets restful sleep and usually feels refreshed and restored in the morning, she does not report any nightmarish dreams. She has not acted out on dreams or had been known to have sundowning. Her husband has noted her to be more likely to be emotional when she watches TV or listens to music. She may tear up but she has never been sobbing crying loudly.   Interval history from 08/03/2015. I have the pleasure of seeing [name], her husband and her daughter today in the meantime our workup for memory loss has progressed. The patient underwent a CT of the head without contrast showed moderate cortical atrophy but had significantly progressed when compared to a previous CT dated 2010 it was normal enhancement pattern and no evidence of a stroke but she does have widespread white matter disease related to chronic microvascular changes. Today's Mini-Mental Status Examination was repeated and she scored 14 points 1. More than last month. Her laboratory results have returned normal blood glucose, normal albumin normal bilirubin and normal alkaline phosphatase. Normal liver function, normal electrolytes. She has no biological information of family members.   REVIEW OF SYSTEMS: Out of a complete 14 system review of  symptoms, the patient complains only of the following symptoms, and all other reviewed systems are negative.  cold Intolerance, memory loss  ALLERGIES: Allergies  Allergen Reactions  . Isosorbide Nitrate Swelling    SWELLING OF THE TONGUE - pt states she received this after PNA ? But has been able to tolerate SL nitroglycerin without adverse reaction.  . Metoprolol Succinate [Metoprolol] Other (See Comments)     Dizziness, diarrhea, tingling in fingers  . Zocor [Simvastatin] Other (See Comments)    Leg pain - does not tolerate high doses of statins  . Ezetimibe Other (See Comments)    unknown  . Plavix [Clopidogrel Bisulfate] Other (See Comments)    Prior P2Y12 showing 267 (poor Plavix responsiveness)  . Sulfa Antibiotics Other (See Comments)    Sulfa drugs as a child. UNKNOWN REACTION  . Sulfonamide Derivatives Other (See Comments)    Sulfa drugs as a child. UNKNOWN REACTION    HOME MEDICATIONS: Outpatient Prescriptions Prior to Visit  Medication Sig Dispense Refill  . Ascorbic Acid (VITAMIN C) 500 MG tablet Take 250 mg by mouth daily.     [name] ibuprofen (ADVIL,MOTRIN) 600 MG tablet Take 600 mg by mouth every 6 (six) hours as needed (pain).    [name] levothyroxine (SYNTHROID, LEVOTHROID) 25 MCG tablet TK 1 T PO QD  1  . losartan (COZAAR) 25 MG tablet Take 1 tablet (25 mg total) by mouth daily. 90 tablet 1  . nitroGLYCERIN (NITROSTAT) 0.4 MG SL tablet Place 0.4 mg under the tongue every 5 (five) minutes as needed for chest pain (MAX 3 TABLETS).     [name] ZETIA 10 MG tablet TK 1 T PO ONCE D  1  . memantine (NAMENDA TITRATION PAK) tablet pack 5 mg/day for =1 week; 5 mg twice daily for =1 week; 15 mg/day given in 5 mg and 10 mg separated doses for =1 week; then 10 mg twice daily 49 tablet 12  . aspirin 81 MG tablet Take 81 mg by mouth daily.    [name] donepezil (ARICEPT) 10 MG tablet Take 1 tablet (10 mg total) by mouth at bedtime. (Patient not taking: Reported on 11/02/2015) 90 tablet 2   No facility-administered medications prior to visit.    PAST MEDICAL HISTORY: Past Medical History  Diagnosis Date  . Coronary artery disease     a. s/p overlapping DES to RCA 04/2010 (days after neg stress test). b. Cath 06/2010 - patent stents, nonobst dz otherwise; c. 10/2012 Cath: LM nl, LAD 52m, D1/2/3 nl, LCX 20p, OM2 50p, RCA 20% ISR mid, EF 55-60% ->Med Rx.    [name] Hypertension   . HLD (hyperlipidemia)   . Left  bundle-branch block   . Tobacco abuse   . Monoclonal gammopathy of undetermined significance     a. Previously seen by heme - this is per chart but pt does not recall.  [name] PVC's (premature ventricular contractions)   . Carotid artery disease ([place])     a. Hx r carotid bruit - dopplers 2009 <50% bilat;  b. 10/2012 U/S: no hemodynamically signif stenosis.  . Sick sinus syndrome ([place])     a. s/p PPM on 11/14/13 with Medtronic Adapta L model ADDRL 1 (serial number [id number]) pacemaker  . History of echocardiogram     a. 2D ECHO: 11/14/2013: EF 60-65%, mild concentric LVH, no RWMAs. G1DD. Mod LA dilation, mild RA dilation, PA pressure 31.  . Paroxysmal atrial fibrillation ([place]) 03/30/2015    observed on pacemaker interrogation, longest episode 2  minutes    PAST SURGICAL HISTORY: Past Surgical History  Procedure Laterality Date  . Pci      HIGH GRADE LESION IN THE MIDPORTION OF THE RIGHT CORONARY ARTERY WHICH WAS A LARGE ARTERY  . Cardiac catheterization  05/14/10    SHOWED EVIDENCE OF REVERSIBLE VASOSPASM  . Pacemaker insertion  11/14/13    MDT Adapta L implanted by Dr Rayann Heman for Sick sinus syndrome  . Left heart catheterization with coronary angiogram N/A 10/17/2012    Procedure: LEFT HEART CATHETERIZATION WITH CORONARY ANGIOGRAM;  Surgeon: Wellington Hampshire, MD;  Location: [facility] CATH [facility];  Service: Cardiovascular;  Laterality: N/A;  . Permanent pacemaker insertion N/A 11/14/2013    Procedure: PERMANENT PACEMAKER INSERTION;  Surgeon: Coralyn Mark, MD;  Location: [facility] CATH [facility];  Service: Cardiovascular;  Laterality: N/A;    FAMILY HISTORY: Family History  Problem Relation Age of Onset  . Cancer Father   . Heart disease Father     SOCIAL HISTORY: Social History   Social History  . Marital Status: Married    Spouse Name: N/A  . Number of Children: N/A  . Years of Education: N/A   Occupational History  . Not on file.   Social History Main Topics  . Smoking status: Former Research scientist (life sciences)  .  Smokeless tobacco: Not on file     Comment: Smoking for over 45 years  . Alcohol Use: No  . Drug Use: No  . Sexual Activity: Not on file   Other Topics Concern  . Not on file   Social History Narrative   Lives at home w/ her husband   Right-handed      PHYSICAL EXAM  Filed Vitals:   11/02/15 1414  BP: 134/86  Pulse: 72  Height: 5\' 5"  (1.651 m)  Weight: 137 lb 6.4 oz (62.324 kg)   Body mass index is 22.86 kg/(m^2).  MMSE - Mini Mental State Exam 11/02/2015 08/03/2015 06/29/2015  Orientation to time 0 1 1  Orientation to Place 3 3 3   Registration 3 3 3   Attention/ Calculation 1 0 0  Recall 0 0 0  Language- name 2 objects 2 2 2   Language- repeat 1 0 0  Language- follow 3 step command 2 3 3   Language- read & follow direction 1 1 1   Write a sentence 0 1 0  Copy design 0 0 0  Total score 13 14 13      Generalized: Well developed, in no acute distress   Neurological examination  Mentation: Alert. Difficulty following commands. Often needs demonstration. speech and language fluent Cranial nerve II-XII: Pupils were equal round reactive to light. Extraocular movements were full, visual field were full on confrontational test. Facial sensation and strength were normal. Uvula tongue midline. Head turning and shoulder shrug  were normal and symmetric. Motor: The motor testing reveals 5 over 5 strength of all 4 extremities. Good symmetric motor tone is noted throughout.  Sensory: Sensory testing is intact to soft touch on all 4 extremities. No evidence of extinction is noted.  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  Gait and station: Gait is normal. Tandem gait is normal. Romberg is negative. No drift is seen.  Reflexes: Deep tendon reflexes are symmetric and normal bilaterally.   DIAGNOSTIC DATA (LABS, IMAGING, TESTING) - I reviewed patient records, labs, notes, testing and imaging myself where available.        Component Value Date/Time   NA 142 06/29/2015 1507   NA 136 04/01/2014 1541  K 5.0 06/29/2015 1507   CL 103 06/29/2015 1507   CO2 22 06/29/2015 1507   GLUCOSE 93 06/29/2015 1507   GLUCOSE 105* 04/01/2014 1541   BUN 15 06/29/2015 1507   BUN 18 04/01/2014 1541   CREATININE 0.81 06/29/2015 1507   CALCIUM 9.9 06/29/2015 1507   PROT 7.7 06/29/2015 1507   PROT 7.1 03/05/2014 1521   ALBUMIN 4.5 06/29/2015 1507   ALBUMIN 3.4* 03/05/2014 1521   AST 21 06/29/2015 1507   ALT 7 06/29/2015 1507   ALKPHOS 75 06/29/2015 1507   BILITOT 0.4 06/29/2015 1507   BILITOT 0.2* 03/05/2014 1521   GFRNONAA 75 06/29/2015 1507   GFRAA 86 06/29/2015 1507        ASSESSMENT AND PLAN 70 y.o. year old female  has a past medical history of Coronary artery disease; Hypertension; HLD (hyperlipidemia); Left bundle-branch block; Tobacco abuse; Monoclonal gammopathy of undetermined significance; PVC's (premature ventricular contractions); Carotid artery disease ([place]); Sick sinus syndrome ([place]); History of echocardiogram; and Paroxysmal atrial fibrillation ([place]) (03/30/2015). here with:  1. Alzheimer's disease  Overall the patient has remained stable. She will continue on Namenda. I have ordered the maintenance dose for the patient. She will  restart Aricept. Begin taking half a tablet at bedtime for 1 week then increasing to fall 10 mg. Patient advised that if her symptoms worsen or she develops any new symptoms that she should let us know. We'll follow-up in 6 months or sooner if needed.    Ward Givens, MSN, NP-C 11/02/2015, 2:43 PM [facility] [address], [place], [place] [address] [phone]

## 2015-11-02 NOTE — Progress Notes (Signed)
I agree with the assessment and plan as directed by NP .The patient is known to me .   Briah Nary, MD  

## 2015-11-03 NOTE — Telephone Encounter (Signed)
Not sure PT is needed at this time.

## 2015-11-18 ENCOUNTER — Telehealth: Payer: Self-pay | Admitting: Neurology

## 2015-11-18 DIAGNOSIS — Z5189 Encounter for other specified aftercare: Secondary | ICD-10-CM

## 2015-11-18 NOTE — Telephone Encounter (Signed)
Called and left message for patient's husband. Relayed referral has been sent for occupational health next door at [facility], relayed there telephone number [phone]. Relayed if any questions please give the office a call back. Thanks Hinton Dyer.

## 2015-11-18 NOTE — Telephone Encounter (Signed)
Patient husband came back and said that she does not want her to be referred as Alzheimer he said they just lost a friend from [place] and she got very upset about it and so her husband does not want her to be addressed with Alzheimer's.Katherine [name]

## 2015-11-18 NOTE — Telephone Encounter (Signed)
Pt's husband stopped by the office and asked for a referral to occupational therapy and asked if [facility] could do it next door. I advised him that we could place the referral and find the closest occupation rehab place for the pt. I spoke to Dr. Brett Fairy and she agreed that this pt could benefit with occupational therapy.  Hinton Dyer and Hilda Blades, can you make sure that the occupational health referral goes to either [facility] next [facility] or coordinate with the pt and husband where they would like it to go?

## 2015-12-28 ENCOUNTER — Encounter: Payer: Self-pay | Admitting: *Deleted

## 2016-01-04 ENCOUNTER — Ambulatory Visit: Payer: Medicare Other | Attending: Neurology | Admitting: Occupational Therapy

## 2016-01-04 DIAGNOSIS — R482 Apraxia: Secondary | ICD-10-CM | POA: Diagnosis not present

## 2016-01-04 DIAGNOSIS — R278 Other lack of coordination: Secondary | ICD-10-CM | POA: Insufficient documentation

## 2016-01-04 DIAGNOSIS — R41840 Attention and concentration deficit: Secondary | ICD-10-CM | POA: Insufficient documentation

## 2016-01-04 DIAGNOSIS — R41841 Cognitive communication deficit: Secondary | ICD-10-CM

## 2016-01-05 NOTE — Therapy (Addendum)
[facility] [address] [place], [place], [address] Phone: [phone]   Fax:  [phone]  Occupational Therapy Evaluation  Patient Details  Name: Katherine [name] MRN: [id number] Date of Birth: December 15, 1945 Referring Provider: Dr. Asencion Partridge Dohmeier  Encounter Date: 01/04/2016      OT End of Session - 01/05/16 2022    Visit Number 1   Number of Visits 13   Date for OT Re-Evaluation 03/05/16   Authorization Type [employer] Medicare, G-code needed, no visit limit/auth   Authorization - Visit Number 1   Authorization - Number of Visits 10   OT Start Time 1400   OT Stop Time 1445   OT Time Calculation (min) 45 min   Activity Tolerance Patient tolerated treatment well   Behavior During Therapy Agitated  at times, lability, tearful      Past Medical History:  Diagnosis Date  . Carotid artery disease ([place])    a. Hx r carotid bruit - dopplers 2009 <50% bilat;  b. 10/2012 U/S: no hemodynamically signif stenosis.  . Coronary artery disease    a. s/p overlapping DES to RCA 04/2010 (days after neg stress test). b. Cath 06/2010 - patent stents, nonobst dz otherwise; c. 10/2012 Cath: LM nl, LAD 57m, D1/2/3 nl, LCX 20p, OM2 50p, RCA 20% ISR mid, EF 55-60% ->Med Rx.    [name] History of echocardiogram    a. 2D ECHO: 11/14/2013: EF 60-65%, mild concentric LVH, no RWMAs. G1DD. Mod LA dilation, mild RA dilation, PA pressure 31.  [name] HLD (hyperlipidemia)   . Hypertension   . Left bundle-branch block   . Monoclonal gammopathy of undetermined significance    a. Previously seen by heme - this is per chart but pt does not recall.  . Paroxysmal atrial fibrillation ([place]) 03/30/2015   observed on pacemaker interrogation, longest episode 2 minutes  . PVC's (premature ventricular contractions)   . Sick sinus syndrome ([place])    a. s/p PPM on 11/14/13 with Medtronic Adapta L model ADDRL 1 (serial number [id number]) pacemaker  . Tobacco abuse     Past Surgical  History:  Procedure Laterality Date  . CARDIAC CATHETERIZATION  05/14/10   SHOWED EVIDENCE OF REVERSIBLE VASOSPASM  . LEFT HEART CATHETERIZATION WITH CORONARY ANGIOGRAM N/A 10/17/2012   Procedure: LEFT HEART CATHETERIZATION WITH CORONARY ANGIOGRAM;  Surgeon: Wellington Hampshire, MD;  Location: [facility] CATH [facility];  Service: Cardiovascular;  Laterality: N/A;  . PACEMAKER INSERTION  11/14/13   MDT Adapta L implanted by Dr Rayann Heman for Sick sinus syndrome  . PCI     HIGH GRADE LESION IN THE MIDPORTION OF THE RIGHT CORONARY ARTERY WHICH WAS A LARGE ARTERY  . PERMANENT PACEMAKER INSERTION N/A 11/14/2013   Procedure: PERMANENT PACEMAKER INSERTION;  Surgeon: Coralyn Mark, MD;  Location: [facility] CATH [facility];  Service: Cardiovascular;  Laterality: N/A;    There were no vitals filed for this visit.      Subjective Assessment - 01/04/16 1403    Subjective  "It's mainly just my writing"   Pertinent History Alzheimer's disease, pacemaker, cardiac stents   Limitations Pacemaker, **per epic notes husband does not want Alheimer's diagnosed discussed in front of pt**   Patient Stated Goals improve writing, being able to don belt   Currently in Pain? No/denies           [facility] OT Assessment - 01/05/16 0001      Assessment   Diagnosis Alzheimer's disease    Referring Provider Dr. Asencion Partridge  Dohmeier   Onset Date --  approx a year ago   Prior Therapy none     Precautions   Precautions Other (comment)   Precaution Comments per Epic notes, husband does not want Alzheimer's diagnosed discussed with pt; dementia     Balance Screen   Has the patient fallen in the past 6 months Yes   How many times? 1  in yard     [place] expects to be discharged to: Private residence   Lives With Spouse     Prior Function   Level of Independence Independent with basic ADLs     ADL   Eating/Feeding Independent  husband reports that pt switched to L hand at least once   Grooming --  unable to use  curling iron    Upper Body Bathing Independent   Lower Body Bathing Independent   Upper Body Dressing --  mod I, occasional assist for buttons   Lower Body Dressing Independent  needs A putting belts in loops   Toilet Tranfer Independent   Toileting - Clothing Manipulation Independent   Tub/Shower Transfer Independent   ADL comments Pt unable to copy words, but able to read/spell simple words.  Pt could not copy phrases/words, but was able to write simple words with mod cues and approx 50-75% legibility.  Difficulty with fastening seatbelt inconsistently.  Husband puts earrings in.     IADL   Shopping Needs to be accompanied on any shopping trip   Prior Level of Function Light Housekeeping independent   Light Housekeeping --  pt performs light tasks with husband   Prior Level of Function Meal Prep independent   Meal Prep --  uses microwave, cold meal prep   Community Mobility --  pt does not drive anymore   Medication Management --  husband supervises   Prior Level of Function Financial Management independent   Financial Management Dependent  husband performs     Mobility   Mobility Status Independent     Written Expression   Dominant Hand Right   Handwriting 50% legible  for name, incr difficulty for words, phrases     Vision - History   Baseline Vision Wears glasses only for reading     Cognition   Overall Cognitive Status Impaired/Different from baseline  diagnosed with Alzheimer's disease   Mini Mental State Exam  --  see neurologist notes for score   Area of Impairment Attention;Memory;Following commands;Awareness;Problem solving;Orientation   Orientation Level Time;Situation   Current Attention Level Selective  for eval   Memory Decreased short-term memory   Following Commands Follows one step commands inconsistently   Awareness Intellectual  decr for cognitive deficits   Problem Solving Requires verbal cues;Requires tactile cues;Slow processing   Memory  Impaired   Memory Impairment Decreased recall of new information;Decreased short term memory   Awareness Impaired   Problem Solving Impaired   Executive Function Reasoning;Sequencing;Organizing   Behaviors Verbal agitation;Poor frustration tolerance;Lability  at times     Sensation   Proprioception Impaired by gross assessment  particularly for RUE   Additional Comments pt denies numbness     Coordination   9 Hole Peg Test Right;Left   Right 9 Hole Peg Test 51.47  incr time/cueing for directions   Left 9 Hole Peg Test 41.37   Coordination --     Perception   Perception --     Praxis   Praxis Impaired   Praxis Impairment Details Ideomotor  and constructional  ROM / Strength   AROM / PROM / Strength Strength     AROM   Overall AROM  Within functional limits for tasks performed  grossly   Overall AROM Comments mod-max difficulty following directions (mod-max verbal, tactile, and demo cues).       Strength   Overall Strength Unable to assess;Due to impaired cognition     Hand Function   Right Hand Grip (lbs) 20   Left Hand Grip (lbs) 22                         OT Education - 01/05/16 1950    Education provided Yes   Education Details OT eval results/POC   Person(s) Educated Patient;Spouse   Methods Explanation;Demonstration   Comprehension Verbalized understanding          OT Short Term Goals - 01/05/16 Jul 20, 2117      OT SHORT TERM GOAL #1   Title Pt will perform HEP with min-mod cueing.--check STGs 02/17/16   Period --   Status New     OT SHORT TERM GOAL #2   Title Pt will be able to write name with 100% legibility in 3/5 trials.   Status New           OT Long Term Goals - 01/05/16 July 21, 2111      OT LONG TERM GOAL #1   Title Pt will be able to write simple phrase with 75% legibility.--check LTGs 03/05/16   Status New     OT LONG TERM GOAL #2   Title Pt will be able to complete buttons at least 90% of the time without assist.    Status New     OT LONG TERM GOAL #3   Title Pt will be able to don/fasten belt mod I at least 90% of the time.   Status New     OT LONG TERM GOAL #4   Title --     OT LONG TERM GOAL #5   Title --               Plan - 01/05/16 2025    Clinical Impression Statement Pt is a 70 y.o. female with diagnosis of Alzheimer's disease.  Pt with PMH that includes cardiac stents and pacemaker.  Pt presents with cognitive deficits, decr RUE functional use, apraxia, decr proprioception, decr coordination affecting ADL performance.  Pt would benefit from occupational therapy to address these deficits in order to improve quality of life, incr ease/independence with ADLs, and improve dominant RUE functional use.   Rehab Potential Fair   Clinical Impairments Affecting Rehab Potential severity of cognitive deficits   OT Frequency 2x / week   OT Duration 6 weeks  +eval or 12 visits   OT Treatment/Interventions Self-care/ADL training;Therapeutic exercise;DME and/or AE instruction;Therapeutic activities;Patient/family education;Cognitive remediation/compensation;Neuromuscular education;Visual/perceptual remediation/compensation   Plan simple writing strategies, ADL strategies, simple coordination/visual perceptual activities   Recommended Other Services none at this time   Consulted and Agree with Plan of Care Patient;Family member/caregiver   Family Member Consulted husband      Patient will benefit from skilled therapeutic intervention in order to improve the following deficits and impairments:  Decreased coordination, Impaired UE functional use, Impaired perceived functional ability, Decreased cognition, Impaired sensation, Impaired vision/preception (apraxia)  Visit Diagnosis: Apraxia  Cognitive communication deficit  Attention and concentration deficit  Other lack of coordination      G-Codes - 01/07/16 [id number]    Functional Assessment Tool Used needs  mod cues for simple words, writes  name with min cues and approx 50% legibility with name   Functional Limitation Self care   Self Care Current Status [phone]) At least 80 percent but less than 100 percent impaired, limited or restricted   Self Care Goal Status [id number]) At least 60 percent but less than 80 percent impaired, limited or restricted      Problem List Patient Active Problem List   Diagnosis Date Noted  . Chronic atrial fibrillation ([place]) 06/29/2015  . Vascular dementia with depressed mood 06/29/2015  . 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 Paroxysmal atrial fibrillation ([place]) 03/30/2015  . Low hemoglobin 04/01/2014  . History of echocardiogram   . Sick sinus syndrome ([place])   . Carotid artery disease ([place])   . Monoclonal gammopathy of undetermined significance   . HLD (hyperlipidemia)   . Hypertensive cardiovascular disease   . Back pain 11/13/2013  . Angina at rest [facility]) 11/13/2013  . Symptomatic sinus bradycardia 11/13/2013  . Aortic valve sclerosis 07/10/2013  . Chest pain 10/18/2012  . Sinus bradycardia 10/18/2012  . Dizzy spells 10/18/2012  . PVC's (premature ventricular contractions) 09/25/2012  . Anginal pain ([place])   . Coronary artery disease, hx of stents to RCA   . Nosebleed   . 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 History of hypercholesterolemia   . Left bundle-branch block   . Tobacco abuse     [facility] [place] 01/05/2016, 9:27 PM  [facility] [address] [place], [place], [address] Phone: [phone]   Fax:  [phone]  Name: Katherine [name] MRN: [id number] Date of Birth: December 05, 1945   Vianne Bulls, OTR/L [facility] [address] [place], [place]  [address] [phone] phone [phone] 01/05/16 9:27 PM

## 2016-01-11 ENCOUNTER — Encounter: Payer: Self-pay | Admitting: Cardiovascular Disease

## 2016-01-11 ENCOUNTER — Ambulatory Visit (INDEPENDENT_AMBULATORY_CARE_PROVIDER_SITE_OTHER): Payer: Medicare Other | Admitting: Cardiovascular Disease

## 2016-01-11 VITALS — BP 110/60 | HR 74 | Ht 65.0 in | Wt 135.8 lb

## 2016-01-11 DIAGNOSIS — I495 Sick sinus syndrome: Secondary | ICD-10-CM

## 2016-01-11 DIAGNOSIS — I2510 Atherosclerotic heart disease of native coronary artery without angina pectoris: Secondary | ICD-10-CM

## 2016-01-11 DIAGNOSIS — E785 Hyperlipidemia, unspecified: Secondary | ICD-10-CM

## 2016-01-11 DIAGNOSIS — I779 Disorder of arteries and arterioles, unspecified: Secondary | ICD-10-CM

## 2016-01-11 DIAGNOSIS — I10 Essential (primary) hypertension: Secondary | ICD-10-CM

## 2016-01-11 DIAGNOSIS — I739 Peripheral vascular disease, unspecified: Secondary | ICD-10-CM

## 2016-01-11 NOTE — Progress Notes (Signed)
Chief Complaint  Patient presents with  . Follow-up     History of Present Illness: 70 yo female with history of CAD, carotid artery disease, HLD, HTN, LBBB, MGUS, PAF, sick sinus syndrome s/p pacemaker who is here today for cardiac follow up She has been followed by Dr. Mare Ferrari. I am meeting her for the first time today. Her cardiac history includes pacemaker placed in July 2015. This is followed by Dr. Rayann Heman. She has known CAD with placement of overlapping DES x 2 in the RCA in 2012. Last cath in July 2014 with mild disease in the LAD and Circumflex, patent RCA stents with mild restenosis. She has known LBBB. Last echo July 2015 with normal LV systolic function. She has progressive dementia.   She is here today for follow up. She denies any exertional chest pain or SOB. She did have one episode of pain in her right breast last week. This lasted for several hours. This has resolved. This was not like her prior chest pain before here stent was placed. Her husband asked [place] today not to mention her dementia as she is not aware of this.   Primary Care Physician: Thressa Sheller, MD  Past Medical History:  Diagnosis Date  . Carotid artery disease ([place])    a. Hx r carotid bruit - dopplers 2009 <50% bilat;  b. 10/2012 U/S: no hemodynamically signif stenosis.  . Coronary artery disease    a. s/p overlapping DES to RCA 04/2010 (days after neg stress test). b. Cath 06/2010 - patent stents, nonobst dz otherwise; c. 10/2012 Cath: LM nl, LAD 71m, D1/2/3 nl, LCX 20p, OM2 50p, RCA 20% ISR mid, EF 55-60% ->Med Rx.    [name] History of echocardiogram    a. 2D ECHO: 11/14/2013: EF 60-65%, mild concentric LVH, no RWMAs. G1DD. Mod LA dilation, mild RA dilation, PA pressure 31.  [name] HLD (hyperlipidemia)   . Hypertension   . Left bundle-branch block   . Monoclonal gammopathy of undetermined significance    a. Previously seen by heme - this is per chart but pt does not recall.  . Paroxysmal atrial fibrillation ([place])  03/30/2015   observed on pacemaker interrogation, longest episode 2 minutes  . PVC's (premature ventricular contractions)   . Sick sinus syndrome ([place])    a. s/p PPM on 11/14/13 with Medtronic Adapta L model ADDRL 1 (serial number [id number]) pacemaker  . Tobacco abuse     Past Surgical History:  Procedure Laterality Date  . CARDIAC CATHETERIZATION  05/14/10   SHOWED EVIDENCE OF REVERSIBLE VASOSPASM  . INGUINAL HERNIA REPAIR    . LEFT HEART CATHETERIZATION WITH CORONARY ANGIOGRAM N/A 10/17/2012   Procedure: LEFT HEART CATHETERIZATION WITH CORONARY ANGIOGRAM;  Surgeon: Wellington Hampshire, MD;  Location: [facility] CATH [facility];  Service: Cardiovascular;  Laterality: N/A;  . PACEMAKER INSERTION  11/14/13   MDT Adapta L implanted by Dr Rayann Heman for Sick sinus syndrome  . PCI     HIGH GRADE LESION IN THE MIDPORTION OF THE RIGHT CORONARY ARTERY WHICH WAS A LARGE ARTERY  . PERMANENT PACEMAKER INSERTION N/A 11/14/2013   Procedure: PERMANENT PACEMAKER INSERTION;  Surgeon: Coralyn Mark, MD;  Location: [facility] CATH LAB;  Service: Cardiovascular;  Laterality: N/A;  . TONSILLECTOMY      Current Outpatient Prescriptions  Medication Sig Dispense Refill  . Ascorbic Acid (VITAMIN C) 500 MG tablet Take 250 mg by mouth daily.     [name] aspirin 325 MG tablet Take 325 mg by mouth daily.    [name]  donepezil (ARICEPT) 10 MG tablet Take 1 tablet (10 mg total) by mouth at bedtime. 90 tablet 3  . ibuprofen (ADVIL,MOTRIN) 600 MG tablet Take 600 mg by mouth every 6 (six) hours as needed (pain).    [name] levothyroxine (SYNTHROID, LEVOTHROID) 25 MCG tablet TK 1 T PO QD  1  . losartan (COZAAR) 25 MG tablet Take 1 tablet (25 mg total) by mouth daily. 90 tablet 1  . memantine (NAMENDA) 10 MG tablet Take 1 tablet (10 mg total) by mouth 2 (two) times daily. 60 tablet 11  . nitroGLYCERIN (NITROSTAT) 0.4 MG SL tablet Place 0.4 mg under the tongue every 5 (five) minutes as needed for chest pain (MAX 3 TABLETS).     . pravastatin (PRAVACHOL) 40 MG tablet  Take 40 mg by mouth daily.     No current facility-administered medications for this visit.     Allergies  Allergen Reactions  . Isosorbide Nitrate Swelling    SWELLING OF THE TONGUE - pt states she received this after PNA ? But has been able to tolerate SL nitroglycerin without adverse reaction.  . Metoprolol Succinate [Metoprolol] Other (See Comments)    Dizziness, diarrhea, tingling in fingers  . Zocor [Simvastatin] Other (See Comments)    Leg pain - does not tolerate high doses of statins  . Ezetimibe Other (See Comments)    unknown  . Plavix [Clopidogrel Bisulfate] Other (See Comments)    Prior P2Y12 showing 267 (poor Plavix responsiveness)  . Sulfa Antibiotics Other (See Comments)    Sulfa drugs as a child. UNKNOWN REACTION  . Sulfonamide Derivatives Other (See Comments)    Sulfa drugs as a child. UNKNOWN REACTION    Social History   Social History  . Marital status: Married    Spouse name: N/A  . Number of children: 2  . Years of education: N/A   Occupational History  . Glass blower/designer for a lawyer-retired    Social History Main Topics  . Smoking status: Former Smoker    Quit date: 01/10/1974  . Smokeless tobacco: Not on file     Comment: Smoking for over 45 years  . Alcohol use No  . Drug use: No  . Sexual activity: Not on file   Other Topics Concern  . Not on file   Social History Narrative   Lives at home w/ her husband   Right-handed    Family History  Problem Relation Age of Onset  . Cancer Father   . Heart disease Father     Review of Systems:  As stated in the HPI and otherwise negative.   BP 110/60 (BP Location: Left Arm, Patient Position: Sitting, Cuff Size: Normal)   Pulse 74   Ht 5\' 5"  (1.651 m)   Wt 61.6 kg (135 lb 12.8 oz)   BMI 22.60 kg/m   Physical Examination: General: Well developed, well nourished, NAD  HEENT: OP clear, mucus membranes moist  SKIN: warm, dry. No rashes. Neuro: No focal deficits  Musculoskeletal: Muscle  strength 5/5 all ext  Psychiatric: Mood and affect normal  Neck: No JVD, no carotid bruits, no thyromegaly, no lymphadenopathy.  Lungs:Clear bilaterally, no wheezes, rhonci, crackles Cardiovascular: Regular rate and rhythm. No murmurs, gallops or rubs. Abdomen:Soft. Bowel sounds present. Non-tender.  Extremities: No lower extremity edema. Pulses are 2 + in the bilateral DP/PT.  Echo July 2015: Left ventricle: The cavity size was normal. There was mild concentric hypertrophy. Systolic function was normal. The estimated ejection fraction was in the  range of 60% to 65%. Wall motion was normal; there were no regional wall motion abnormalities. Doppler parameters are consistent with abnormal left ventricular relaxation (grade 1 diastolic dysfunction). - Mitral valve: Calcified annulus. - Left atrium: The atrium was moderately dilated. - Right atrium: The atrium was mildly dilated. - Pulmonary arteries: PA peak pressure: 31 mm Hg (S).  EKG:  EKG is ordered today. The ekg ordered today demonstrates Atrial paced. LBBB.   Recent Labs: 06/29/2015: ALT 7; BUN 15; Creatinine, Ser 0.81; Potassium 5.0; Sodium 142   Lipid Panel  Wt Readings from Last 3 Encounters:  01/11/16 61.6 kg (135 lb 12.8 oz)  11/02/15 62.3 kg (137 lb 6.4 oz)  08/03/15 63 kg (139 lb)     Other studies Reviewed: Additional studies/ records that were reviewed today include:  Review of the above records demonstrates:   Assessment and Plan:   1. CAD: She has had no chest pain suggestive of angina. She is on ASA, ARB and statin.    2. Sick sinus syndrome: She has a pacemaker in place. Followed by Dr. Rayann Heman.   3. HTN: BP controlled today. No changes.   4. HLD: She is on a statin. Lipid followed in primary care office.   Current medicines are reviewed at length with the patient today.  The patient does not have concerns regarding medicines.  The following changes have been made:  no change  Labs/ tests  ordered today include:   Orders Placed This Encounter  Procedures  . EKG 12-Lead    Disposition:   FU with me in 6 months  Signed, Lauree Chandler, MD 01/11/2016 4:59 PM    [facility] [place], [place], [place]  [address] Phone: [phone]; Fax: [phone]

## 2016-01-11 NOTE — Patient Instructions (Addendum)
Medication Instructions:  Your physician recommends that you continue on your current medications as directed. Please refer to the Current Medication list given to you today.   Labwork: none  Testing/Procedures: Your physician has requested that you have a carotid duplex. This test is an ultrasound of the carotid arteries in your neck. It looks at blood flow through these arteries that supply the brain with blood. Allow one hour for this exam. There are no restrictions or special instructions.    Follow-Up: Your physician wants you to follow-up in: 6 months.  You will receive a reminder letter in the mail two months in advance. If you don't receive a letter, please call our office to schedule the follow-up appointment.   Your physician recommends that you schedule a follow-up appointment in: December with Chanetta Marshall, NP    Any Other Special Instructions Will Be Listed Below (If Applicable).     If you need a refill on your cardiac medications before your next appointment, please call your pharmacy.

## 2016-01-14 ENCOUNTER — Ambulatory Visit (HOSPITAL_COMMUNITY)
Admission: RE | Admit: 2016-01-14 | Discharge: 2016-01-14 | Disposition: A | Discharge status: Home / Self Care | Payer: Medicare Other | Source: Ambulatory Visit | Attending: Cardiovascular Disease | Admitting: Cardiovascular Disease

## 2016-01-14 DIAGNOSIS — Z87891 Personal history of nicotine dependence: Secondary | ICD-10-CM | POA: Insufficient documentation

## 2016-01-14 DIAGNOSIS — I2510 Atherosclerotic heart disease of native coronary artery without angina pectoris: Secondary | ICD-10-CM | POA: Insufficient documentation

## 2016-01-14 DIAGNOSIS — I779 Disorder of arteries and arterioles, unspecified: Secondary | ICD-10-CM | POA: Diagnosis present

## 2016-01-14 DIAGNOSIS — I6523 Occlusion and stenosis of bilateral carotid arteries: Secondary | ICD-10-CM | POA: Insufficient documentation

## 2016-01-14 DIAGNOSIS — I739 Peripheral vascular disease, unspecified: Secondary | ICD-10-CM

## 2016-01-14 DIAGNOSIS — E785 Hyperlipidemia, unspecified: Secondary | ICD-10-CM | POA: Insufficient documentation

## 2016-01-18 ENCOUNTER — Ambulatory Visit: Payer: Medicare Other | Attending: Neurology | Admitting: Occupational Therapy

## 2016-01-18 DIAGNOSIS — R278 Other lack of coordination: Secondary | ICD-10-CM | POA: Diagnosis not present

## 2016-01-18 DIAGNOSIS — R482 Apraxia: Secondary | ICD-10-CM | POA: Insufficient documentation

## 2016-01-18 DIAGNOSIS — R41841 Cognitive communication deficit: Secondary | ICD-10-CM | POA: Diagnosis present

## 2016-01-18 DIAGNOSIS — R41840 Attention and concentration deficit: Secondary | ICD-10-CM

## 2016-01-18 NOTE — Therapy (Signed)
[facility] [address] [place], [place], [address] Phone: [phone]   Fax:  [phone]  Occupational Therapy Treatment  Patient Details  Name: Katherine [name] MRN: [id number] Date of Birth: September 19, 1945 Referring Provider: Dr. Asencion Partridge Dohmeier  Encounter Date: 01/18/2016      OT End of Session - 01/18/16 1410    Visit Number 2   Number of Visits 13   Date for OT Re-Evaluation 03/05/16   Authorization Type UHC Medicare, G-code needed, no visit limit/auth   Authorization - Visit Number 2   Authorization - Number of Visits 10   OT Start Time [id number]   OT Stop Time 1445   OT Time Calculation (min) 40 min   Activity Tolerance Patient tolerated treatment well   Behavior During Therapy Restless;Anxious      Past Medical History:  Diagnosis Date  . Carotid artery disease ([place])    a. Hx r carotid bruit - dopplers 2009 <50% bilat;  b. 10/2012 U/S: no hemodynamically signif stenosis.  . Coronary artery disease    a. s/p overlapping DES to RCA 04/2010 (days after neg stress test). b. Cath 06/2010 - patent stents, nonobst dz otherwise; c. 10/2012 Cath: LM nl, LAD 67m, D1/2/3 nl, LCX 20p, OM2 50p, RCA 20% ISR mid, EF 55-60% ->Med Rx.    [name] History of echocardiogram    a. 2D ECHO: 11/14/2013: EF 60-65%, mild concentric LVH, no RWMAs. G1DD. Mod LA dilation, mild RA dilation, PA pressure 31.  [name] HLD (hyperlipidemia)   . Hypertension   . Left bundle-branch block   . Monoclonal gammopathy of undetermined significance    a. Previously seen by heme - this is per chart but pt does not recall.  . Paroxysmal atrial fibrillation ([place]) 03/30/2015   observed on pacemaker interrogation, longest episode 2 minutes  . PVC's (premature ventricular contractions)   . Sick sinus syndrome ([place])    a. s/p PPM on 11/14/13 with Medtronic Adapta L model ADDRL 1 (serial number [id number]) pacemaker  . Tobacco abuse     Past Surgical History:  Procedure  Laterality Date  . CARDIAC CATHETERIZATION  05/14/10   SHOWED EVIDENCE OF REVERSIBLE VASOSPASM  . INGUINAL HERNIA REPAIR    . LEFT HEART CATHETERIZATION WITH CORONARY ANGIOGRAM N/A 10/17/2012   Procedure: LEFT HEART CATHETERIZATION WITH CORONARY ANGIOGRAM;  Surgeon: [place] Hampshire, MD;  Location: [facility] C[facility];  Service: Cardiovascular;  Laterality: N/A;  . PACEMAKER INSERTION  11/14/13   MDT Adapta L implanted by Dr Rayann Heman for Sick sinus syndrome  . PCI     HIGH GRADE LESION IN THE MIDPORTION OF THE RIGHT CORONARY ARTERY WHICH WAS A LARGE ARTERY  . PERMANENT PACEMAKER INSERTION N/A 11/14/2013   Procedure: PERMANENT PACEMAKER INSERTION;  Surgeon: Coralyn Mark, MD;  Location: [facility];  Service: Cardiovascular;  Laterality: N/A;  . TONSILLECTOMY      There were no vitals filed for this visit.      Subjective Assessment - 01/18/16 1504    Subjective  "I can't believe that I can't do this"  "We were worried that it was Alzheimers, but the doctor said it was mini-strokes"   Patient is accompained by: Family member  husband in lobby (reviewed HEP with husband), but reports that pt didn't want him to come back for session   Pertinent History Alzheimer's disease, pacemaker, cardiac stents   Limitations Pacemaker, **per epic notes husband does not want Alheimer's diagnosed discussed in front of  pt**   Patient Stated Goals improve writing, being able to don belt   Currently in Pain? No/denies        Pt was able to fold towels with min decr in accuracy using BUEs.  Tracing simple shapes with mod cueing and decr in accuracy, particularly on R side of page.    Following simple paths for pen control with highlighter with mod difficulty, decr accuracy on R side of page/at end of path.  Pt with mod-max difficulty coloring simple shape and only colored approx 50% of space.  Writing name with up to approx 85% legibility with repetition.  Difficulty initiating and needed  prompts.                        OT Education - 01/18/16 1508    Education Details Simple HEP--see pt instructions (also given tracing simple shapes and tracing simple paths worksheet)   Person(s) Educated Patient;Spouse  reviewed with husband at end of session   Methods Explanation;Handout;Verbal cues;Tactile cues;Demonstration   Comprehension Verbalized understanding;Returned demonstration;Verbal cues required;Tactile cues required;Need further instruction  mod cueing          OT Short Term Goals - 01/05/16 2119      OT SHORT TERM GOAL #1   Title Pt will perform HEP with min-mod cueing.--check STGs 02/17/16   Period --   Status New     OT SHORT TERM GOAL #2   Title Pt will be able to write name with 100% legibility in 3/5 trials.   Status New           OT Long Term Goals - 01/05/16 2113      OT LONG TERM GOAL #1   Title Pt will be able to write simple phrase with 75% legibility.--check LTGs 03/05/16   Status New     OT LONG TERM GOAL #2   Title Pt will be able to complete buttons at least 90% of the time without assist.   Status New     OT LONG TERM GOAL #3   Title Pt will be able to don/fasten belt mod I at least 90% of the time.   Status New     OT LONG TERM GOAL #4   Title --     OT LONG TERM GOAL #5   Title --               Plan - 01/18/16 1417    Clinical Impression Statement Pt demo improved ability to write name with repetition.  Pt needed cueing to use dominant RUE for functional tasks during session (particularly for challenging tasks).  Pt also appeared to have difficulty on R side of the page (R visual field)   Rehab Potential Fair   Clinical Impairments Affecting Rehab Potential severity of cognitive deficits   OT Frequency 2x / week   OT Duration 6 weeks  +eval or 12 visits   OT Treatment/Interventions Self-care/ADL training;Therapeutic exercise;DME and/or AE instruction;Therapeutic activities;Patient/family  education;Cognitive remediation/compensation;Neuromuscular education;Visual/perceptual remediation/compensation   Plan simple writing, tracing, simple coordination/visual perceptual activities   Consulted and Agree with Plan of Care Patient;Family member/caregiver   Family Member Consulted husband      Patient will benefit from skilled therapeutic intervention in order to improve the following deficits and impairments:  Decreased coordination, Impaired UE functional use, Impaired perceived functional ability, Decreased cognition, Impaired sensation, Impaired vision/preception (apraxia)  Visit Diagnosis: Other lack of coordination  Attention and concentration deficit  Problem List Patient Active Problem List   Diagnosis Date Noted  . Chronic atrial fibrillation ([place]) 06/29/2015  . Vascular dementia with depressed mood 06/29/2015  . 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 Paroxysmal atrial fibrillation ([place]) 03/30/2015  . Low hemoglobin 04/01/2014  . History of echocardiogram   . Sick sinus syndrome ([place])   . Carotid artery disease ([place])   . Monoclonal gammopathy of undetermined significance   . HLD (hyperlipidemia)   . Hypertensive cardiovascular disease   . Back pain 11/13/2013  . Angina at rest [facility]) 11/13/2013  . Symptomatic sinus bradycardia 11/13/2013  . Aortic valve sclerosis 07/10/2013  . Chest pain 10/18/2012  . Sinus bradycardia 10/18/2012  . Dizzy spells 10/18/2012  . PVC's (premature ventricular contractions) 09/25/2012  . Anginal pain ([place])   . Coronary artery disease, hx of stents to RCA   . Nosebleed   . 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 History of hypercholesterolemia   . Left bundle-branch block   . Tobacco abuse     [facility] 01/18/2016, 4:08 PM  [facility] [address] [place], [place], [address] Phone: [phone]   Fax:   [phone]  Name: Katherine [name] MRN: [id number] Date of Birth: 22-Aug-1945   Vianne Bulls, OTR/L [facility] [address] [place], [place]  [address] [phone] phone [phone] 01/18/16 4:08 PM

## 2016-01-18 NOTE — Patient Instructions (Addendum)
  Fine Motor Coordination Exercises  Perform the following exercises 1 times a day with right hand, as recommended by your occupational therapist.  Perform in sitting, place oven mitt over left hand.   Place clothespins onto the edge of a cup, can, or container.   Stack checkers  Pick up coins and place in coin bank or container 1 at a time  Flip playing cards over  Put empty clothes hangers on a rack, remove, and repeat  Trace simple shapes with highlighter  Color simple shapes/easy coloring book  Practice writing name

## 2016-01-20 ENCOUNTER — Ambulatory Visit: Payer: Medicare Other | Admitting: Occupational Therapy

## 2016-01-20 DIAGNOSIS — R278 Other lack of coordination: Secondary | ICD-10-CM

## 2016-01-20 DIAGNOSIS — R41841 Cognitive communication deficit: Secondary | ICD-10-CM

## 2016-01-20 DIAGNOSIS — R41840 Attention and concentration deficit: Secondary | ICD-10-CM

## 2016-01-20 NOTE — Therapy (Signed)
[facility] [address] [place], [place], [address] Phone: [phone]   Fax:  [phone]  Occupational Therapy Treatment  Patient Details  Name: Katherine [name] MRN: [id number] Date of Birth: [birth year] Referring Provider: Dr. Asencion Partridge Dohmeier  Encounter Date: 01/20/2016      OT End of Session - 01/20/16 1420    Visit Number 3   Number of Visits 13   Date for OT Re-Evaluation 03/05/16   Authorization Type UHC Medicare, G-code needed, no visit limit/auth   Authorization - Visit Number 3   Authorization - Number of Visits 10   OT Start Time 1408   OT Stop Time 1446   OT Time Calculation (min) 38 min   Activity Tolerance Patient tolerated treatment well   Behavior During Therapy Restless  tearful during session with difficulty, perseverated with speech      Past Medical History:  Diagnosis Date  . Carotid artery disease ([place])    a. Hx r carotid bruit - dopplers 2009 <50% bilat;  b. 10/2012 U/S: no hemodynamically signif stenosis.  . Coronary artery disease    a. s/p overlapping DES to RCA 04/2010 (days after neg stress test). b. Cath 06/2010 - patent stents, nonobst dz otherwise; c. 10/2012 Cath: LM nl, LAD 33m, D1/2/3 nl, LCX 20p, OM2 50p, RCA 20% ISR mid, EF 55-60% ->Med Rx.    [name] History of echocardiogram    a. 2D ECHO: 11/14/2013: EF 60-65%, mild concentric LVH, no RWMAs. G1DD. Mod LA dilation, mild RA dilation, PA pressure 31.  [name] HLD (hyperlipidemia)   . Hypertension   . Left bundle-branch block   . Monoclonal gammopathy of undetermined significance    a. Previously seen by heme - this is per chart but pt does not recall.  . Paroxysmal atrial fibrillation ([place]) 03/30/2015   observed on pacemaker interrogation, longest episode 2 minutes  . PVC's (premature ventricular contractions)   . Sick sinus syndrome ([place])    a. s/p PPM on 11/14/13 with Medtronic Adapta L model ADDRL 1 (serial number [id number]) pacemaker  .  Tobacco abuse     Past Surgical History:  Procedure Laterality Date  . CARDIAC CATHETERIZATION  05/14/10   SHOWED EVIDENCE OF REVERSIBLE VASOSPASM  . INGUINAL HERNIA REPAIR    . LEFT HEART CATHETERIZATION WITH CORONARY ANGIOGRAM N/A 10/17/2012   Procedure: LEFT HEART CATHETERIZATION WITH CORONARY ANGIOGRAM;  Surgeon: [place] Hampshire, MD;  Location: [facility] CATH [facility];  Service: Cardiovascular;  Laterality: N/A;  . PACEMAKER INSERTION  11/14/13   MDT Adapta L implanted by Dr Rayann Heman for Sick sinus syndrome  . PCI     HIGH GRADE LESION IN THE MIDPORTION OF THE RIGHT CORONARY ARTERY WHICH WAS A LARGE ARTERY  . PERMANENT PACEMAKER INSERTION N/A 11/14/2013   Procedure: PERMANENT PACEMAKER INSERTION;  Surgeon: Coralyn Mark, MD;  Location: [facility] CATH [facility];  Service: Cardiovascular;  Laterality: N/A;  . TONSILLECTOMY      There were no vitals filed for this visit.      Subjective Assessment - 01/20/16 1419    Subjective  "I can't believe this is happening.  This should be so simple"   Pertinent History Alzheimer's disease, pacemaker, cardiac stents   Limitations Pacemaker, **per epic notes husband does not want Alheimer's diagnosed discussed in front of pt**   Patient Stated Goals improve writing, being able to don belt   Currently in Pain? No/denies  Tracing simple shapes with min cueing to slow down and for directions and decr in accuracy overall.  Following simple paths for pen control with highlighter with min difficulty/decr accuracy and cueing to slow down/for directions.                Flipping cards with R hand with min cueing/oven mitt placed over L to incr attention to RUE and improve RUE functional use and min cueing for directions throughout task.  Picking up coins with R hand to place in coin bank with min cueing for directions, to use RUE, and avoid dragging to edge of table.    Writing name with up to approx 90% legibility with repetition.  However mod difficulty  initiating and needed mod prompts.   Attempted to self generate the alphabet to write each letter.  With cueing for each letter, pt able to write A-C, but then unable to write D or copy D with prompts.  Pt copied A-I with mod cues/prompts for initiating.    Pt overall needed cueing for initiation and to continue repetitive activities thoughout session but needed less cueing that last session.  Pt/husband report doing HEP at home.                        OT Short Term Goals - 01/05/16 2119      OT SHORT TERM GOAL #1   Title Pt will perform HEP with min-mod cueing.--check STGs 02/17/16   Period --   Status New     OT SHORT TERM GOAL #2   Title Pt will be able to write name with 100% legibility in 3/5 trials.   Status New           OT Long Term Goals - 01/05/16 2113      OT LONG TERM GOAL #1   Title Pt will be able to write simple phrase with 75% legibility.--check LTGs 03/05/16   Status New     OT LONG TERM GOAL #2   Title Pt will be able to complete buttons at least 90% of the time without assist.   Status New     OT LONG TERM GOAL #3   Title Pt will be able to don/fasten belt mod I at least 90% of the time.   Status New     OT LONG TERM GOAL #4   Title --     OT LONG TERM GOAL #5   Title --               Plan - 01/20/16 1420    Clinical Impression Statement Pt demo improved ability to trace to simple shapes today and incr pen control and ability to write name.  Pt also needed less cueing to use RUE for coordination tasks.  However, pt continue to demo difficulty initiating writing and with novel tasks due to dementia.   Rehab Potential Fair   Clinical Impairments Affecting Rehab Potential severity of cognitive deficits (Alzheimer's disease)   OT Frequency 2x / week   OT Duration 6 weeks  +eval or 12 visits   OT Treatment/Interventions Self-care/ADL training;Therapeutic exercise;DME and/or AE instruction;Therapeutic  activities;Patient/family education;Cognitive remediation/compensation;Neuromuscular education;Visual/perceptual remediation/compensation   Plan simple writing, tracing, simple coordination/visual activities   Consulted and Agree with Plan of Care Patient;Family member/caregiver   Family Member Consulted husband      Patient will benefit from skilled therapeutic intervention in order to improve the following deficits and impairments:  Decreased coordination,  Impaired UE functional use, Impaired perceived functional ability, Decreased cognition, Impaired sensation, Impaired vision/preception (apraxia)  Visit Diagnosis: Other lack of coordination  Cognitive communication deficit  Attention and concentration deficit    Problem List Patient Active Problem List   Diagnosis Date Noted  . Chronic atrial fibrillation ([place]) 06/29/2015  . Vascular dementia with depressed mood 06/29/2015  . 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 Paroxysmal atrial fibrillation ([place]) 03/30/2015  . Low hemoglobin 04/01/2014  . History of echocardiogram   . Sick sinus syndrome ([place])   . Carotid artery disease ([place])   . Monoclonal gammopathy of undetermined significance   . HLD (hyperlipidemia)   . Hypertensive cardiovascular disease   . Back pain 11/13/2013  . Angina at rest [facility]) 11/13/2013  . Symptomatic sinus bradycardia 11/13/2013  . Aortic valve sclerosis 07/10/2013  . Chest pain 10/18/2012  . Sinus bradycardia 10/18/2012  . Dizzy spells 10/18/2012  . PVC's (premature ventricular contractions) 09/25/2012  . Anginal pain ([place])   . Coronary artery disease, hx of stents to RCA   . Nosebleed   . 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 History of hypercholesterolemia   . Left bundle-branch block   . Tobacco abuse     [facility] 01/20/2016, 3:04 PM  [facility] [address] [place],  [place], [address] Phone: [phone]   Fax:  [phone]  Name: Katherine [name] MRN: [id number] Date of Birth: 1945-06-29

## 2016-02-01 ENCOUNTER — Ambulatory Visit: Payer: Medicare Other | Admitting: Occupational Therapy

## 2016-02-01 DIAGNOSIS — R41840 Attention and concentration deficit: Secondary | ICD-10-CM

## 2016-02-01 DIAGNOSIS — R278 Other lack of coordination: Secondary | ICD-10-CM

## 2016-02-01 DIAGNOSIS — R41841 Cognitive communication deficit: Secondary | ICD-10-CM

## 2016-02-01 DIAGNOSIS — R482 Apraxia: Secondary | ICD-10-CM

## 2016-02-01 NOTE — Therapy (Signed)
[facility] [address] [place], [place], [address] Phone: [phone]   Fax:  [phone]  Occupational Therapy Treatment  Patient Details  Name: Katherine [name] MRN: [id number] Date of Birth: 25-Dec-1945 Referring Provider: Dr. Asencion Partridge Dohmeier  Encounter Date: 02/01/2016      OT End of Session - 02/01/16 1625    Visit Number 4   Number of Visits 13   Date for OT Re-Evaluation 03/05/16   Authorization Type UHC Medicare, G-code needed, no visit limit/auth   Authorization - Visit Number 4   Authorization - Number of Visits 10   OT Start Time [id number]   OT Stop Time 1417   OT Time Calculation (min) 39 min   Activity Tolerance Patient tolerated treatment well   Behavior During Therapy Restless  perseverated with speech, easily frustrated      Past Medical History:  Diagnosis Date  . Carotid artery disease ([place])    a. Hx r carotid bruit - dopplers 2009 <50% bilat;  b. 10/2012 U/S: no hemodynamically signif stenosis.  . Coronary artery disease    a. s/p overlapping DES to RCA 04/2010 (days after neg stress test). b. Cath 06/2010 - patent stents, nonobst dz otherwise; c. 10/2012 Cath: LM nl, LAD 3m, D1/2/3 nl, LCX 20p, OM2 50p, RCA 20% ISR mid, EF 55-60% ->Med Rx.    [name] History of echocardiogram    a. 2D ECHO: 11/14/2013: EF 60-65%, mild concentric LVH, no RWMAs. G1DD. Mod LA dilation, mild RA dilation, PA pressure 31.  [name] HLD (hyperlipidemia)   . Hypertension   . Left bundle-branch block   . Monoclonal gammopathy of undetermined significance    a. Previously seen by heme - this is per chart but pt does not recall.  . Paroxysmal atrial fibrillation ([place]) 03/30/2015   observed on pacemaker interrogation, longest episode 2 minutes  . PVC's (premature ventricular contractions)   . Sick sinus syndrome ([place])    a. s/p PPM on 11/14/13 with Medtronic Adapta L model ADDRL 1 (serial number [id number]) pacemaker  . Tobacco abuse      Past Surgical History:  Procedure Laterality Date  . CARDIAC CATHETERIZATION  05/14/10   SHOWED EVIDENCE OF REVERSIBLE VASOSPASM  . INGUINAL HERNIA REPAIR    . LEFT HEART CATHETERIZATION WITH CORONARY ANGIOGRAM N/A 10/17/2012   Procedure: LEFT HEART CATHETERIZATION WITH CORONARY ANGIOGRAM;  Surgeon: [place] Hampshire, MD;  Location: [facility] CATH LAB;  Service: Cardiovascular;  Laterality: N/A;  . PACEMAKER INSERTION  11/14/13   MDT Adapta L implanted by Dr Rayann Heman for Sick sinus syndrome  . PCI     HIGH GRADE LESION IN THE MIDPORTION OF THE RIGHT CORONARY ARTERY WHICH WAS A LARGE ARTERY  . PERMANENT PACEMAKER INSERTION N/A 11/14/2013   Procedure: PERMANENT PACEMAKER INSERTION;  Surgeon: Coralyn Mark, MD;  Location: [facility] CATH LAB;  Service: Cardiovascular;  Laterality: N/A;  . TONSILLECTOMY      There were no vitals filed for this visit.     Following simple paths and tracing shapes for pen control with highlighter with very min decr accuracy.  Flipping cards with R hand with min-mod cueing/oven mitt placed over L to incr attention to RUE and improve RUE functional use and min cueing for directions throughout task.  Writing name with good legibility x5 today.     Attempted to write simple phrases with max prompts/cues.  Pt able to write with max prompts (spelling verbally 1 letter at a time and  copying).  However, pt demo incr difficulty by end of session likely due to fatigue, decr attention, and frustration.  Pt overall needed cueing for initiation and to continue repetitive activities thoughout session.   Pt/husband report doing HEP at home.                        OT Education - 02/01/16 1654    Education Details Homework (practice phrases and words)--see pt instructions   Person(s) Educated Patient;Spouse   Methods Explanation;Handout   Comprehension Verbalized understanding          OT Short Term Goals - 02/01/16 1631      OT SHORT TERM GOAL #1   Title  Pt will perform HEP with min-mod cueing.--check STGs 02/17/16   Status New     OT SHORT TERM GOAL #2   Title Pt will be able to write name with 100% legibility in 3/5 trials.   Status Achieved  02/01/16  good legibility 5/5 trials            OT Long Term Goals - 02/01/16 1637      OT LONG TERM GOAL #1   Title Pt will be able to write simple phrase with 75% legibility.--check LTGs 03/05/16   Status New     OT LONG TERM GOAL #2   Title Pt will be able to complete buttons at least 90% of the time without assist.   Status New     OT LONG TERM GOAL #3   Title Pt will be able to don/fasten belt mod I at least 90% of the time.   Status New               Plan - 02/01/16 1629    Clinical Impression Statement Pt demo improved ability to trace simple shapes today with incr accuracy and demo improved ability to write name.  However, pt continue to demo difficulty with novel tasks and need cueing to use RUE for non-writing tasks.   Rehab Potential Fair   Clinical Impairments Affecting Rehab Potential severity of cognitive deficits (Alzheimer's disease)   OT Frequency 2x / week   OT Duration 6 weeks  +eval or 12 visits   OT Treatment/Interventions Self-care/ADL training;Therapeutic exercise;DME and/or AE instruction;Therapeutic activities;Patient/family education;Cognitive remediation/compensation;Neuromuscular education;Visual/perceptual remediation/compensation   Plan buttoning, continue with practicing writing simple phrases/name, coordination (pick up coins, stack checkers, clothespins)   Consulted and Agree with Plan of Care Patient;Family member/caregiver   Family Member Consulted husband      Patient will benefit from skilled therapeutic intervention in order to improve the following deficits and impairments:  Decreased coordination, Impaired UE functional use, Impaired perceived functional ability, Decreased cognition, Impaired sensation, Impaired vision/preception  (apraxia)  Visit Diagnosis: Other lack of coordination  Attention and concentration deficit  Cognitive communication deficit  Apraxia    Problem List Patient Active Problem List   Diagnosis Date Noted  . Chronic atrial fibrillation ([place]) 06/29/2015  . Vascular dementia with depressed mood 06/29/2015  . 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 Paroxysmal atrial fibrillation ([place]) 03/30/2015  . Low hemoglobin 04/01/2014  . History of echocardiogram   . Sick sinus syndrome ([place])   . Carotid artery disease ([place])   . Monoclonal gammopathy of undetermined significance   . HLD (hyperlipidemia)   . Hypertensive cardiovascular disease   . Back pain 11/13/2013  . Angina at rest [facility]) 11/13/2013  . Symptomatic sinus bradycardia 11/13/2013  . Aortic  valve sclerosis 07/10/2013  . Chest pain 10/18/2012  . Sinus bradycardia 10/18/2012  . Dizzy spells 10/18/2012  . PVC's (premature ventricular contractions) 09/25/2012  . Anginal pain ([place])   . Coronary artery disease, hx of stents to RCA   . Nosebleed   . 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 History of hypercholesterolemia   . Left bundle-branch block   . Tobacco abuse     [facility] 02/01/2016, 4:55 PM  [facility] [address] [place], [place], [address] Phone: [phone]   Fax:  [phone]  Name: Katherine [name] MRN: [id number] Date of Birth: 05/12/1945

## 2016-02-01 NOTE — Patient Instructions (Addendum)
Practice writing the following:  1. Donia Pounds   2.  Thank you   3.  Hope to see you soon.   [place]   6.  Practice these words:  Be, and, is, in, the, he

## 2016-02-08 ENCOUNTER — Ambulatory Visit: Payer: Medicare Other | Admitting: Occupational Therapy

## 2016-02-08 DIAGNOSIS — R41840 Attention and concentration deficit: Secondary | ICD-10-CM

## 2016-02-08 DIAGNOSIS — R41841 Cognitive communication deficit: Secondary | ICD-10-CM

## 2016-02-08 DIAGNOSIS — R482 Apraxia: Secondary | ICD-10-CM

## 2016-02-08 DIAGNOSIS — R278 Other lack of coordination: Secondary | ICD-10-CM

## 2016-02-08 NOTE — Therapy (Signed)
[facility] [address] [place], [place], [address] Phone: [phone]   Fax:  [phone]  Occupational Therapy Treatment  Patient Details  Name: NUR GOSSMAN MRN: [id number] Date of Birth: [birth year] Referring Provider: Dr. Asencion Partridge Dohmeier  Encounter Date: 02/08/2016      OT End of Session - 02/08/16 1509    Visit Number 5   Number of Visits 13   Date for OT Re-Evaluation 03/05/16   Authorization Type U[employer] Medicare, G-code needed, no visit limit/auth   Authorization - Visit Number 5   Authorization - Number of Visits 10   OT Start Time 1452   OT Stop Time 1537   OT Time Calculation (min) 45 min   Activity Tolerance Patient tolerated treatment well   Behavior During Therapy Restless;Anxious  perseverated with speech, easily frustrated, tearful      Past Medical History:  Diagnosis Date  . Carotid artery disease ([place])    a. Hx r carotid bruit - dopplers 2009 <50% bilat;  b. 10/2012 U/S: no hemodynamically signif stenosis.  . Coronary artery disease    a. s/p overlapping DES to RCA 04/2010 (days after neg stress test). b. Cath 06/2010 - patent stents, nonobst dz otherwise; c. 10/2012 Cath: LM nl, LAD 39m, D1/2/3 nl, LCX 20p, OM2 50p, RCA 20% ISR mid, EF 55-60% ->Med Rx.    [name] History of echocardiogram    a. 2D ECHO: 11/14/2013: EF 60-65%, mild concentric LVH, no RWMAs. G1DD. Mod LA dilation, mild RA dilation, PA pressure 31.  [name] HLD (hyperlipidemia)   . Hypertension   . Left bundle-branch block   . Monoclonal gammopathy of undetermined significance    a. Previously seen by heme - this is per chart but pt does not recall.  . Paroxysmal atrial fibrillation ([place]) 03/30/2015   observed on pacemaker interrogation, longest episode 2 minutes  . PVC's (premature ventricular contractions)   . Sick sinus syndrome ([place])    a. s/p PPM on 11/14/13 with Medtronic Adapta L model ADDRL 1 (serial number [id number]) pacemaker  .  Tobacco abuse     Past Surgical History:  Procedure Laterality Date  . CARDIAC CATHETERIZATION  05/14/10   SHOWED EVIDENCE OF REVERSIBLE VASOSPASM  . INGUINAL HERNIA REPAIR    . LEFT HEART CATHETERIZATION WITH CORONARY ANGIOGRAM N/A 10/17/2012   Procedure: LEFT HEART CATHETERIZATION WITH CORONARY ANGIOGRAM;  Surgeon: Wellington Hampshire, MD;  Location: [facility] CATH LAB;  Service: Cardiovascular;  Laterality: N/A;  . PACEMAKER INSERTION  11/14/13   MDT Adapta L implanted by Dr Rayann Heman for Sick sinus syndrome  . PCI     HIGH GRADE LESION IN THE MIDPORTION OF THE RIGHT CORONARY ARTERY WHICH WAS A LARGE ARTERY  . PERMANENT PACEMAKER INSERTION N/A 11/14/2013   Procedure: PERMANENT PACEMAKER INSERTION;  Surgeon: Coralyn Mark, MD;  Location: [facility] CATH LAB;  Service: Cardiovascular;  Laterality: N/A;  . TONSILLECTOMY      There were no vitals filed for this visit.      Subjective Assessment - 02/08/16 1506    Subjective  "these are just so tiny"  (buttons, pegs)   Pertinent History Alzheimer's disease, pacemaker, cardiac stents   Limitations Pacemaker, **per epic notes husband does not want Alheimer's diagnosed discussed in front of pt**   Patient Stated Goals improve writing, being able to don belt   Currently in Pain? No/denies          Tracing shapes for pen control with highlighter with  very min decr accuracy.  Attempting to color simple shapes with max difficulty/cues.  Pt able to do so eventually with cues (including hand over hand) and repetition  Writing name with good legibility today with repetition but initially max difficulty/cues due to cognitive deficits and frustration/tearful.     Attempted to write simple phrase "thank you" with max prompts/cues.  Pt able to write with max prompts (spelling verbally 1 letter at a time and copying) x2 but then became tearful and unable to continue.    Pt overall needed cueing for initiation and to continue repetitive activities thoughout session  as well as reassurance and words of affirmation.                OT Treatments/Exercises (OP) - 02/08/16 0001      Fine Motor Coordination   Fine Motor Coordination Purdue Pegboard;Grooved pegs   Grooved pegs max difficulty/unable with R hand    Purdue Pegboard with mod difficulty and cues for cognition/directions and to min-mod cues to use R hand                 OT Education - 02/08/16 1706    Education Details Homework (coloring simple shapes); continue to practice writing name/simple phrases; Avoid frustration (if happens at home, stop and try again later)   Person(s) Educated Patient;Spouse   Methods Explanation;Handout   Comprehension Verbalized understanding          OT Short Term Goals - 02/01/16 1631      OT SHORT TERM GOAL #1   Title Pt will perform HEP with min-mod cueing.--check STGs 02/17/16   Status New     OT SHORT TERM GOAL #2   Title Pt will be able to write name with 100% legibility in 3/5 trials.   Status Achieved  02/01/16  good legibility 5/5 trials            OT Long Term Goals - 02/01/16 1637      OT LONG TERM GOAL #1   Title Pt will be able to write simple phrase with 75% legibility.--check LTGs 03/05/16   Status New     OT LONG TERM GOAL #2   Title Pt will be able to complete buttons at least 90% of the time without assist.   Status New     OT LONG TERM GOAL #3   Title Pt will be able to don/fasten belt mod I at least 90% of the time.   Status New               Plan - 02/08/16 1707    Clinical Impression Statement Pt very frustrated today with novel and previously attempted tasks.  Pt tearful and appears anxious that affects performance.   Rehab Potential Fair   Clinical Impairments Affecting Rehab Potential severity of cognitive deficits (Alzheimer's disease)   OT Frequency 2x / week   OT Duration 6 weeks  +eval or 12 visits   OT Treatment/Interventions Self-care/ADL training;Therapeutic exercise;DME and/or  AE instruction;Therapeutic activities;Patient/family education;Cognitive remediation/compensation;Neuromuscular education;Visual/perceptual remediation/compensation   Plan check on buttoning/belt with spouse; continue with writing and coordination (coins, checkers, clothespins)   Consulted and Agree with Plan of Care Patient;Family member/caregiver   Family Member Consulted husband      Patient will benefit from skilled therapeutic intervention in order to improve the following deficits and impairments:  Decreased coordination, Impaired UE functional use, Impaired perceived functional ability, Decreased cognition, Impaired sensation, Impaired vision/preception (apraxia)  Visit Diagnosis: Other lack of  coordination  Attention and concentration deficit  Cognitive communication deficit  Apraxia    Problem List Patient Active Problem List   Diagnosis Date Noted  . Chronic atrial fibrillation ([place]) 06/29/2015  . Vascular dementia with depressed mood 06/29/2015  . 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 Paroxysmal atrial fibrillation ([place]) 03/30/2015  . Low hemoglobin 04/01/2014  . History of echocardiogram   . Sick sinus syndrome ([place])   . Carotid artery disease ([place])   . Monoclonal gammopathy of undetermined significance   . HLD (hyperlipidemia)   . Hypertensive cardiovascular disease   . Back pain 11/13/2013  . Angina at rest [facility]) 11/13/2013  . Symptomatic sinus bradycardia 11/13/2013  . Aortic valve sclerosis 07/10/2013  . Chest pain 10/18/2012  . Sinus bradycardia 10/18/2012  . Dizzy spells 10/18/2012  . PVC's (premature ventricular contractions) 09/25/2012  . Anginal pain ([place])   . Coronary artery disease, hx of stents to RCA   . Nosebleed   . 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 History of hypercholesterolemia   . Left bundle-branch block   . Tobacco abuse     [facility] 02/08/2016, 5:13 PM  [facility] [address] [place], [place], [address] Phone: [phone]   Fax:  [phone]  Name: SOMARA HERMSEN MRN: [id number] Date of Birth: 03/27/1946   Vianne Bulls, OTR/L [facility] [address] [place], [place]  [address] [phone] phone [phone] 02/08/16 5:13 PM

## 2016-02-09 ENCOUNTER — Ambulatory Visit: Payer: Medicare Other | Admitting: Occupational Therapy

## 2016-02-09 DIAGNOSIS — R41840 Attention and concentration deficit: Secondary | ICD-10-CM

## 2016-02-09 DIAGNOSIS — R482 Apraxia: Secondary | ICD-10-CM

## 2016-02-09 DIAGNOSIS — R278 Other lack of coordination: Secondary | ICD-10-CM

## 2016-02-09 DIAGNOSIS — R41841 Cognitive communication deficit: Secondary | ICD-10-CM

## 2016-02-09 NOTE — Therapy (Signed)
[facility] [address] [place], [place], [address] Phone: [phone]   Fax:  [phone]  Occupational Therapy Treatment  Patient Details  Name: Katherine [name] MRN: [id number] Date of Birth: 06/03/1945 Referring Provider: Dr. Asencion Partridge Dohmeier  Encounter Date: 02/09/2016      OT End of Session - 02/09/16 1552    Visit Number 6   Number of Visits 13   Date for OT Re-Evaluation 03/05/16   Authorization Type UHC Medicare, G-code needed, no visit limit/auth   Authorization - Visit Number 6   Authorization - Number of Visits 10   OT Start Time 1534   OT Stop Time 1619   OT Time Calculation (min) 45 min   Activity Tolerance Patient tolerated treatment well   Behavior During Therapy Restless;Anxious  easily frustrated      Past Medical History:  Diagnosis Date  . Carotid artery disease ([place])    a. Hx r carotid bruit - dopplers 2009 <50% bilat;  b. 10/2012 U/S: no hemodynamically signif stenosis.  . Coronary artery disease    a. s/p overlapping DES to RCA 04/2010 (days after neg stress test). b. Cath 06/2010 - patent stents, nonobst dz otherwise; c. 10/2012 Cath: LM nl, LAD 18m, D1/2/3 nl, LCX 20p, OM2 50p, RCA 20% ISR mid, EF 55-60% ->Med Rx.    [name] History of echocardiogram    a. 2D ECHO: 11/14/2013: EF 60-65%, mild concentric LVH, no RWMAs. G1DD. Mod LA dilation, mild RA dilation, PA pressure 31.  [name] HLD (hyperlipidemia)   . Hypertension   . Left bundle-branch block   . Monoclonal gammopathy of undetermined significance    a. Previously seen by heme - this is per chart but pt does not recall.  . Paroxysmal atrial fibrillation ([place]) 03/30/2015   observed on pacemaker interrogation, longest episode 2 minutes  . PVC's (premature ventricular contractions)   . Sick sinus syndrome ([place])    a. s/p PPM on 11/14/13 with Medtronic Adapta L model ADDRL 1 (serial number [id number]) pacemaker  . Tobacco abuse     Past Surgical  History:  Procedure Laterality Date  . CARDIAC CATHETERIZATION  05/14/10   SHOWED EVIDENCE OF REVERSIBLE VASOSPASM  . INGUINAL HERNIA REPAIR    . LEFT HEART CATHETERIZATION WITH CORONARY ANGIOGRAM N/A 10/17/2012   Procedure: LEFT HEART CATHETERIZATION WITH CORONARY ANGIOGRAM;  Surgeon: Wellington Hampshire, MD;  Location: [facility] CATH LAB;  Service: Cardiovascular;  Laterality: N/A;  . PACEMAKER INSERTION  11/14/13   MDT Adapta L implanted by Dr Rayann Heman for Sick sinus syndrome  . PCI     HIGH GRADE LESION IN THE MIDPORTION OF THE RIGHT CORONARY ARTERY WHICH WAS A LARGE ARTERY  . PERMANENT PACEMAKER INSERTION N/A 11/14/2013   Procedure: PERMANENT PACEMAKER INSERTION;  Surgeon: Coralyn Mark, MD;  Location: [facility] CATH LAB;  Service: Cardiovascular;  Laterality: N/A;  . TONSILLECTOMY      There were no vitals filed for this visit.      Subjective Assessment - 02/09/16 1549    Subjective  "I can't" (write name)  "I can walk around and change the channel on the tv, but I can't do things like this"   Pertinent History Alzheimer's disease, pacemaker, cardiac stents   Limitations Pacemaker, **per epic notes husband does not want Alheimer's diagnosed discussed in front of pt**   Patient Stated Goals improve writing, being able to don belt   Currently in Pain? No/denies  Placing clothespins on vertical pole for incr coordination with mod difficulty/cues for manipulation/orientation due to cognition/?apraxia.  Placing coins in coin bank with min difficulty/cues with R hand for incr coordination.  Stacking checkers with mod cueing for grading movement and directions (pt with difficulty with initiation and continuing with directions).  Attempting tracing cursive letters with max difficulty.  Attempting to write/copy given letters with max difficulty/cueing (visual, verbal, and tactile).    Practiced writing name with min-mod prompts for encouragement.  Practiced writing simple phases with max  difficulty/cues.  Discussed progress and things to continue at home (tracing cursive letters, writing name and simple phrases--pt given handouts for this, and continue with HEP for coordination); recommended ways to work on writing in context (signing old blank check, writing 1-2 items on grocery list with help); and recommended putting belt in pants loops prior to donning pants.  Pt/husband verbalized understanding.                         OT Education - 02/08/16 1706    Education Details Homework (coloring simple shapes); continue to practice writing name/simple phrases; Avoid frustration (if happens at home, stop and try again later)   Person(s) Educated Patient;Spouse   Methods Explanation;Handout   Comprehension Verbalized understanding          OT Short Term Goals - 02/09/16 1603      OT SHORT TERM GOAL #1   Title Pt will perform HEP with min-mod cueing.--check STGs 02/17/16   Status On-going  02/09/16  min cueing for some tasks, mod-max cueing for others     OT SHORT TERM GOAL #2   Title Pt will be able to write name with 100% legibility in 3/5 trials.   Status Achieved  02/01/16  good legibility 5/5 trials, but some days 3/5           OT Long Term Goals - 02/09/16 1708      OT LONG TERM GOAL #1   Title Pt will be able to write simple phrase with 75% legibility.--check LTGs 03/05/16   Status On-going     OT LONG TERM GOAL #2   Title Pt will be able to complete buttons at least 90% of the time without assist.   Status Achieved  per pt/husband report     OT LONG TERM GOAL #3   Title Pt will be able to don/fasten belt mod I at least 90% of the time.   Status On-going               Plan - 02/09/16 1553    Clinical Impression Statement Pt continues to frustrate easily and has significant difficulty following directions.  Pt also demo difficulty significant with initiation of tasks.   Rehab Potential Fair   Clinical Impairments Affecting  Rehab Potential severity of cognitive deficits (Alzheimer's disease)   OT Frequency 2x / week   OT Duration 6 weeks  +eval or 12 visits   OT Treatment/Interventions Self-care/ADL training;Therapeutic exercise;DME and/or AE instruction;Therapeutic activities;Patient/family education;Cognitive remediation/compensation;Neuromuscular education;Visual/perceptual remediation/compensation   Plan continue to address RUE functional use/coordination and writing as able; check remaing goals and anticipate d/c next week   Consulted and Agree with Plan of Care Patient;Family member/caregiver   Family Member Consulted husband      Patient will benefit from skilled therapeutic intervention in order to improve the following deficits and impairments:  Decreased coordination, Impaired UE functional use, Impaired perceived functional ability, Decreased cognition, Impaired  sensation, Impaired vision/preception (apraxia)  Visit Diagnosis: Other lack of coordination  Cognitive communication deficit  Apraxia  Attention and concentration deficit    Problem List Patient Active Problem List   Diagnosis Date Noted  . Chronic atrial fibrillation ([place]) 06/29/2015  . Vascular dementia with depressed mood 06/29/2015  . 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 Paroxysmal atrial fibrillation ([place]) 03/30/2015  . Low hemoglobin 04/01/2014  . History of echocardiogram   . Sick sinus syndrome ([place])   . Carotid artery disease ([place])   . Monoclonal gammopathy of undetermined significance   . HLD (hyperlipidemia)   . Hypertensive cardiovascular disease   . Back pain 11/13/2013  . Angina at rest [facility]) 11/13/2013  . Symptomatic sinus bradycardia 11/13/2013  . Aortic valve sclerosis 07/10/2013  . Chest pain 10/18/2012  . Sinus bradycardia 10/18/2012  . Dizzy spells 10/18/2012  . PVC's (premature ventricular contractions) 09/25/2012  . Anginal pain ([place])   . Coronary artery disease, hx of stents  to RCA   . Nosebleed   . 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 History of hypercholesterolemia   . Left bundle-branch block   . Tobacco abuse     [facility] 02/09/2016, 5:09 PM  [facility] [address] [place], [place], [address] Phone: [phone]   Fax:  [phone]  Name: SHAKTI KETCHAM MRN: [id number] Date of Birth: [birth year]   Vianne Bulls, OTR/L [facility] [address] [place], [place]  [address] [phone] phone [phone] 02/09/16 5:09 PM

## 2016-02-17 ENCOUNTER — Ambulatory Visit: Payer: Medicare Other | Attending: Neurology | Admitting: Occupational Therapy

## 2016-02-17 ENCOUNTER — Encounter: Payer: Self-pay | Admitting: Occupational Therapy

## 2016-02-17 DIAGNOSIS — R41841 Cognitive communication deficit: Secondary | ICD-10-CM | POA: Insufficient documentation

## 2016-02-17 DIAGNOSIS — R482 Apraxia: Secondary | ICD-10-CM | POA: Insufficient documentation

## 2016-02-17 DIAGNOSIS — R41840 Attention and concentration deficit: Secondary | ICD-10-CM | POA: Insufficient documentation

## 2016-02-17 DIAGNOSIS — R278 Other lack of coordination: Secondary | ICD-10-CM | POA: Insufficient documentation

## 2016-02-17 NOTE — Therapy (Signed)
[facility] [address] [place], [place], [address] Phone: [phone]   Fax:  [phone]  Patient Details  Name: Katherine [name] MRN: [id number] Date of Birth: 12/28/1945 Referring Provider:  No ref. provider found  Encounter Date: 02/17/2016  OCCUPATIONAL THERAPY DISCHARGE SUMMARY  Visits from Start of Care: 6  Current functional level related to goals / functional outcomes:  Pt's husband came in today alone for session and reports that pt did not want to return to occupational therapy as she is frustrated that her writing is not improving.  Husband reports that pt is still not aware of Alzheimers diagnosis and that he continues to choose not to tell her as it would really upset her since she had a good friend die of Alzheimers not too long ago.  Husband also reports that pt has been more upset in general about things that she can no longer do (driving, cooking, writing, etc.)  Discussed progress and continued recommendations IF pt wants to continue to work on simple writing.  Recommended husband ask pt. If she wants to work on writing and only practice if pt desires to and not to point of frustration/tears.  Recommended copying pt's own handwriting and having her trace it or if she has trouble to spell each letter of word as pt has had incr success with this, also recommended practicing name and simple repetitive phrases.  Recommended encouraging pt to be as active as possible and use RUE as much as possible for safe activities.  Recommended and provided husband with caregiver resources (support group, Just[employer]igator, caregiver classes) and discussed that if pt is having incr difficulty in the future with ADLs, her physician can refer her to OT again or home health OT (where pt can perform in familiar environment and OT can make environmental modifications prn).  Husband verbalized understanding and agreed with discharge of OT services  at this time (as today would have been pt's last scheduled appt.).          G-Codes - 02/17/16 1627    Functional Assessment Tool Used max cueing for simple words/phrases, name with 100% legibility in 3/5 trials   Functional Limitation Self care   Self Care Goal Status (G8988) At least 60 percent but less than 80 percent impaired, limited or restricted   Self Care Discharge Status (G8989) At least 80 percent but less than 100 percent impaired, limited or restricted            OT Short Term Goals - 02/09/16 1603      OT SHORT TERM GOAL #1   Title Pt will perform HEP with min-mod cueing.--check STGs 02/17/16   Status Partially met. 02/09/16  min cueing for some tasks, mod-max cueing for others     OT SHORT TERM GOAL #2   Title Pt will be able to write name with 100% legibility in 3/5 trials.   Status Achieved  02/01/16  good legibility 5/5 trials, but some days 3/5           OT Long Term Goals - 02/09/16 1708      OT LONG TERM GOAL #1   Title Pt will be able to write simple phrase with 75% legibility.--check LTGs 03/05/16   Status Not met.  Inconsistently able to write with max A.     OT LONG TERM GOAL #2   Title Pt will be able to complete buttons at least 90% of the time without assist.   Status   Achieved  per pt/husband report     OT LONG TERM GOAL #3   Title Pt will be able to don/fasten belt mod I at least 90% of the time.   Status Not met.  Husband assisting.  Recommended pt put belt in loops prior to donning.          Remaining deficits: Alzheimer's disease with cognitive deficits, apraxia, and decr coordination for ADLs, particularly with dominant RUE    Education / Equipment: Pt/husband instructed in HEP, recommendations/strategies for writing/ADLs, caregiver resources (support group, classes, just1Navigator).  Pt/husband verbalized understanding  Plan: Patient agrees to discharge.  Patient goals were partially met. Patient is being discharged due to  the patient's request. and reaching maximal rehab potential at this time. ?????        [facility] 02/17/2016, 4:31 PM  [facility] [address] [place], [place], [address] Phone: [phone]   Fax:  [place], OTR/L [facility] [address] [place], [place]  [address] [phone] phone [phone] 02/17/16 4:31 PM

## 2016-02-17 NOTE — Progress Notes (Signed)
I agree with the assessment and plan as directed by OT Vianne Bulls,    Shavonte Zhao, MD

## 2016-03-23 NOTE — Progress Notes (Signed)
Electrophysiology Office Note Date: 03/25/2016  ID:  Katherine, [name] [birth year], MRN [id number]  PCP: Thressa Sheller, MD Primary Cardiologist: Angelena Form Electrophysiologist: Allred  CC: Pacemaker follow-up  Katherine [name] is a 70 y.o. female seen today for Dr Rayann Heman.  She presents today for routine electrophysiology followup.  Since last being seen in our clinic, the patient reports doing very well.  She denies chest pain, palpitations, dyspnea, PND, orthopnea, nausea, vomiting, dizziness, syncope, edema, weight gain, or early satiety.  Device History: MDT dual chamber PPM implanted 2015 for sick sinus syndrome    Past Medical History:  Diagnosis Date  . Carotid artery disease ([place])    a. Hx r carotid bruit - dopplers 2009 <50% bilat;  b. 10/2012 U/S: no hemodynamically signif stenosis.  . Coronary artery disease    a. s/p overlapping DES to RCA 04/2010 (days after neg stress test). b. Cath 06/2010 - patent stents, nonobst dz otherwise; c. 10/2012 Cath: LM nl, LAD 33m, D1/2/3 nl, LCX 20p, OM2 50p, RCA 20% ISR mid, EF 55-60% ->Med Rx.    [name] History of echocardiogram    a. 2D ECHO: 11/14/2013: EF 60-65%, mild concentric LVH, no RWMAs. G1DD. Mod LA dilation, mild RA dilation, PA pressure 31.  [name] HLD (hyperlipidemia)   . Hypertension   . Left bundle-branch block   . Monoclonal gammopathy of undetermined significance    a. Previously seen by heme - this is per chart but pt does not recall.  . Paroxysmal atrial fibrillation ([place]) 03/30/2015   observed on pacemaker interrogation, longest episode 2 minutes  . PVC's (premature ventricular contractions)   . Sick sinus syndrome ([place])    a. s/p PPM on 11/14/13 with Medtronic Adapta L model ADDRL 1 (serial number [id number]) pacemaker  . Tobacco abuse    Past Surgical History:  Procedure Laterality Date  . CARDIAC CATHETERIZATION  05/14/10   SHOWED EVIDENCE OF REVERSIBLE VASOSPASM  . INGUINAL HERNIA REPAIR    . LEFT HEART CATHETERIZATION  WITH CORONARY ANGIOGRAM N/A 10/17/2012   Procedure: LEFT HEART CATHETERIZATION WITH CORONARY ANGIOGRAM;  Surgeon: Wellington Hampshire, MD;  Location: [facility] CATH [facility];  Service: Cardiovascular;  Laterality: N/A;  . PACEMAKER INSERTION  11/14/13   MDT Adapta L implanted by Dr Rayann Heman for Sick sinus syndrome  . PCI     HIGH GRADE LESION IN THE MIDPORTION OF THE RIGHT CORONARY ARTERY WHICH WAS A LARGE ARTERY  . PERMANENT PACEMAKER INSERTION N/A 11/14/2013   Procedure: PERMANENT PACEMAKER INSERTION;  Surgeon: Coralyn Mark, MD;  Location: [facility] CATH [facility];  Service: Cardiovascular;  Laterality: N/A;  . TONSILLECTOMY      Current Outpatient Prescriptions  Medication Sig Dispense Refill  . Ascorbic Acid (VITAMIN C) 500 MG tablet Take 250 mg by mouth daily.     [name] aspirin 325 MG tablet Take 325 mg by mouth daily.    [name] donepezil (ARICEPT) 10 MG tablet Take 1 tablet (10 mg total) by mouth at bedtime. 90 tablet 3  . ibuprofen (ADVIL,MOTRIN) 600 MG tablet Take 600 mg by mouth every 6 (six) hours as needed (pain).    [name] levothyroxine (SYNTHROID, LEVOTHROID) 25 MCG tablet TK 1 T PO QD  1  . losartan (COZAAR) 25 MG tablet Take 1 tablet (25 mg total) by mouth daily. 90 tablet 1  . memantine (NAMENDA) 10 MG tablet Take 1 tablet (10 mg total) by mouth 2 (two) times daily. 60 tablet 11  . nitroGLYCERIN (NITROSTAT) 0.4 MG SL  tablet Place 0.4 mg under the tongue every 5 (five) minutes as needed for chest pain (MAX 3 TABLETS).     . pravastatin (PRAVACHOL) 80 MG tablet      No current facility-administered medications for this visit.     Allergies:   Isosorbide nitrate; Metoprolol succinate [metoprolol]; Zocor [simvastatin]; Ezetimibe; Plavix [clopidogrel bisulfate]; Sulfa antibiotics; and Sulfonamide derivatives   Social History: Social History   Social History  . Marital status: Married    Spouse name: N/A  . Number of children: 2  . Years of education: N/A   Occupational History  . Glass blower/designer for a  lawyer-retired    Social History Main Topics  . Smoking status: Former Smoker    Quit date: 01/10/1974  . Smokeless tobacco: Not on file     Comment: Smoking for over 45 years  . Alcohol use No  . Drug use: No  . Sexual activity: Not on file   Other Topics Concern  . Not on file   Social History Narrative   Lives at home w/ her husband   Right-handed    Family History: Family History  Problem Relation Age of Onset  . Cancer Father   . Heart disease Father      Review of Systems: All other systems reviewed and are otherwise negative except as noted above.   Physical Exam: VS:  BP (!) 144/90   Pulse 73   Ht 5\' 5"  (1.651 m)   Wt 134 lb (60.8 kg)   BMI 22.30 kg/m  , BMI Body mass index is 22.3 kg/m.  GEN- The patient is well appearing, alert and appropriate  HEENT: normocephalic, atraumatic; sclera clear, conjunctiva pink; hearing intact; oropharynx clear; neck supple  Lungs- Clear to ausculation bilaterally, normal work of breathing.  No wheezes, rales, rhonchi Heart- Regular rate and rhythm  GI- soft, non-tender, non-distended, bowel sounds present Extremities- no clubbing, cyanosis, or edema; DP/PT/radial pulses 2+ bilaterally MS- no significant deformity or atrophy Skin- warm and dry, no rash or lesion; PPM pocket well healed Psych- euthymic mood, full affect Neuro- strength and sensation are intact  PPM Interrogation- reviewed in detail today,  See PACEART report  EKG:  EKG is not ordered today.  Recent Labs: 06/29/2015: ALT 7; BUN 15; Creatinine, Ser 0.81; Potassium 5.0; Sodium 142   Wt Readings from Last 3 Encounters:  03/24/16 134 lb (60.8 kg)  01/11/16 135 lb 12.8 oz (61.6 kg)  11/02/15 137 lb 6.4 oz (62.3 kg)     Other studies Reviewed: Additional studies/ records that were reviewed today include: Dr Angelena Form and Dr Jackalyn Lombard office notes  Assessment and Plan:  1.  Symptomatic bradycardia Normal PPM function See Pace Art report No changes  today  2.  HTN Stable No change required today  3.  CAD No ischemic symptoms Continue medical therapy  4.  Paroxysmal atrial fibrillation No significant recurrence by device interrogation today Will follow We discussed again that if she has increased burden, [place] would be recommendation   Current medicines are reviewed at length with the patient today.   The patient does not have concerns regarding her medicines.  The following changes were made today:  none  Labs/ tests ordered today include:  Orders Placed This Encounter  Procedures  . Implantable device check     Disposition:   Follow up with [facility], Dr Rayann Heman 1 year    Signed, Chanetta Marshall, NP 03/25/2016 12:23 PM  [facility] [address]  [place] [address] [phone] (office) [phone] (fax

## 2016-03-24 ENCOUNTER — Ambulatory Visit (INDEPENDENT_AMBULATORY_CARE_PROVIDER_SITE_OTHER): Payer: Medicare Other | Admitting: Nurse Practitioner

## 2016-03-24 VITALS — BP 144/90 | HR 73 | Ht 65.0 in | Wt 134.0 lb

## 2016-03-24 DIAGNOSIS — I10 Essential (primary) hypertension: Secondary | ICD-10-CM

## 2016-03-24 DIAGNOSIS — I480 Paroxysmal atrial fibrillation: Secondary | ICD-10-CM | POA: Diagnosis not present

## 2016-03-24 DIAGNOSIS — I495 Sick sinus syndrome: Secondary | ICD-10-CM

## 2016-03-24 NOTE — Patient Instructions (Addendum)
Medication Instructions:   Your physician recommends that you continue on your current medications as directed. Please refer to the Current Medication list given to you today.   If you need a refill on your cardiac medications before your next appointment, please call your pharmacy.  Labwork: NONE ORDERED  TODAY    Testing/Procedures: NONE ORDERED  TODAY    Follow-Up: Your physician wants you to follow-up in: [place] will receive a reminder letter in the mail two months in advance. If you don't receive a letter, please call our office to schedule the follow-up appointment.  Remote monitoring is used to monitor your Pacemaker of ICD from home. This monitoring reduces the number of office visits required to check your device to one time per year. It allows [place] to keep an eye on the functioning of your device to ensure it is working properly. You are scheduled for a device check from home on . 06/23/16..You may send your transmission at any time that day. If you have a wireless device, the transmission will be sent automatically. After your physician reviews your transmission, you will receive a postcard with your next transmission date.     Any Other Special Instructions Will Be Listed Below (If Applicable).

## 2016-03-25 LAB — CUP PACEART INCLINIC DEVICE CHECK
Date Time Interrogation Session: 20171208095646
Implantable Lead Implant Date: 20150730
Implantable Lead Location: 753860
Implantable Lead Model: 5076
MDC IDC LEAD IMPLANT DT: 20150730
MDC IDC LEAD LOCATION: 753859
MDC IDC PG IMPLANT DT: 20150730

## 2016-05-04 ENCOUNTER — Ambulatory Visit: Payer: Medicare Other | Admitting: Adult Health

## 2016-05-10 ENCOUNTER — Ambulatory Visit (INDEPENDENT_AMBULATORY_CARE_PROVIDER_SITE_OTHER): Payer: Medicare Other | Admitting: Adult Health

## 2016-05-10 ENCOUNTER — Encounter: Payer: Self-pay | Admitting: Adult Health

## 2016-05-10 VITALS — BP 137/82 | HR 77 | Ht 65.0 in | Wt 139.0 lb

## 2016-05-10 DIAGNOSIS — G309 Alzheimer's disease, unspecified: Secondary | ICD-10-CM | POA: Diagnosis not present

## 2016-05-10 DIAGNOSIS — F0280 Dementia in other diseases classified elsewhere without behavioral disturbance: Secondary | ICD-10-CM | POA: Diagnosis not present

## 2016-05-10 NOTE — Patient Instructions (Signed)
Memory score is stable Continue Aricept and Namenda If your symptoms worsen or you develop new symptoms please let us know.

## 2016-05-10 NOTE — Progress Notes (Signed)
PATIENT: Katherine [name] DOB: 26-Feb-1946  REASON FOR VISIT: follow up- Alzheimer's disease HISTORY FROM: patient  HISTORY OF PRESENT ILLNESS: Mrs. Mixell is a 71 year old female with a history of Alzheimer's disease. She returns today for follow-up. She is accompanied today by her husband. They report that her memory has remained stable. She lives at home with her husband. She does require some assistance with ADLs. Husband now manages all the finances and does all cooking. He does state that they tend to eat out quite frequent. Patient denies any trouble with her sleeping. Denies any changes with her mood or behavior. She no longer operates a  motor vehicle. She returns today for an evaluation.  HISTORY  11/18/15: Mr. Vanderaa is a 71 year old female with a history of Alzheimer's disease. She returns today for follow-up. At the last visit she was started on Namenda however the patient's husband was unaware that she should stay on Aricept. He states that she has been tolerating Namenda however each months she has been getting the titration pack to she is been continuously titrating the medication instead of staying on a maintenance dose. The patient lives at home with her husband. She is able to complete all ADLs independently. She does not operate a motor vehicle. She does take walks by herself without difficulty. They no longer cooks but rather go out. The husband now controls all the finances. He does report that she has some trouble doing her hair but other than that she is able to do most on her own. Denies any trouble with behavior or agitation. Sleeps well. Good appetite. Returns today for an evaluation.  HISTORY 08/03/2015 ([place]): CALLEIGH GHASSEMI is a 71 y.o. female , seen here as a referral from Dr. Noah Delaine for a dementia evaluation,    Chief complaint according to patient : " I forget what I just said or what I was told " Mr. Dejoseph reported that he noted over a year ago that his wife  sometimes repeatedly asked the same questions or seems to have forgotten parts of the conversation that just to place. She also seemed to be more anxious, she did not longer like to drive high ways but she also did not like to be driven long distance. The patient never got lost, but she gave up driving about 3 months ago by herself. She had noticed that when she usually likes to go shopping she had no sometimes shortness of breath or a feeling of chest tightness. She has a history of coronary artery disease and has a pacemaker. She has been treated with angioplasty. She has atrial fibrillation, pacemaker controlled. Renal artery stenosis. Mrs. Hempstead gave up balancing the check book, about 14 month ago. She is a retired Chemical engineer. She no longer reads a newspaper. Motor function changed, her handwriting is impaired, closing a belt is difficult. Mrs. Leffall also reports that she gets restful sleep and usually feels refreshed and restored in the morning, she does not report any nightmarish dreams. She has not acted out on dreams or had been known to have sundowning. Her husband has noted her to be more likely to be emotional when she watches TV or listens to music. She may tear up but she has never been sobbing crying loudly.   Interval history from 08/03/2015. I have the pleasure of seeing Mrs. Doublin, her husband and her daughter today in the meantime our workup for memory loss has progressed. The patient underwent a CT of  the head without contrast showed moderate cortical atrophy but had significantly progressed when compared to a previous CT dated 2010 it was normal enhancement pattern and no evidence of a stroke but she does have widespread white matter disease related to chronic microvascular changes. Today's Mini-Mental Status Examination was repeated and she scored 14 points 1. More than last month. Her laboratory results have returned normal blood glucose, normal albumin normal bilirubin and  normal alkaline phosphatase. Normal liver function, normal electrolytes. She has no biological information of family members.    REVIEW OF SYSTEMS: Out of a complete 14 system review of symptoms, the patient complains only of the following symptoms, and all other reviewed systems are negative.  Memory loss, cold intolerance  ALLERGIES: Allergies  Allergen Reactions  . Isosorbide Nitrate Swelling    SWELLING OF THE TONGUE - pt states she received this after PNA ? But has been able to tolerate SL nitroglycerin without adverse reaction.  . Metoprolol Succinate [Metoprolol] Other (See Comments)    Dizziness, diarrhea, tingling in fingers  . Zocor [Simvastatin] Other (See Comments)    Leg pain - does not tolerate high doses of statins  . Ezetimibe Other (See Comments)    unknown  . Plavix [Clopidogrel Bisulfate] Other (See Comments)    Prior P2Y12 showing 267 (poor Plavix responsiveness)  . Sulfa Antibiotics Other (See Comments)    Sulfa drugs as a child. UNKNOWN REACTION  . Sulfonamide Derivatives Other (See Comments)    Sulfa drugs as a child. UNKNOWN REACTION    HOME MEDICATIONS: Outpatient Medications Prior to Visit  Medication Sig Dispense Refill  . Ascorbic Acid (VITAMIN C) 500 MG tablet Take 250 mg by mouth daily.     [name] aspirin 325 MG tablet Take 325 mg by mouth daily.    [name] donepezil (ARICEPT) 10 MG tablet Take 1 tablet (10 mg total) by mouth at bedtime. 90 tablet 3  . ibuprofen (ADVIL,MOTRIN) 600 MG tablet Take 600 mg by mouth every 6 (six) hours as needed (pain).    [name] levothyroxine (SYNTHROID, LEVOTHROID) 25 MCG tablet TK 1 T PO QD  1  . losartan (COZAAR) 25 MG tablet Take 1 tablet (25 mg total) by mouth daily. 90 tablet 1  . memantine (NAMENDA) 10 MG tablet Take 1 tablet (10 mg total) by mouth 2 (two) times daily. 60 tablet 11  . nitroGLYCERIN (NITROSTAT) 0.4 MG SL tablet Place 0.4 mg under the tongue every 5 (five) minutes as needed for chest pain (MAX 3 TABLETS).     .  pravastatin (PRAVACHOL) 80 MG tablet      No facility-administered medications prior to visit.     PAST MEDICAL HISTORY: Past Medical History:  Diagnosis Date  . Carotid artery disease ([place])    a. Hx r carotid bruit - dopplers 2009 <50% bilat;  b. 10/2012 U/S: no hemodynamically signif stenosis.  . Coronary artery disease    a. s/p overlapping DES to RCA 04/2010 (days after neg stress test). b. Cath 06/2010 - patent stents, nonobst dz otherwise; c. 10/2012 Cath: LM nl, LAD 21m, D1/2/3 nl, LCX 20p, OM2 50p, RCA 20% ISR mid, EF 55-60% ->Med Rx.    [name] History of echocardiogram    a. 2D ECHO: 11/14/2013: EF 60-65%, mild concentric LVH, no RWMAs. G1DD. Mod LA dilation, mild RA dilation, PA pressure 31.  [name] HLD (hyperlipidemia)   . Hypertension   . Left bundle-branch block   . Monoclonal gammopathy of undetermined significance    a.  Previously seen by heme - this is per chart but pt does not recall.  . Paroxysmal atrial fibrillation ([place]) 03/30/2015   observed on pacemaker interrogation, longest episode 2 minutes  . PVC's (premature ventricular contractions)   . Sick sinus syndrome ([place])    a. s/p PPM on 11/14/13 with Medtronic Adapta L model ADDRL 1 (serial number [id number]) pacemaker  . Tobacco abuse     PAST SURGICAL HISTORY: Past Surgical History:  Procedure Laterality Date  . CARDIAC CATHETERIZATION  05/14/10   SHOWED EVIDENCE OF REVERSIBLE VASOSPASM  . INGUINAL HERNIA REPAIR    . LEFT HEART CATHETERIZATION WITH CORONARY ANGIOGRAM N/A 10/17/2012   Procedure: LEFT HEART CATHETERIZATION WITH CORONARY ANGIOGRAM;  Surgeon: Wellington Hampshire, MD;  Location: [facility] CATH LAB;  Service: Cardiovascular;  Laterality: N/A;  . PACEMAKER INSERTION  11/14/13   MDT Adapta L implanted by Dr Rayann Heman for Sick sinus syndrome  . PCI     HIGH GRADE LESION IN THE MIDPORTION OF THE RIGHT CORONARY ARTERY WHICH WAS A LARGE ARTERY  . PERMANENT PACEMAKER INSERTION N/A 11/14/2013   Procedure: PERMANENT PACEMAKER  INSERTION;  Surgeon: Coralyn Mark, MD;  Location: [facility] CATH LAB;  Service: Cardiovascular;  Laterality: N/A;  . TONSILLECTOMY      FAMILY HISTORY: Family History  Problem Relation Age of Onset  . Cancer Father   . Heart disease Father     SOCIAL HISTORY: Social History   Social History  . Marital status: Married    Spouse name: N/A  . Number of children: 2  . Years of education: N/A   Occupational History  . Glass blower/designer for a lawyer-retired    Social History Main Topics  . Smoking status: Former Smoker    Quit date: 01/10/1974  . Smokeless tobacco: Never Used     Comment: Smoking for over 45 years  . Alcohol use No  . Drug use: No  . Sexual activity: Not on file   Other Topics Concern  . Not on file   Social History Narrative   Lives at home w/ her husband   Right-handed      PHYSICAL EXAM  Vitals:   05/10/16 1417  BP: 137/82  Pulse: 77  Weight: 139 lb (63 kg)  Height: 5\' 5"  (1.651 m)   Body mass index is 23.13 kg/m.  MMSE - Mini Mental State Exam 05/10/2016 11/02/2015 08/03/2015  Orientation to time 5 0 1  Orientation to Place 2 3 3   Registration 3 3 3   Attention/ Calculation 0 1 0  Recall 0 0 0  Language- name 2 objects 2 2 2   Language- repeat 1 1 0  Language- follow 3 step command 0 2 3  Language- read & follow direction 1 1 1   Write a sentence 0 0 1  Copy design 0 0 0  Total score 14 13 14      Generalized: Well developed, in no acute distress   Neurological examination  Mentation: Alert Difficulty understanding instructions. speech and language fluent Cranial nerve II-XII: Pupils were equal round reactive to light. Extraocular movements were full, visual field were full on confrontational test. Facial sensation and strength were normal. Uvula tongue midline. Head turning and shoulder shrug  were normal and symmetric. Motor: The motor testing reveals 5 over 5 strength of all 4 extremities. Good symmetric motor tone is noted throughout.    Sensory: Sensory testing is intact to soft touch on all 4 extremities. No evidence of extinction is noted.  Coordination: Cerebellar testing reveals mild ataxia with finger-nose-finger and heel-to-shin bilaterally.  Gait and station: Gait is normal.  Reflexes: Deep tendon reflexes are symmetric and normal bilaterally.   DIAGNOSTIC DATA (LABS, IMAGING, TESTING) - I reviewed patient records, labs, notes, testing and imaging myself where available.      Component Value Date/Time   NA 142 06/29/2015 1507   K 5.0 06/29/2015 1507   CL 103 06/29/2015 1507   CO2 22 06/29/2015 1507   GLUCOSE 93 06/29/2015 1507   GLUCOSE 105 (H) 04/01/2014 1541   BUN 15 06/29/2015 1507   CREATININE 0.81 06/29/2015 1507   CALCIUM 9.9 06/29/2015 1507   PROT 7.7 06/29/2015 1507   ALBUMIN 4.5 06/29/2015 1507   AST 21 06/29/2015 1507   ALT 7 06/29/2015 1507   ALKPHOS 75 06/29/2015 1507   BILITOT 0.4 06/29/2015 1507   GFRNONAA 75 06/29/2015 1507   GFRAA 86 06/29/2015 1507    ASSESSMENT AND PLAN 71 y.o. year old female  has a past medical history of Carotid artery disease ([place]); Coronary artery disease; History of echocardiogram; HLD (hyperlipidemia); Hypertension; Left bundle-branch block; Monoclonal gammopathy of undetermined significance; Paroxysmal atrial fibrillation ([facility]) (03/30/2015); PVC's (premature ventricular contractions); Sick sinus syndrome ([place]); and Tobacco abuse. here with:  1. Alzheimer's disease  The patient's memory score has remained stable. She will continue on Aricept and Namenda. I have advised the patient and her husband that if her symptoms worsen or she develops any new symptoms that she'll let us know. Follow-up in 6 months with Dr. Mechele Claude, MSN, NP-C 05/10/2016, 2:32 PM [facility] [address] [place], [place] [address] [phone]

## 2016-05-31 ENCOUNTER — Other Ambulatory Visit: Payer: Self-pay | Admitting: Cardiovascular Disease

## 2016-06-20 ENCOUNTER — Other Ambulatory Visit: Payer: Self-pay | Admitting: Obstetrics and Gynecology

## 2016-06-20 DIAGNOSIS — Z1231 Encounter for screening mammogram for malignant neoplasm of breast: Secondary | ICD-10-CM

## 2016-06-25 ENCOUNTER — Emergency Department (HOSPITAL_COMMUNITY): Payer: Medicare Other

## 2016-06-25 ENCOUNTER — Observation Stay (HOSPITAL_COMMUNITY)
Admission: EM | Admit: 2016-06-25 | Discharge: 2016-06-26 | Disposition: A | Discharge status: Home / Self Care | Payer: Medicare Other | Attending: Internal Medicine | Admitting: Internal Medicine

## 2016-06-25 ENCOUNTER — Encounter (HOSPITAL_COMMUNITY): Payer: Self-pay | Admitting: Emergency Medicine

## 2016-06-25 DIAGNOSIS — I495 Sick sinus syndrome: Secondary | ICD-10-CM | POA: Diagnosis not present

## 2016-06-25 DIAGNOSIS — Z955 Presence of coronary angioplasty implant and graft: Secondary | ICD-10-CM | POA: Diagnosis not present

## 2016-06-25 DIAGNOSIS — I447 Left bundle-branch block, unspecified: Secondary | ICD-10-CM | POA: Diagnosis not present

## 2016-06-25 DIAGNOSIS — R079 Chest pain, unspecified: Secondary | ICD-10-CM | POA: Diagnosis present

## 2016-06-25 DIAGNOSIS — R0789 Other chest pain: Secondary | ICD-10-CM | POA: Diagnosis not present

## 2016-06-25 DIAGNOSIS — I10 Essential (primary) hypertension: Secondary | ICD-10-CM | POA: Diagnosis present

## 2016-06-25 DIAGNOSIS — Z950 Presence of cardiac pacemaker: Secondary | ICD-10-CM | POA: Insufficient documentation

## 2016-06-25 DIAGNOSIS — Z79899 Other long term (current) drug therapy: Secondary | ICD-10-CM | POA: Insufficient documentation

## 2016-06-25 DIAGNOSIS — Z7982 Long term (current) use of aspirin: Secondary | ICD-10-CM | POA: Diagnosis not present

## 2016-06-25 DIAGNOSIS — I2510 Atherosclerotic heart disease of native coronary artery without angina pectoris: Secondary | ICD-10-CM | POA: Diagnosis not present

## 2016-06-25 DIAGNOSIS — Z8639 Personal history of other endocrine, nutritional and metabolic disease: Secondary | ICD-10-CM

## 2016-06-25 DIAGNOSIS — I119 Hypertensive heart disease without heart failure: Secondary | ICD-10-CM | POA: Insufficient documentation

## 2016-06-25 DIAGNOSIS — M5489 Other dorsalgia: Secondary | ICD-10-CM | POA: Insufficient documentation

## 2016-06-25 DIAGNOSIS — Z87891 Personal history of nicotine dependence: Secondary | ICD-10-CM | POA: Insufficient documentation

## 2016-06-25 DIAGNOSIS — F0150 Vascular dementia without behavioral disturbance: Secondary | ICD-10-CM | POA: Insufficient documentation

## 2016-06-25 DIAGNOSIS — E7800 Pure hypercholesterolemia, unspecified: Secondary | ICD-10-CM | POA: Insufficient documentation

## 2016-06-25 DIAGNOSIS — E785 Hyperlipidemia, unspecified: Secondary | ICD-10-CM | POA: Insufficient documentation

## 2016-06-25 LAB — CBC
HEMATOCRIT: 37 % (ref 36.0–46.0)
HEMOGLOBIN: 11.8 g/dL — AB (ref 12.0–15.0)
MCH: 29.1 pg (ref 26.0–34.0)
MCHC: 31.9 g/dL (ref 30.0–36.0)
MCV: 91.4 fL (ref 78.0–100.0)
Platelets: 351 10*3/uL (ref 150–400)
RBC: 4.05 MIL/uL (ref 3.87–5.11)
RDW: 14.8 % (ref 11.5–15.5)
WBC: 7.5 10*3/uL (ref 4.0–10.5)

## 2016-06-25 LAB — BASIC METABOLIC PANEL
Anion gap: 9 (ref 5–15)
BUN: 21 mg/dL — ABNORMAL HIGH (ref 6–20)
CALCIUM: 9.3 mg/dL (ref 8.9–10.3)
CO2: 24 mmol/L (ref 22–32)
Chloride: 107 mmol/L (ref 101–111)
Creatinine, Ser: 1.22 mg/dL — ABNORMAL HIGH (ref 0.44–1.00)
GFR calc Af Amer: 51 mL/min — ABNORMAL LOW (ref >60)
GFR, EST NON AFRICAN AMERICAN: 44 mL/min — AB (ref >60)
Glucose, Bld: 157 mg/dL — ABNORMAL HIGH (ref 65–99)
Potassium: 4 mmol/L (ref 3.5–5.1)
SODIUM: 140 mmol/L (ref 135–145)

## 2016-06-25 LAB — I-STAT TROPONIN, ED: TROPONIN I, POC: 0 ng/mL (ref 0.00–0.08)

## 2016-06-25 MED ORDER — NITROGLYCERIN 2 % TD OINT
1.0000 [in_us] | TOPICAL_OINTMENT | Freq: Once | TRANSDERMAL | Status: AC
  Administered 2016-06-26: 1 [in_us] via TOPICAL
  Filled 2016-06-25: qty 1

## 2016-06-25 MED ORDER — ASPIRIN 81 MG PO CHEW
324.0000 mg | CHEWABLE_TABLET | Freq: Once | ORAL | Status: DC
  Filled 2016-06-25: qty 4

## 2016-06-25 NOTE — ED Provider Notes (Signed)
[facility] DEPT Provider Note   CSN: [id number] Arrival date & time: 06/25/16  2003   By signing my name below, I, Soijett Blue, attest that this documentation has been prepared under the direction and in the presence of Merryl Hacker, MD. Electronically Signed: Soijett Blue, ED Scribe. 06/25/16. 11:22 PM.  History   Chief Complaint Chief Complaint  Patient presents with  . Chest Pain    HPI Katherine [name] is a 71 y.o. female with a PMHx of coronary artery disease, carotid artery disease, HTN, who presents to the Emergency Department complaining of 1/10, mildly resolved, left sided CP onset 3 hours ago. She notes that she had just finished eating dinner prior to the onset of her left sided CP. Pt reports associated left upper back pain. Pt has not tried any medications for the relief of her symptoms. Pt cardiologist is Dr. Angelena Form and she has had several cardiac stents and a pacemaker placed. She denies nausea, abdominal pain, diarrhea, fever, cough, burning sensation, appetite change, and any other symptoms.  I have reviewed the patient's chart. Last stress test was November 2015 which was normal.    The history is provided by the patient. No language interpreter was used.    Past Medical History:  Diagnosis Date  . Carotid artery disease ([place])    a. Hx r carotid bruit - dopplers 2009 <50% bilat;  b. 10/2012 U/S: no hemodynamically signif stenosis.  . Coronary artery disease    a. s/p overlapping DES to RCA 04/2010 (days after neg stress test). b. Cath 06/2010 - patent stents, nonobst dz otherwise; c. 10/2012 Cath: LM nl, LAD 13m, D1/2/3 nl, LCX 20p, OM2 50p, RCA 20% ISR mid, EF 55-60% ->Med Rx.    [name] History of echocardiogram    a. 2D ECHO: 11/14/2013: EF 60-65%, mild concentric LVH, no RWMAs. G1DD. Mod LA dilation, mild RA dilation, PA pressure 31.  [name] HLD (hyperlipidemia)   . Hypertension   . Left bundle-branch block   . Monoclonal gammopathy of undetermined significance    a. Previously seen by heme - this is per chart but pt does not recall.  . Paroxysmal atrial fibrillation ([place]) 03/30/2015   observed on pacemaker interrogation, longest episode 2 minutes  . PVC's (premature ventricular contractions)   . Sick sinus syndrome ([place])    a. s/p PPM on 11/14/13 with Medtronic Adapta L model ADDRL 1 (serial number [id number]) pacemaker  . Tobacco abuse     Patient Active Problem List   Diagnosis Date Noted  . Chronic atrial fibrillation ([place]) 06/29/2015  . Vascular dementia with depressed mood 06/29/2015  . 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 Paroxysmal atrial fibrillation ([place]) 03/30/2015  . Low hemoglobin 04/01/2014  . History of echocardiogram   . Sick sinus syndrome ([place])   . Carotid artery disease ([place])   . Monoclonal gammopathy of undetermined significance   . HLD (hyperlipidemia)   . Hypertensive cardiovascular disease   . Back pain 11/13/2013  . Angina at rest [facility]) 11/13/2013  . Symptomatic sinus bradycardia 11/13/2013  . Aortic valve sclerosis 07/10/2013  . Chest pain 10/18/2012  . Sinus bradycardia 10/18/2012  . Dizzy spells 10/18/2012  . PVC's (premature ventricular contractions) 09/25/2012  . Anginal pain ([place])   . Coronary artery disease, hx of stents to RCA   . Nosebleed   . 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 History of hypercholesterolemia   . Left bundle-branch block   .  Tobacco abuse     Past Surgical History:  Procedure Laterality Date  . CARDIAC CATHETERIZATION  05/14/10   SHOWED EVIDENCE OF REVERSIBLE VASOSPASM  . INGUINAL HERNIA REPAIR    . LEFT HEART CATHETERIZATION WITH CORONARY ANGIOGRAM N/A 10/17/2012   Procedure: LEFT HEART CATHETERIZATION WITH CORONARY ANGIOGRAM;  Surgeon: Wellington Hampshire, MD;  Location: [facility] CATH LAB;  Service: Cardiovascular;  Laterality: N/A;  . PACEMAKER INSERTION  11/14/13   MDT Adapta L implanted by Dr Rayann Heman for Sick sinus syndrome  . PCI     HIGH  GRADE LESION IN THE MIDPORTION OF THE RIGHT CORONARY ARTERY WHICH WAS A LARGE ARTERY  . PERMANENT PACEMAKER INSERTION N/A 11/14/2013   Procedure: PERMANENT PACEMAKER INSERTION;  Surgeon: Coralyn Mark, MD;  Location: [facility] CATH LAB;  Service: Cardiovascular;  Laterality: N/A;  . TONSILLECTOMY      OB History    No data available       Home Medications    Prior to Admission medications   Medication Sig Start Date End Date Taking? Authorizing Provider  aspirin 325 MG tablet Take 325 mg by mouth daily.   Yes Historical Provider, MD  Cholecalciferol (VITAMIN D PO) Take 1 tablet by mouth daily.   Yes Historical Provider, MD  Cyanocobalamin (VITAMIN B 12 PO) Take 1 tablet by mouth daily.   Yes Historical Provider, MD  donepezil (ARICEPT) 10 MG tablet Take 1 tablet (10 mg total) by mouth at bedtime. 11/02/15  Yes Ward Givens, NP  levothyroxine (SYNTHROID, LEVOTHROID) 25 MCG tablet Take 25 mcg by mouth daily before breakfast.   Yes Historical Provider, MD  losartan (COZAAR) 25 MG tablet TAKE 1 TABLET(25 MG) BY MOUTH DAILY 06/01/16  Yes Burnell Blanks, MD  memantine (NAMENDA) 10 MG tablet Take 1 tablet (10 mg total) by mouth 2 (two) times daily. 11/02/15  Yes Ward Givens, NP  nitroGLYCERIN (NITROSTAT) 0.4 MG SL tablet Place 0.4 mg under the tongue every 5 (five) minutes as needed for chest pain (MAX 3 TABLETS).    Yes Historical Provider, MD  pravastatin (PRAVACHOL) 80 MG tablet Take 80 mg by mouth daily.  02/04/16  Yes Historical Provider, MD    Family History Family History  Problem Relation Age of Onset  . Cancer Father   . Heart disease Father     Social History Social History  Substance Use Topics  . Smoking status: Former Smoker    Quit date: 01/10/1974  . Smokeless tobacco: Never Used     Comment: Smoking for over 45 years  . Alcohol use No     Allergies   Isosorbide nitrate; Metoprolol succinate [metoprolol]; Zocor [simvastatin]; Ezetimibe; Plavix [clopidogrel  bisulfate]; Sulfa antibiotics; and Sulfonamide derivatives   Review of Systems Review of Systems  Constitutional: Negative for appetite change and fever.  Cardiovascular: Positive for chest pain (left sided).  Gastrointestinal: Negative for abdominal pain, diarrhea and nausea.  Musculoskeletal: Positive for back pain (left upper).  All other systems reviewed and are negative.    Physical Exam Updated Vital Signs BP 134/70   Pulse (!) 54   Temp 97.8 F (36.6 C) (Oral)   Resp 18   SpO2 97%   Physical Exam  Constitutional: She appears well-developed and well-nourished. No distress.  HENT:  Head: Normocephalic and atraumatic.  Eyes: Pupils are equal, round, and reactive to light.  Cardiovascular: Normal rate, regular rhythm and normal heart sounds.   Pulmonary/Chest: Effort normal. No respiratory distress. She has no wheezes.  Palpable  pacemaker left upper chest  Abdominal: Soft. Bowel sounds are normal. There is no tenderness.  Musculoskeletal:  Trace lower extremity edema  Neurological: She is alert.  At times disoriented  Skin: Skin is warm and dry.  Psychiatric: She has a normal mood and affect.  Nursing note and vitals reviewed.    ED Treatments / Results  DIAGNOSTIC STUDIES: Oxygen Saturation is 99% on RA, nl by my interpretation.    COORDINATION OF CARE: 11:22 PM Discussed treatment plan with pt at bedside which includes labs, EKG, CXR, and pt agreed to plan.   Labs (all labs ordered are listed, but only abnormal results are displayed) Labs Reviewed  BASIC METABOLIC PANEL - Abnormal; Notable for the following:       Result Value   Glucose, Bld 157 (*)    BUN 21 (*)    Creatinine, Ser 1.22 (*)    GFR calc non Af Amer 44 (*)    GFR calc Af Amer 51 (*)    All other components within normal limits  CBC - Abnormal; Notable for the following:    Hemoglobin 11.8 (*)    All other components within normal limits  BRAIN NATRIURETIC PEPTIDE - Abnormal; Notable  for the following:    B Natriuretic Peptide 332.6 (*)    All other components within normal limits  I-STAT TROPOININ, ED    EKG  EKG Interpretation  Date/Time:  Saturday June 25 2016 20:10:52 EST Ventricular Rate:  79 PR Interval:  166 QRS Duration: 130 QT Interval:  420 QTC Calculation: 481 R Axis:   -96 Text Interpretation:  Sinus rhythm with frequent Premature ventricular complexes in a pattern of bigeminy Right superior axis deviation Non-specific intra-ventricular conduction block Cannot rule out Anteroseptal infarct , age undetermined Abnormal ECG in a pattern of bigeminy Confirmed by MILLER  MD, BRIAN ([id number]) on 06/25/2016 10:29:38 PM       Radiology Dg Chest 2 View  Result Date: 06/25/2016 CLINICAL DATA:  Chest pain, onset this evening. EXAM: CHEST  2 VIEW COMPARISON:  03/05/2014 FINDINGS: Mild cardiomegaly. The transvenous cardiac leads appear grossly intact. Mild interstitial coarsening, possibly chronic. No airspace consolidation. No effusions. Unremarkable hilar and mediastinal contours. IMPRESSION: Cardiomegaly. Mild chronic appearing interstitial coarsening. No consolidation or effusion. Electronically Signed   By: Andreas Newport M.D.   On: 06/25/2016 20:03    Procedures Procedures (including critical care time)  Medications Ordered in ED Medications  aspirin chewable tablet 324 mg (324 mg Oral Not Given 06/26/16 0020)  nitroGLYCERIN (NITROGLYN) 2 % ointment 1 inch (1 inch Topical Given 06/26/16 0020)     Initial Impression / Assessment and Plan / ED Course  I have reviewed the triage vital signs and the nursing notes.  Pertinent labs & imaging results that were available during my care of the patient were reviewed by me and considered in my medical decision making (see chart for details).     Patient presents with chest pain. History of dementia. She is not best historian. She is otherwise nontoxic. Vital signs reassuring. EKG shows bigeminy. She does have  a pacemaker for sick sinus syndrome. She was given nitroglycerin placed with complete resolution of pain. She was also given full dose aspirin. On recheck she remained stable. Initial workup including troponin is reassuring. She does have evidence of mild acute kidney injury. However on exam she has trace lower extremity edema. Will admit for a formal chest pain rule out.  Final Clinical Impressions(s) / ED Diagnoses  Final diagnoses:  Other chest pain    New Prescriptions New Prescriptions   No medications on file   I personally performed the services described in this documentation, which was scribed in my presence. The recorded information has been reviewed and is accurate.     Merryl Hacker, MD 06/26/16 [phone]

## 2016-06-25 NOTE — ED Notes (Signed)
Patient's EKG erratic, ranging 70's to 40's, she has a pacemaker, she was in bigeminy at one point in triage.

## 2016-06-25 NOTE — ED Triage Notes (Signed)
Patient with cardiac history having chest pain that started earlier this evening.  She states that she ate dinner, started feeling worse after dinner.  Patient states that the pain is a pressure, she denies any shortness of breath.

## 2016-06-26 DIAGNOSIS — I10 Essential (primary) hypertension: Secondary | ICD-10-CM | POA: Diagnosis not present

## 2016-06-26 DIAGNOSIS — Z8639 Personal history of other endocrine, nutritional and metabolic disease: Secondary | ICD-10-CM | POA: Diagnosis not present

## 2016-06-26 DIAGNOSIS — R079 Chest pain, unspecified: Secondary | ICD-10-CM | POA: Diagnosis not present

## 2016-06-26 LAB — TROPONIN I: Troponin I: <0.03 ng/mL (ref <0.03)

## 2016-06-26 LAB — BRAIN NATRIURETIC PEPTIDE: B NATRIURETIC PEPTIDE 5: 332.6 pg/mL — AB (ref 0.0–100.0)

## 2016-06-26 MED ORDER — ENOXAPARIN SODIUM 40 MG/0.4ML ~~LOC~~ SOLN: 40.0000 mg | SUBCUTANEOUS | Status: DC

## 2016-06-26 MED ORDER — LEVOTHYROXINE SODIUM 25 MCG PO TABS
25.0000 ug | ORAL_TABLET | Freq: Every day | ORAL | Status: DC
  Filled 2016-06-26: qty 1

## 2016-06-26 MED ORDER — DONEPEZIL HCL 10 MG PO TABS
10.0000 mg | ORAL_TABLET | Freq: Every day | ORAL | Status: DC
  Filled 2016-06-26: qty 1

## 2016-06-26 MED ORDER — ONDANSETRON HCL 4 MG/2ML IJ SOLN: 4.0000 mg | Freq: Four times a day (QID) | INTRAMUSCULAR | Status: DC | PRN

## 2016-06-26 MED ORDER — MEMANTINE HCL 10 MG PO TABS
10.0000 mg | ORAL_TABLET | Freq: Two times a day (BID) | ORAL | Status: DC
  Filled 2016-06-26: qty 1

## 2016-06-26 MED ORDER — ASPIRIN 325 MG PO TABS: 325.0000 mg | ORAL_TABLET | Freq: Every day | ORAL | Status: DC

## 2016-06-26 MED ORDER — PRAVASTATIN SODIUM 40 MG PO TABS: 80.0000 mg | ORAL_TABLET | Freq: Every day | ORAL | Status: DC

## 2016-06-26 MED ORDER — ACETAMINOPHEN 325 MG PO TABS: 650.0000 mg | ORAL_TABLET | ORAL | Status: DC | PRN

## 2016-06-26 MED ORDER — LOSARTAN POTASSIUM 25 MG PO TABS: 25.0000 mg | ORAL_TABLET | Freq: Every day | ORAL | Status: DC

## 2016-06-26 NOTE — H&P (Signed)
History and Physical    NYJAH SCHWAKE KZS:[id number] DOB: [birth year] DOA: 06/25/2016   PCP: Thressa Sheller, MD Chief Complaint:  Chief Complaint  Patient presents with  . Chest Pain    HPI: Katherine [name] is a 71 y.o. female with medical history significant of CAD, single vessel disease s/p overlapping DES to RCA 1/12 days after a neg stress test.  Last cath in 2014.  Patient presents to the ED with c/o chest pain.  Symptoms onset 3 hours ago, resolved currently.  Onset just after eating dinner prior to left sided chest pain with radiation to upper back.  No meds taken for relief of symptoms.  No nausea.  ED Course: Trop neg.  EKG unchanged.  Review of Systems: As per HPI otherwise 10 point review of systems negative.    Past Medical History:  Diagnosis Date  . Carotid artery disease ([place])    a. Hx r carotid bruit - dopplers 2009 <50% bilat;  b. 10/2012 U/S: no hemodynamically signif stenosis.  . Coronary artery disease    a. s/p overlapping DES to RCA 04/2010 (days after neg stress test). b. Cath 06/2010 - patent stents, nonobst dz otherwise; c. 10/2012 Cath: LM nl, LAD 40m, D1/2/3 nl, LCX 20p, OM2 50p, RCA 20% ISR mid, EF 55-60% ->Med Rx.    Katherine [name] History of echocardiogram    a. 2D ECHO: 11/14/2013: EF 60-65%, mild concentric LVH, no RWMAs. G1DD. Mod LA dilation, mild RA dilation, PA pressure 31.  Katherine [name] HLD (hyperlipidemia)   . Hypertension   . Left bundle-branch block   . Monoclonal gammopathy of undetermined significance    a. Previously seen by heme - this is per chart but pt does not recall.  . Paroxysmal atrial fibrillation ([place]) 03/30/2015   observed on pacemaker interrogation, longest episode 2 minutes  . PVC's (premature ventricular contractions)   . Sick sinus syndrome ([place])    a. s/p PPM on 11/14/13 with Medtronic Adapta L model ADDRL 1 (serial number [id number]) pacemaker  . Tobacco abuse     Past Surgical History:  Procedure Laterality Date  . CARDIAC CATHETERIZATION   05/14/10   SHOWED EVIDENCE OF REVERSIBLE VASOSPASM  . INGUINAL HERNIA REPAIR    . LEFT HEART CATHETERIZATION WITH CORONARY ANGIOGRAM N/A 10/17/2012   Procedure: LEFT HEART CATHETERIZATION WITH CORONARY ANGIOGRAM;  Surgeon: Wellington Hampshire, MD;  Location: [facility] CATH LAB;  Service: Cardiovascular;  Laterality: N/A;  . PACEMAKER INSERTION  11/14/13   MDT Adapta L implanted by Dr Rayann Heman for Sick sinus syndrome  . PCI     HIGH GRADE LESION IN THE MIDPORTION OF THE RIGHT CORONARY ARTERY WHICH WAS A LARGE ARTERY  . PERMANENT PACEMAKER INSERTION N/A 11/14/2013   Procedure: PERMANENT PACEMAKER INSERTION;  Surgeon: Coralyn Mark, MD;  Location: [facility] CATH LAB;  Service: Cardiovascular;  Laterality: N/A;  . TONSILLECTOMY       reports that she quit smoking about 42 years ago. She has never used smokeless tobacco. She reports that she does not drink alcohol or use drugs.  Allergies  Allergen Reactions  . Isosorbide Nitrate Swelling    SWELLING OF THE TONGUE - pt states she received this after PNA ? But has been able to tolerate SL nitroglycerin without adverse reaction.  . Metoprolol Succinate [Metoprolol] Other (See Comments)    Dizziness, diarrhea, tingling in fingers  . Zocor [Simvastatin] Other (See Comments)    Leg pain - does not tolerate high doses of statins  . Ezetimibe  Other (See Comments)    unknown  . Plavix [Clopidogrel Bisulfate] Other (See Comments)    Prior P2Y12 showing 267 (poor Plavix responsiveness)  . Sulfa Antibiotics Other (See Comments)    Sulfa drugs as a child. UNKNOWN REACTION  . Sulfonamide Derivatives Other (See Comments)    Sulfa drugs as a child. UNKNOWN REACTION    Family History  Problem Relation Age of Onset  . Cancer Father   . Heart disease Father       Prior to Admission medications   Medication Sig Start Date End Date Taking? Authorizing Provider  aspirin 325 MG tablet Take 325 mg by mouth daily.   Yes Historical Provider, MD  Cholecalciferol (VITAMIN D  PO) Take 1 tablet by mouth daily.   Yes Historical Provider, MD  Cyanocobalamin (VITAMIN B 12 PO) Take 1 tablet by mouth daily.   Yes Historical Provider, MD  donepezil (ARICEPT) 10 MG tablet Take 1 tablet (10 mg total) by mouth at bedtime. 11/02/15  Yes Ward Givens, NP  levothyroxine (SYNTHROID, LEVOTHROID) 25 MCG tablet Take 25 mcg by mouth daily before breakfast.   Yes Historical Provider, MD  losartan (COZAAR) 25 MG tablet TAKE 1 TABLET(25 MG) BY MOUTH DAILY 06/01/16  Yes Burnell Blanks, MD  memantine (NAMENDA) 10 MG tablet Take 1 tablet (10 mg total) by mouth 2 (two) times daily. 11/02/15  Yes Ward Givens, NP  nitroGLYCERIN (NITROSTAT) 0.4 MG SL tablet Place 0.4 mg under the tongue every 5 (five) minutes as needed for chest pain (MAX 3 TABLETS).    Yes Historical Provider, MD  pravastatin (PRAVACHOL) 80 MG tablet Take 80 mg by mouth daily.  02/04/16  Yes Historical Provider, MD    Physical Exam: Vitals:   06/26/16 0015 06/26/16 0045 06/26/16 0115 06/26/16 0130  BP: 134/70 141/66 144/70 152/78  Pulse: (!) 54 (!) 50 (!) 47 64  Resp: 18 17 16 16   Temp:      TempSrc:      SpO2: 97% 97% 98% 96%      Constitutional: NAD, calm, comfortable Eyes: PERRL, lids and conjunctivae normal ENMT: Mucous membranes are moist. Posterior pharynx clear of any exudate or lesions.Normal dentition.  Neck: normal, supple, no masses, no thyromegaly Respiratory: clear to auscultation bilaterally, no wheezing, no crackles. Normal respiratory effort. No accessory muscle use.  Cardiovascular: Regular rate and rhythm, no murmurs / rubs / gallops. No extremity edema. 2+ pedal pulses. No carotid bruits.  Abdomen: no tenderness, no masses palpated. No hepatosplenomegaly. Bowel sounds positive.  Musculoskeletal: no clubbing / cyanosis. No joint deformity upper and lower extremities. Good ROM, no contractures. Normal muscle tone.  Skin: no rashes, lesions, ulcers. No induration Neurologic: CN 2-12 grossly  intact. Sensation intact, DTR normal. Strength 5/5 in all 4.  Psychiatric: Normal judgment and insight. Alert and oriented x 3. Normal mood.    Labs on Admission: I have personally reviewed following labs and imaging studies  CBC:  Recent Labs Lab 06/25/16 2014  WBC 7.5  HGB 11.8*  HCT 37.0  MCV 91.4  PLT 161   Basic Metabolic Panel:  Recent Labs Lab 06/25/16 2014  NA 140  K 4.0  CL 107  CO2 24  GLUCOSE 157*  BUN 21*  CREATININE 1.22*  CALCIUM 9.3   GFR: CrCl cannot be calculated (Unknown ideal weight.). Liver Function Tests: No results for input(s): AST, ALT, ALKPHOS, BILITOT, PROT, ALBUMIN in the last 168 hours. No results for input(s): LIPASE, AMYLASE in the last 168  hours. No results for input(s): AMMONIA in the last 168 hours. Coagulation Profile: No results for input(s): INR, PROTIME in the last 168 hours. Cardiac Enzymes: No results for input(s): CKTOTAL, CKMB, CKMBINDEX, TROPONINI in the last 168 hours. BNP (last 3 results) No results for input(s): PROBNP in the last 8760 hours. HbA1C: No results for input(s): HGBA1C in the last 72 hours. CBG: No results for input(s): GLUCAP in the last 168 hours. Lipid Profile: No results for input(s): CHOL, HDL, LDLCALC, TRIG, CHOLHDL, LDLDIRECT in the last 72 hours. Thyroid Function Tests: No results for input(s): TSH, T4TOTAL, FREET4, T3FREE, THYROIDAB in the last 72 hours. Anemia Panel: No results for input(s): VITAMINB12, FOLATE, FERRITIN, TIBC, IRON, RETICCTPCT in the last 72 hours. Urine analysis:    Component Value Date/Time   COLORURINE YELLOW 03/05/2014 1915   APPEARANCEUR CLEAR 03/05/2014 1915   LABSPEC 1.018 03/05/2014 1915   PHURINE 6.5 03/05/2014 1915   GLUCOSEU NEGATIVE 03/05/2014 1915   HGBUR NEGATIVE 03/05/2014 1915   BILIRUBINUR NEGATIVE 03/05/2014 1915   KETONESUR NEGATIVE 03/05/2014 1915   PROTEINUR NEGATIVE 03/05/2014 1915   UROBILINOGEN 0.2 03/05/2014 1915   NITRITE NEGATIVE 03/05/2014  1915   LEUKOCYTESUR NEGATIVE 03/05/2014 1915   Sepsis Labs: @LABRCNTIP (procalcitonin:4,lacticidven:4) )No results found for this or any previous visit (from the past 240 hour(s)).   Radiological Exams on Admission: Dg Chest 2 View  Result Date: 06/25/2016 CLINICAL DATA:  Chest pain, onset this evening. EXAM: CHEST  2 VIEW COMPARISON:  03/05/2014 FINDINGS: Mild cardiomegaly. The transvenous cardiac leads appear grossly intact. Mild interstitial coarsening, possibly chronic. No airspace consolidation. No effusions. Unremarkable hilar and mediastinal contours. IMPRESSION: Cardiomegaly. Mild chronic appearing interstitial coarsening. No consolidation or effusion. Electronically Signed   By: Andreas Newport M.D.   On: 06/25/2016 20:03    EKG: Independently reviewed.  Assessment/Plan Principal Problem:   Chest pain, rule out acute myocardial infarction Active Problems:   History of hypercholesterolemia   Benign essential HTN    1. CP ruleout - 1. CP obs pathway 2. Serial trops 3. Tele montior 4. NPO after midnight 5. Call cards in AM 2. HTN - continue home meds 3. HLD - continue statin   DVT prophylaxis: Lovenox Code Status: Full Family Communication: Family at bedside Consults called: None Admission status: Place in [place], [place] [facility] Pager [phone] from 7PM-7AM  If 7AM-7PM, please contact the day physician for the patient [email or web address] Password [email or web address]  06/26/2016, 1:44 AM

## 2016-06-26 NOTE — Progress Notes (Signed)
Discharged to home with family office visits in place teaching done  

## 2016-06-27 ENCOUNTER — Encounter: Payer: Self-pay | Admitting: Physician Assistant

## 2016-06-27 NOTE — Progress Notes (Signed)
I am covering Trish's inbasket today. Forwarded request for quick f/u appt from IM to schedulers. I have sent a message to our [facility] office's scheduler requesting a follow-up appointment, and our office will call the patient with this information.  Sybil Shrader PA-C

## 2016-07-06 NOTE — Discharge Summary (Signed)
Physician Discharge Summary  Katherine [name]:[id number] DOB: [birth year] DOA: 06/25/2016  PCP: Thressa Sheller, MD  Admit date: 06/25/2016 Discharge date: 06/26/2016  Time spent: 35 minutes  Recommendations for Outpatient Follow-up:  1. Dr.McAlhaney or Cards PA in 2-3weeks   Discharge Diagnoses:  Principal Problem:   Atypical Chest pain   Dementia   History of hypercholesterolemia   Benign essential HTN  Discharge Condition: stable  Diet recommendation: heart healthy  Filed Weights   06/26/16 0318  Weight: 63.6 kg (140 lb 4.8 oz)    History of present illness:  Katherine [name] is a 71 y.o. female with medical history significant of Dementia, CAD, LBBB, single vessel disease s/p overlapping DES to RCA in 2012 after a neg stress test.  Last cath in 2014.  Patient presents to the ED with c/o chest pain.  Symptoms onset 3 hours ago, resolved currently.  Onset just after eating dinner prior to left sided chest pain with radiation to upper back  Hospital Course:   1. Atypical chest/back pain -admitted last pm, chest/back pain resolved, this morning patient doesn't recall ever having had any chest pain and tells me that she had some back pain last night, no symptoms at this time -EKG showed LBBB which is unchanged from baseline -Troponins negative -since no clear symptoms at this time I called and discussed with Dr.Koneswaran who was covering for Mr.McAlhaney who agreed that under these circumstances it would be reasonable to have her FU with Dr.McAlhaney in the office in few weeks to decide if workup is warranted. -message sent to office for Follow up request  2. CAD/LBBB -single vessel disease s/p overlapping DES to RCA in 2012 after a neg stress test -Last cath in July 2014 with mild disease in the LAD and Circumflex, patent RCA stents with mild restenosis -FU with Dr.McAlhaney  Consultations:  D/w Dr.Koneswaran  Discharge Exam: Vitals:   06/26/16 0130 06/26/16 0318  BP:  152/78 (!) 142/54  Pulse: 64 77  Resp: 16 16  Temp:  97.7 F (36.5 C)    General: AAOx3 Cardiovascular: S1S2/RRR Respiratory: CTAB  Discharge Instructions   Discharge Instructions    Diet - low sodium heart healthy    Complete by:  As directed    Increase activity slowly    Complete by:  As directed      Discharge Medication List as of 06/26/2016 10:06 AM    CONTINUE these medications which have NOT CHANGED   Details  aspirin 325 MG tablet Take 325 mg by mouth daily., Historical Med    Cholecalciferol (VITAMIN D PO) Take 1 tablet by mouth daily., Historical Med    Cyanocobalamin (VITAMIN B 12 PO) Take 1 tablet by mouth daily., Historical Med    donepezil (ARICEPT) 10 MG tablet Take 1 tablet (10 mg total) by mouth at bedtime., Starting Mon 11/02/2015, Print    levothyroxine (SYNTHROID, LEVOTHROID) 25 MCG tablet Take 25 mcg by mouth daily before breakfast., Historical Med    losartan (COZAAR) 25 MG tablet TAKE 1 TABLET(25 MG) BY MOUTH DAILY, Normal    memantine (NAMENDA) 10 MG tablet Take 1 tablet (10 mg total) by mouth 2 (two) times daily., Starting Mon 11/02/2015, Normal    nitroGLYCERIN (NITROSTAT) 0.4 MG SL tablet Place 0.4 mg under the tongue every 5 (five) minutes as needed for chest pain (MAX 3 TABLETS). , Historical Med    pravastatin (PRAVACHOL) 80 MG tablet Take 80 mg by mouth daily. , Starting Thu 02/04/2016, Historical Med  Allergies  Allergen Reactions  . Isosorbide Nitrate Swelling    SWELLING OF THE TONGUE - pt states she received this after PNA ? But has been able to tolerate SL nitroglycerin without adverse reaction.  . Metoprolol Succinate [Metoprolol] Other (See Comments)    Dizziness, diarrhea, tingling in fingers  . Zocor [Simvastatin] Other (See Comments)    Leg pain - does not tolerate high doses of statins  . Ezetimibe Other (See Comments)    unknown  . Plavix [Clopidogrel Bisulfate] Other (See Comments)    Prior P2Y12 showing 267 (poor  Plavix responsiveness)  . Sulfa Antibiotics Other (See Comments)    Sulfa drugs as a child. UNKNOWN REACTION  . Sulfonamide Derivatives Other (See Comments)    Sulfa drugs as a child. UNKNOWN REACTION   Follow-up Information    Lauree Chandler, MD. Go in 1 week(s).   Specialty:  Cardiology Why:  Office will call you for a follow up Contact information: [place]. [address] [place] [address] [phone]            The results of significant diagnostics from this hospitalization (including imaging, microbiology, ancillary and laboratory) are listed below for reference.    Significant Diagnostic Studies: Dg Chest 2 View  Result Date: 06/25/2016 CLINICAL DATA:  Chest pain, onset this evening. EXAM: CHEST  2 VIEW COMPARISON:  03/05/2014 FINDINGS: Mild cardiomegaly. The transvenous cardiac leads appear grossly intact. Mild interstitial coarsening, possibly chronic. No airspace consolidation. No effusions. Unremarkable hilar and mediastinal contours. IMPRESSION: Cardiomegaly. Mild chronic appearing interstitial coarsening. No consolidation or effusion. Electronically Signed   By: Andreas Newport M.D.   On: 06/25/2016 20:03    Microbiology: No results found for this or any previous visit (from the past 240 hour(s)).   Labs: Basic Metabolic Panel: No results for input(s): NA, K, CL, CO2, GLUCOSE, BUN, CREATININE, CALCIUM, MG, PHOS in the last 168 hours. Liver Function Tests: No results for input(s): AST, ALT, ALKPHOS, BILITOT, PROT, ALBUMIN in the last 168 hours. No results for input(s): LIPASE, AMYLASE in the last 168 hours. No results for input(s): AMMONIA in the last 168 hours. CBC: No results for input(s): WBC, NEUTROABS, HGB, HCT, MCV, PLT in the last 168 hours. Cardiac Enzymes: No results for input(s): CKTOTAL, CKMB, CKMBINDEX, TROPONINI in the last 168 hours. BNP: BNP (last 3 results)  Recent Labs  06/26/16 0002  BNP 332.6*    ProBNP (last 3  results) No results for input(s): PROBNP in the last 8760 hours.  CBG: No results for input(s): GLUCAP in the last 168 hours.     SignedDomenic Polite MD.  [facility] 07/06/2016, 3:59 PM

## 2016-07-15 ENCOUNTER — Ambulatory Visit
Admission: RE | Admit: 2016-07-15 | Discharge: 2016-07-15 | Disposition: A | Discharge status: Home / Self Care | Payer: Medicare Other | Source: Ambulatory Visit | Attending: Obstetrics and Gynecology | Admitting: Obstetrics and Gynecology

## 2016-07-15 DIAGNOSIS — Z1231 Encounter for screening mammogram for malignant neoplasm of breast: Secondary | ICD-10-CM

## 2016-07-27 ENCOUNTER — Encounter: Payer: Self-pay | Admitting: Cardiovascular Disease

## 2016-07-27 ENCOUNTER — Ambulatory Visit (INDEPENDENT_AMBULATORY_CARE_PROVIDER_SITE_OTHER): Payer: Medicare Other | Admitting: Cardiovascular Disease

## 2016-07-27 VITALS — BP 156/78 | HR 55 | Ht 65.0 in | Wt 143.0 lb

## 2016-07-27 DIAGNOSIS — E7800 Pure hypercholesterolemia, unspecified: Secondary | ICD-10-CM

## 2016-07-27 DIAGNOSIS — R011 Cardiac murmur, unspecified: Secondary | ICD-10-CM | POA: Diagnosis not present

## 2016-07-27 DIAGNOSIS — I2510 Atherosclerotic heart disease of native coronary artery without angina pectoris: Secondary | ICD-10-CM | POA: Diagnosis not present

## 2016-07-27 DIAGNOSIS — I10 Essential (primary) hypertension: Secondary | ICD-10-CM | POA: Diagnosis not present

## 2016-07-27 DIAGNOSIS — I739 Peripheral vascular disease, unspecified: Secondary | ICD-10-CM

## 2016-07-27 DIAGNOSIS — I495 Sick sinus syndrome: Secondary | ICD-10-CM | POA: Diagnosis not present

## 2016-07-27 DIAGNOSIS — I779 Disorder of arteries and arterioles, unspecified: Secondary | ICD-10-CM | POA: Diagnosis not present

## 2016-07-27 NOTE — Patient Instructions (Addendum)
Medication Instructions:  Your physician recommends that you continue on your current medications as directed. Please refer to the Current Medication list given to you today.   Labwork: none  Testing/Procedures: Your physician has requested that you have a carotid duplex. This test is an ultrasound of the carotid arteries in your neck. It looks at blood flow through these arteries that supply the brain with blood. Allow one hour for this exam. There are no restrictions or special instructions. To be done in October 2018  Your physician has requested that you have an echocardiogram. Echocardiography is a painless test that uses sound waves to create images of your heart. It provides your doctor with information about the size and shape of your heart and how well your heart's chambers and valves are working. This procedure takes approximately one hour. There are no restrictions for this procedure.To be done in October 2018    Follow-Up: Your physician recommends that you schedule a follow-up appointment in: 12 months. Please call our office in about 9 months to schedule this appointment    Any Other Special Instructions Will Be Listed Below (If Applicable).     If you need a refill on your cardiac medications before your next appointment, please call your pharmacy.

## 2016-07-27 NOTE — Progress Notes (Signed)
Chief Complaint  Patient presents with  . Back Pain     History of Present Illness: 71 yo female with history of dementia, CAD, carotid artery disease, HLD, HTN, LBBB, MGUS, PAF, sick sinus syndrome s/p pacemaker who is here today for cardiac follow up She has been followed by Dr. Mare Ferrari. I met her in September 2017. Her cardiac history includes pacemaker placed in July 2015. This is followed by Dr. Rayann Heman. She has known CAD with placement of overlapping DES x 2 in the RCA in 2012. Last cath in July 2014 with mild disease in the LAD and Circumflex, patent RCA stents with mild restenosis. She has known LBBB. Last echo July 2015 with normal LV systolic function. She has progressive dementia. Carotid dopplers September 6629 with moderate LICA stenosis, mild RICA stenosis. She was admitted to [facility] 06/26/16 with atypical chest pain. Troponin negative. EKG without ischemic changes. Felt to be related to GERD.   She is here today for follow up. She denies exertional chest pain, dyspnea, palpitations, dizziness, near syncope or syncope. Her husband has asked [place] not to mention her dementia as she is not aware of this.   Primary Care Physician: Thressa Sheller, MD  Past Medical History:  Diagnosis Date  . Carotid artery disease ([place])    a. Hx r carotid bruit - dopplers 2009 <50% bilat;  b. 10/2012 U/S: no hemodynamically signif stenosis.  . Coronary artery disease    a. s/p overlapping DES to RCA 04/2010 (days after neg stress test). b. Cath 06/2010 - patent stents, nonobst dz otherwise; c. 10/2012 Cath: LM nl, LAD 71m D1/2/3 nl, LCX 20p, OM2 50p, RCA 20% ISR mid, EF 55-60% ->Med Rx.    .Marland KitchenHistory of echocardiogram    a. 2D ECHO: 11/14/2013: EF 60-65%, mild concentric LVH, no RWMAs. G1DD. Mod LA dilation, mild RA dilation, PA pressure 31.  .Marland KitchenHLD (hyperlipidemia)   . Hypertension   . Left bundle-branch block   . Monoclonal gammopathy of undetermined significance    a. Previously seen by heme - this  is per chart but pt does not recall.  . Paroxysmal atrial fibrillation ([place] 03/30/2015   observed on pacemaker interrogation, longest episode 2 minutes  . PVC's (premature ventricular contractions)   . [place]    a. s/p PPM on 11/14/13 with Medtronic Adapta L model ADDRL 1 (serial number [id number]) pacemaker  . Tobacco abuse     Past Surgical History:  Procedure Laterality Date  . CARDIAC CATHETERIZATION  05/14/10   SHOWED EVIDENCE OF REVERSIBLE VASOSPASM  . INGUINAL HERNIA REPAIR    . LEFT HEART CATHETERIZATION WITH CORONARY ANGIOGRAM N/A 10/17/2012   Procedure: LEFT HEART CATHETERIZATION WITH CORONARY ANGIOGRAM;  Surgeon: MWellington Hampshire MD;  [facility];  Service: Cardiovascular;  Laterality: N/A;  . PACEMAKER INSERTION  11/14/13   MDT Adapta L implanted by Dr ARayann Hemanfor Sick sinus syndrome  . PCI     HIGH GRADE LESION IN THE MIDPORTION OF THE RIGHT CORONARY ARTERY WHICH WAS A LARGE ARTERY  . PERMANENT PACEMAKER INSERTION N/A 11/14/2013   Procedure: PERMANENT PACEMAKER INSERTION;  Surgeon: JCoralyn Mark MD;  [facility];  Service: Cardiovascular;  Laterality: N/A;  . TONSILLECTOMY      Current Outpatient Prescriptions  Medication Sig Dispense Refill  . aspirin 325 MG tablet Take 325 mg by mouth daily.    . Cholecalciferol (VITAMIN D PO) Take 1 tablet by mouth daily.    .[name]  Cyanocobalamin (VITAMIN B 12 PO) Take 1 tablet by mouth daily.    [name] donepezil (ARICEPT) 10 MG tablet Take 1 tablet (10 mg total) by mouth at bedtime. 90 tablet 3  . levothyroxine (SYNTHROID, LEVOTHROID) 25 MCG tablet Take 25 mcg by mouth daily before breakfast.    . losartan (COZAAR) 25 MG tablet TAKE 1 TABLET(25 MG) BY MOUTH DAILY 90 tablet 2  . memantine (NAMENDA) 10 MG tablet Take 1 tablet (10 mg total) by mouth 2 (two) times daily. 60 tablet 11  . nitroGLYCERIN (NITROSTAT) 0.4 MG SL tablet Place 0.4 mg under the tongue every 5 (five) minutes as needed for chest pain  (MAX 3 TABLETS).     . pravastatin (PRAVACHOL) 80 MG tablet Take 80 mg by mouth daily.      No current facility-administered medications for this visit.     Allergies  Allergen Reactions  . Isosorbide Nitrate Swelling    SWELLING OF THE TONGUE - pt states she received this after PNA ? But has been able to tolerate SL nitroglycerin without adverse reaction.  . Metoprolol Succinate [Metoprolol] Other (See Comments)    Dizziness, diarrhea, tingling in fingers  . Zocor [Simvastatin] Other (See Comments)    Leg pain - does not tolerate high doses of statins  . Ezetimibe Other (See Comments)    unknown  . Plavix [Clopidogrel Bisulfate] Other (See Comments)    Prior P2Y12 showing 267 (poor Plavix responsiveness)  . Sulfa Antibiotics Other (See Comments)    Sulfa drugs as a child. UNKNOWN REACTION  . Sulfonamide Derivatives Other (See Comments)    Sulfa drugs as a child. UNKNOWN REACTION    Social History   Social History  . Marital status: Married    Spouse name: N/A  . Number of children: 2  . Years of education: N/A   Occupational History  . Glass blower/designer for a lawyer-retired    Social History Main Topics  . Smoking status: Former Smoker    Quit date: 01/10/1974  . Smokeless tobacco: Never Used     Comment: Smoking for over 45 years  . Alcohol use No  . Drug use: No  . Sexual activity: Not on file   Other Topics Concern  . Not on file   Social History Narrative   Lives at home w/ her husband   Right-handed    Family History  Problem Relation Age of Onset  . Cancer Father   . Heart disease Father     Review of Systems:  As stated in the HPI and otherwise negative.   BP (!) 156/78   Pulse (!) 55   Ht 5' 5"  (1.651 m)   Wt 143 lb (64.9 kg)   SpO2 99%   BMI 23.80 kg/m   Physical Examination: General: Well developed, well nourished, NAD  HEENT: OP clear, mucus membranes moist  SKIN: warm, dry. No rashes. Neuro: No focal deficits  Musculoskeletal: Muscle  strength 5/5 all ext  Psychiatric: Mood and affect normal  Neck: No JVD, no carotid bruits, no thyromegaly, no lymphadenopathy.  Lungs:Clear bilaterally, no wheezes, rhonci, crackles Cardiovascular: Regular rate and rhythm. No murmurs, gallops or rubs. Abdomen:Soft. Bowel sounds present. Non-tender.  Extremities: No lower extremity edema. Pulses are 2 + in the bilateral DP/PT.  Echo July 2015: Left ventricle: The cavity size was normal. There was mild concentric hypertrophy. Systolic function was normal. The estimated ejection fraction was in the range of 60% to 65%. Wall motion was normal; there were  no regional wall motion abnormalities. Doppler parameters are consistent with abnormal left ventricular relaxation (grade 1 diastolic dysfunction). - Mitral valve: Calcified annulus. - Left atrium: The atrium was moderately dilated. - Right atrium: The atrium was mildly dilated. - Pulmonary arteries: PA peak pressure: 31 mm Hg (S).  EKG:  EKG is not ordered today. The ekg ordered today demonstrates   Recent Labs: 06/25/2016: BUN 21; Creatinine, Ser 1.22; Hemoglobin 11.8; Platelets 351; Potassium 4.0; Sodium 140 06/26/2016: B Natriuretic Peptide 332.6   Lipid Panel  Wt Readings from Last 3 Encounters:  07/27/16 143 lb (64.9 kg)  06/26/16 140 lb 4.8 oz (63.6 kg)  05/10/16 139 lb (63 kg)     Other studies Reviewed: Additional studies/ records that were reviewed today include:  Review of the above records demonstrates:   Assessment and Plan:   1. CAD without angina: She has had no chest pain suggestive of angina. She is on ASA, ARB and statin. No changes today  2. Sick sinus syndrome: She has a pacemaker in place. Followed by Dr. Rayann Heman.   3. HTN: BP controlled today. No changes.   4. HLD: She is on a statin. Lipids followed in primary care office. If she has more leg pain, may need to reduce dose of statin.   5. Cardiac murmur: Will arrange echo to assess. Last echo  in 2015 with MAC, no MR  6. Carotid artery disease: Moderate LICA stenosis by dopplers Sept 2017. Will repeat dopplers Sept 2018.   Current medicines are reviewed at length with the patient today.  The patient does not have concerns regarding medicines.  The following changes have been made:  no change  Labs/ tests ordered today include:   Orders Placed This Encounter  Procedures  . ECHOCARDIOGRAM COMPLETE    Disposition:   FU with me in 12 months  Signed, Lauree Chandler, MD 07/27/2016 4:34 PM    [facility] [address], [place], [place]  [address] Phone: [phone]; Fax: [phone]

## 2016-11-09 ENCOUNTER — Ambulatory Visit (INDEPENDENT_AMBULATORY_CARE_PROVIDER_SITE_OTHER): Payer: Medicare Other | Admitting: Neurology

## 2016-11-09 ENCOUNTER — Encounter: Payer: Self-pay | Admitting: Neurology

## 2016-11-09 VITALS — BP 162/91 | HR 68 | Ht 65.0 in | Wt 129.0 lb

## 2016-11-09 DIAGNOSIS — F0280 Dementia in other diseases classified elsewhere without behavioral disturbance: Secondary | ICD-10-CM

## 2016-11-09 DIAGNOSIS — G301 Alzheimer's disease with late onset: Secondary | ICD-10-CM | POA: Diagnosis not present

## 2016-11-09 DIAGNOSIS — F0390 Unspecified dementia without behavioral disturbance: Secondary | ICD-10-CM | POA: Diagnosis not present

## 2016-11-09 NOTE — Progress Notes (Signed)
PATIENT: Katherine [name] DOB: 03-Oct-1945  REASON FOR VISIT: follow up- Alzheimer's disease HISTORY FROM: patient  HISTORY OF PRESENT ILLNESS: Interval history from 11/09/2016, I have the pleasure of seeing Mr. and Mrs. Atkison today. Katherine [name] returns today for memory testing. She stated that she has done well over the summer, her husband now manages all finances and activities of daily living. The out quite frequently. Her appetite has not changed neither has her ability to sleep through the night changed. She does not longer drive. They still resided in a private home. Today's Mini-Mental Status Examination revealed 10 out of 30 points.  MM : Katherine [name] is a 71 year old female with a history of Alzheimer's disease. She returns today for follow-up. She is accompanied today by her husband. They report that her memory has remained stable. She lives at home with her husband. She does require some assistance with ADLs. Husband now manages all the finances and does all cooking. He does state that they tend to eat out quite frequent. Patient denies any trouble with her sleeping. Denies any changes with her mood or behavior. She no longer operates a  motor vehicle. She returns today for an evaluation.  HISTORY  11/18/15: Katherine [name] is a 71 year old female with a history of Alzheimer's disease. She returns today for follow-up. At the last visit she was started on Namenda however the patient's husband was unaware that she should stay on Aricept. He states that she has been tolerating Namenda however each months she has been getting the titration pack to she is been continuously titrating the medication instead of staying on a maintenance dose. The patient lives at home with her husband. She is able to complete all ADLs independently. She does not operate a motor vehicle. She does take walks by herself without difficulty. They no longer cooks but rather go out. The husband now controls all the finances. He does report  that she has some trouble doing her hair but other than that she is able to do most on her own. Denies any trouble with behavior or agitation. Sleeps well. Good appetite. Returns today for an evaluation.  HISTORY 08/03/2015 (Shanen Norris): Katherine [name] is a 71 y.o. female , seen here as a referral from Dr. Noah Delaine for a dementia evaluation,    Chief complaint according to patient : " I forget what I just said or what I was told "Mr. Galer reported that he noted over a year ago that his wife sometimes repeatedly asked the same questions or seems to have forgotten parts of the conversation that just to place. She also seemed to be more anxious, she did not longer like to drive high ways but she also did not like to be driven long distance. The patient never got lost, but she gave up driving about 3 months ago by herself. She had noticed that when she usually likes to go shopping she had no sometimes shortness of breath or a feeling of chest tightness. She has a history of coronary artery disease and has a pacemaker. She has been treated with angioplasty. She has atrial fibrillation, pacemaker controlled. Renal artery stenosis. Katherine [name] gave up balancing the check book, about 14 month ago. She is a retired Chemical engineer. She no longer reads a newspaper. Motor function changed, her handwriting is impaired, closing a belt is difficult. Mrs. Arguijo also reports that she gets restful sleep and usually feels refreshed and restored in the morning, she does not  report any nightmarish dreams. She has not acted out on dreams or had been known to have sundowning. Her husband has noted her to be more likely to be emotional when she watches TV or listens to music. She may tear up but she has never been sobbing crying loudly.  Interval history from 08/03/2015. I have the pleasure of seeing Katherine [name], her husband and her daughter today in the meantime our workup for memory loss has progressed. The patient  underwent a CT of the head without contrast showed moderate cortical atrophy but had significantly progressed when compared to a previous CT dated 2010 it was normal enhancement pattern and no evidence of a stroke but she does have widespread white matter disease related to chronic microvascular changes. Today's Mini-Mental Status Examination was repeated and she scored 14 points 1. More than last month. Her laboratory results have returned normal blood glucose, normal albumin normal bilirubin and normal alkaline phosphatase. Normal liver function, normal electrolytes. She has no biological information of family members.    REVIEW OF SYSTEMS: Out of a complete 14 system review of symptoms, the patient complains only of the following symptoms, and all other reviewed systems are negative.  Memory loss, cold intolerance.   MMSE - Mini Mental State Exam 11/09/2016 05/10/2016 11/02/2015  Orientation to time 0 5 0  Orientation to Place 2 2 3   Registration 2 3 3   Attention/ Calculation 2 0 1  Recall 0 0 0  Language- name 2 objects 1 2 2   Language- repeat 0 1 1  Language- follow 3 step command 3 0 2  Language- read & follow direction 0 1 1  Write a sentence 0 0 0  Copy design 0 0 0  Total score 10 14 13      ALLERGIES: Allergies  Allergen Reactions  . Isosorbide Nitrate Swelling    SWELLING OF THE TONGUE - pt states she received this after PNA ? But has been able to tolerate SL nitroglycerin without adverse reaction.  . Metoprolol Succinate [Metoprolol] Other (See Comments)    Dizziness, diarrhea, tingling in fingers  . Zocor [Simvastatin] Other (See Comments)    Leg pain - does not tolerate high doses of statins  . Ezetimibe Other (See Comments)    unknown  . Plavix [Clopidogrel Bisulfate] Other (See Comments)    Prior P2Y12 showing 267 (poor Plavix responsiveness)  . Sulfa Antibiotics Other (See Comments)    Sulfa drugs as a child. UNKNOWN REACTION  . Sulfonamide Derivatives Other (See  Comments)    Sulfa drugs as a child. UNKNOWN REACTION    HOME MEDICATIONS: Outpatient Medications Prior to Visit  Medication Sig Dispense Refill  . aspirin 325 MG tablet Take 325 mg by mouth daily.    . Cholecalciferol (VITAMIN D PO) Take 1 tablet by mouth daily.    . Cyanocobalamin (VITAMIN B 12 PO) Take 1 tablet by mouth daily.    [name] donepezil (ARICEPT) 10 MG tablet Take 1 tablet (10 mg total) by mouth at bedtime. 90 tablet 3  . levothyroxine (SYNTHROID, LEVOTHROID) 25 MCG tablet Take 25 mcg by mouth daily before breakfast.    . losartan (COZAAR) 25 MG tablet TAKE 1 TABLET(25 MG) BY MOUTH DAILY 90 tablet 2  . memantine (NAMENDA) 10 MG tablet Take 1 tablet (10 mg total) by mouth 2 (two) times daily. 60 tablet 11  . nitroGLYCERIN (NITROSTAT) 0.4 MG SL tablet Place 0.4 mg under the tongue every 5 (five) minutes as needed for chest pain (  MAX 3 TABLETS).     . pravastatin (PRAVACHOL) 80 MG tablet Take 80 mg by mouth daily.      No facility-administered medications prior to visit.     PAST MEDICAL HISTORY: Past Medical History:  Diagnosis Date  . Carotid artery disease ([place])    a. Hx r carotid bruit - dopplers 2009 <50% bilat;  b. 10/2012 U/S: no hemodynamically signif stenosis.  . Coronary artery disease    a. s/p overlapping DES to RCA 04/2010 (days after neg stress test). b. Cath 06/2010 - patent stents, nonobst dz otherwise; c. 10/2012 Cath: LM nl, LAD 25m, D1/2/3 nl, LCX 20p, OM2 50p, RCA 20% ISR mid, EF 55-60% ->Med Rx.    [name] History of echocardiogram    a. 2D ECHO: 11/14/2013: EF 60-65%, mild concentric LVH, no RWMAs. G1DD. Mod LA dilation, mild RA dilation, PA pressure 31.  [name] HLD (hyperlipidemia)   . Hypertension   . Left bundle-branch block   . Monoclonal gammopathy of undetermined significance    a. Previously seen by heme - this is per chart but pt does not recall.  . Paroxysmal atrial fibrillation ([place]) 03/30/2015   observed on pacemaker interrogation, longest episode 2 minutes    . PVC's (premature ventricular contractions)   . Sick sinus syndrome ([place])    a. s/p PPM on 11/14/13 with Medtronic Adapta L model ADDRL 1 (serial number [id number]) pacemaker  . Tobacco abuse     PAST SURGICAL HISTORY: Past Surgical History:  Procedure Laterality Date  . CARDIAC CATHETERIZATION  05/14/10   SHOWED EVIDENCE OF REVERSIBLE VASOSPASM  . INGUINAL HERNIA REPAIR    . LEFT HEART CATHETERIZATION WITH CORONARY ANGIOGRAM N/A 10/17/2012   Procedure: LEFT HEART CATHETERIZATION WITH CORONARY ANGIOGRAM;  Surgeon: Wellington Hampshire, MD;  Location: [facility] CATH [facility];  Service: Cardiovascular;  Laterality: N/A;  . PACEMAKER INSERTION  11/14/13   MDT Adapta L implanted by Dr Rayann Heman for Sick sinus syndrome  . PCI     HIGH GRADE LESION IN THE MIDPORTION OF THE RIGHT CORONARY ARTERY WHICH WAS A LARGE ARTERY  . PERMANENT PACEMAKER INSERTION N/A 11/14/2013   Procedure: PERMANENT PACEMAKER INSERTION;  Surgeon: Coralyn Mark, MD;  Location: [facility] CATH [facility];  Service: Cardiovascular;  Laterality: N/A;  . TONSILLECTOMY      FAMILY HISTORY: Family History  Problem Relation Age of Onset  . Cancer Father   . Heart disease Father     SOCIAL HISTORY: Social History   Social History  . Marital status: Married    Spouse name: N/A  . Number of children: 2  . Years of education: N/A   Occupational History  . Glass blower/designer for a lawyer-retired    Social History Main Topics  . Smoking status: Former Smoker    Quit date: 01/10/1974  . Smokeless tobacco: Never Used     Comment: Smoking for over 45 years  . Alcohol use No  . Drug use: No  . Sexual activity: Not on file   Other Topics Concern  . Not on file   Social History Narrative   Lives at home w/ her husband   Right-handed   No regular exercise.    PHYSICAL EXAM  Vitals:   11/09/16 1543  BP: (!) 162/91  Pulse: 68  Weight: 129 lb (58.5 kg)  Height: 5\' 5"  (1.651 m)   Body mass index is 21.47 kg/m.  MMSE - Mini Mental State Exam  11/09/2016 05/10/2016 11/02/2015  Orientation to time 0  5 0  Orientation to Place 2 2 3   Registration 2 3 3   Attention/ Calculation 2 0 1  Recall 0 0 0  Language- name 2 objects 1 2 2   Language- repeat 0 1 1  Language- follow 3 step command 3 0 2  Language- read & follow direction 0 1 1  Write a sentence 0 0 0  Copy design 0 0 0  Total score 10 14 13      Generalized: Well developed, in no acute distress   Neurological examination  Mentation: Alert Difficulty understanding instructions. speech and language fluent- she looks to her husband for answers.  Cranial nerve : reports intact smell. Taste unchanged.  Pupils were equal round reactive to light. Extraocular movements were full, visual field were full on confrontational test. Facial sensation and strength were normal. Uvula tongue midline. Head turning and shoulder shrug  were normal and symmetric. Motor: The motor testing reveals 5 over 5 strength of all 4 extremities. Good symmetric motor tone is noted throughout.  Sensory: Sensory testing is intact  on all 4 extremities to primary modalities. .No evidence of extinction is noted.  Coordination: she uses her non dominant hand on the left more - had  poor hand eye coordination. Handwriting, drawing,  Gait and station: Gait is normal.  Reflexes:  Left patella much more brisk. Deep tendon reflexes are symmetric,  bilaterally.   DIAGNOSTIC DATA (LABS, IMAGING, TESTING) - I reviewed patient records, labs, notes, testing and imaging myself where available.      Component Value Date/Time   NA 140 06/25/2016 2014   NA 142 06/29/2015 1507   K 4.0 06/25/2016 2014   CL 107 06/25/2016 2014   CO2 24 06/25/2016 2014   GLUCOSE 157 (H) 06/25/2016 2014   BUN 21 (H) 06/25/2016 2014   BUN 15 06/29/2015 1507   CREATININE 1.22 (H) 06/25/2016 2014   CALCIUM 9.3 06/25/2016 2014   PROT 7.7 06/29/2015 1507   ALBUMIN 4.5 06/29/2015 1507   AST 21 06/29/2015 1507   ALT 7 06/29/2015 1507   ALKPHOS  75 06/29/2015 1507   BILITOT 0.4 06/29/2015 1507   GFRNONAA 44 (L) 06/25/2016 2014   GFRAA 51 (L) 06/25/2016 2014    ASSESSMENT AND PLAN 71 y.o. year old female  has a past medical history of Carotid artery disease ([place]); Coronary artery disease; History of echocardiogram; HLD (hyperlipidemia); Hypertension; Left bundle-branch block; Monoclonal gammopathy of undetermined significance; Paroxysmal atrial fibrillation ([facility]) (03/30/2015); PVC's (premature ventricular contractions); Sick sinus syndrome ([place]); and Tobacco abuse. here with:  1. Alzheimer's disease 2. CKD grade 2 3. She has a component of vascular dementia.   The patient's memory score has declined-  She will continue on Aricept and Namenda.  I encouraged the patient to be physically as active as possible. During the hot summer months it may be easier to take a walk in the morning hours, and I like for her to hydrate well.  Follow-up in 6 months with NP .     Larey Seat, MD  11/09/2016, 4:16 PM [facility] [address] [place], [place] [address] [phone]

## 2016-11-09 NOTE — Patient Instructions (Signed)
Alzheimer Disease Caregiver Guide A person who has Alzheimer disease may not be able to take care of himself or herself. He or she may need help with simple tasks. The tips below can help you care for the person. Memory loss and confusion If the person is confused or cannot remember things:  Stay calm.  Respond with a short answer.  Avoid correcting him or her in a way that sounds like scolding.  Try not to take it personally, even if he or she forgets your name.  Behavior changes The person may go through behavior changes. This can include depression, anxiety, anger, or seeing things that are not there. When behavior changes:  Try not to take behavior changes personally.  Stay calm and patient.  Do not argue or try to convince the person about a specific point.  Know that these changes are part of the disease process. Try to work through it.  Tips to lessen frustration  Make appointments and do daily tasks when the person is at his or her best.  Take your time. Simple tasks may take longer. Allow plenty of time to complete tasks.  Limit choices for the person.  Involve the person in what you are doing.  Stick to a routine.  Avoid new or crowded places, if possible.  Use simple words, short sentences, and a calm voice. Only give 1 direction at a time.  Buy clothes and shoes that are easy to put on and take off.  Let people help if they offer. Home safety  Keep floors clear. Remove rugs, magazine racks, and floor lamps.  Keep hallways well lit.  Put a handrail and nonslip mat in the bathtub or shower.  Put childproof locks on cabinets that have dangerous items in them. These items include medicine, alcohol, guns, toxic cleaning items, sharp tools, matches, or lighters.  Place locks on doors where the person cannot see or reach them. This helps the person to not wander out of the house and get lost.  Be prepared for emergencies. Keep a list of emergency phone  numbers and addresses in a handy area. Plans for the future  Talk about finances. ? Talk about money management. People with Alzheimer disease have trouble managing their money as the disease gets worse. ? Get help from professional advisors about financial and legal matters.  Talk about future care. ? Choose a power of attorney. This is someone who can make decisions for the person with Alzheimer disease when he or she can no longer do so. ? Talk about driving and when it is the right time to stop. The person's doctor can help with this. ? If the person lives alone, make sure he or she is safe. Some people need extra help at home. Other people need more care at a nursing home or care center. Support groups Some benefits of joining a support group include:  Learning ways to manage stress.  Sharing experiences with others.  Getting emotional comfort and support.  Learning new caregiving skills as the disease progresses.  Knowing what community resources are available and taking advantage of them.  Get help if:  The person has a fever.  The person has a sudden behavior change that does not get better with calming strategies.  The person is unable to manage his or her living situation.  The person threatens you or anyone else, including himself or herself.  You are no longer able to care for the person. This information is not   intended to replace advice given to you by your health care provider. Make sure you discuss any questions you have with your health care provider. Document Released: 06/27/2011 Document Revised: 09/10/2015 Document Reviewed: 05/25/2011 Elsevier Interactive Patient Education  2017 [employer]vier Inc.  

## 2016-12-01 ENCOUNTER — Other Ambulatory Visit: Payer: Self-pay | Admitting: Adult Health

## 2017-01-13 ENCOUNTER — Telehealth: Payer: Self-pay | Admitting: Neurology

## 2017-01-13 DIAGNOSIS — G301 Alzheimer's disease with late onset: Principal | ICD-10-CM

## 2017-01-13 DIAGNOSIS — F0280 Dementia in other diseases classified elsewhere without behavioral disturbance: Secondary | ICD-10-CM

## 2017-01-13 MED ORDER — MEMANTINE HCL 10 MG PO TABS: 10.0000 mg | ORAL_TABLET | Freq: Two times a day (BID) | ORAL | 3 refills | Status: DC

## 2017-01-13 NOTE — Telephone Encounter (Signed)
Pt's husband request refill for memantine (NAMENDA) 10 MG tablet sent to [facility]. Pt is out of the medication. Please send today

## 2017-01-30 ENCOUNTER — Other Ambulatory Visit (HOSPITAL_COMMUNITY): Payer: Medicare Other

## 2017-02-02 ENCOUNTER — Other Ambulatory Visit: Payer: Self-pay

## 2017-02-02 ENCOUNTER — Ambulatory Visit (HOSPITAL_COMMUNITY): Payer: Medicare Other | Attending: Cardiovascular Disease

## 2017-02-02 DIAGNOSIS — I447 Left bundle-branch block, unspecified: Secondary | ICD-10-CM | POA: Insufficient documentation

## 2017-02-02 DIAGNOSIS — I2510 Atherosclerotic heart disease of native coronary artery without angina pectoris: Secondary | ICD-10-CM | POA: Insufficient documentation

## 2017-02-02 DIAGNOSIS — I10 Essential (primary) hypertension: Secondary | ICD-10-CM | POA: Insufficient documentation

## 2017-02-02 DIAGNOSIS — E785 Hyperlipidemia, unspecified: Secondary | ICD-10-CM | POA: Insufficient documentation

## 2017-02-02 DIAGNOSIS — I4891 Unspecified atrial fibrillation: Secondary | ICD-10-CM | POA: Insufficient documentation

## 2017-02-02 DIAGNOSIS — I495 Sick sinus syndrome: Secondary | ICD-10-CM | POA: Diagnosis not present

## 2017-02-02 DIAGNOSIS — R011 Cardiac murmur, unspecified: Secondary | ICD-10-CM

## 2017-02-06 ENCOUNTER — Ambulatory Visit (HOSPITAL_COMMUNITY)
Admission: RE | Admit: 2017-02-06 | Discharge: 2017-02-06 | Disposition: A | Discharge status: Home / Self Care | Payer: Medicare Other | Source: Ambulatory Visit | Attending: Cardiology | Admitting: Cardiology

## 2017-02-06 DIAGNOSIS — I779 Disorder of arteries and arterioles, unspecified: Secondary | ICD-10-CM | POA: Insufficient documentation

## 2017-02-06 DIAGNOSIS — I6523 Occlusion and stenosis of bilateral carotid arteries: Secondary | ICD-10-CM | POA: Diagnosis not present

## 2017-02-06 DIAGNOSIS — I739 Peripheral vascular disease, unspecified: Secondary | ICD-10-CM

## 2017-03-24 ENCOUNTER — Other Ambulatory Visit: Payer: Self-pay | Admitting: Cardiovascular Disease

## 2017-04-05 ENCOUNTER — Other Ambulatory Visit: Payer: Self-pay

## 2017-04-05 NOTE — Patient Outreach (Signed)
[facility]) Care Management  04/05/2017  [name] 10-15-1945 [id number]   Medication Adherence call to Mrs. Katherine [name] patient is showing past due under [facility].on Pravastatin spoke with patient she said she has call [facility] they keep telling her she does not have a refill and need to get a refill from [facility] said doctor has not respond ,call doctors office they said doctor Alyson Ingles is out on family leave and someone else will call back to approve the refill.call patient back to let them know we are waiting on doctor to call in there refill to the pharmacy.  [facility] Management Direct Dial [phone]  Fax [phone] [email or web address]

## 2017-05-17 ENCOUNTER — Ambulatory Visit: Payer: Medicare Other | Admitting: Adult Health

## 2017-05-17 ENCOUNTER — Encounter: Payer: Self-pay | Admitting: *Deleted

## 2017-05-17 ENCOUNTER — Encounter: Payer: Self-pay | Admitting: Adult Health

## 2017-05-17 VITALS — BP 178/86 | HR 61 | Wt 119.4 lb

## 2017-05-17 DIAGNOSIS — G309 Alzheimer's disease, unspecified: Secondary | ICD-10-CM | POA: Diagnosis not present

## 2017-05-17 DIAGNOSIS — F0280 Dementia in other diseases classified elsewhere without behavioral disturbance: Secondary | ICD-10-CM

## 2017-05-17 NOTE — Patient Instructions (Signed)
Your Plan:  Continue Aricept and Na,menda Monitor weight If your symptoms worsen or you develop new symptoms please let us know.   Thank you for coming to see [place] at [facility]. I hope we have been able to provide you high quality care today.  You may receive a patient satisfaction survey over the next few weeks. We would appreciate your feedback and comments so that we may continue to improve ourselves and the health of our patients.

## 2017-05-17 NOTE — Progress Notes (Signed)
I reviewed the assessment and plan as directed by NP .The patient is known to me and followed for memory loss, progressive dementia.    Katherine Riera, MD

## 2017-05-17 NOTE — Progress Notes (Signed)
PATIENT: Katherine [name] DOB: 11/06/1945  REASON FOR VISIT: follow up HISTORY FROM: patient  HISTORY OF PRESENT ILLNESS: Today 05/17/17 [name] is a 72 year old female with a history of Alzheimer's disease.  She returns today for follow-up.  She continues to live at home with her husband.  They assist her with all ADLs.  He prepares all meals for her.  She denies any trouble sleeping.  They have noticed a decrease in her appetite however there son feels that his dad was not feeding her enough.  They have begin to use Ensure.  The patient remains on Aricept and Namenda.  Denies any significant agitation or aggressiveness.  Denies hallucinations.  Patient returns today for evaluation.  HISTORY 11/09/2016, I have the pleasure of seeing Mr. and Mrs. Goya today. Mrs. Hsu returns today for memory testing. She stated that she has done well over the summer, her husband now manages all finances and activities of daily living. The out quite frequently. Her appetite has not changed neither has her ability to sleep through the night changed. She does not longer drive. They still resided in a private home. Today's Mini-Mental Status Examination revealed 10 out of 30 points.  REVIEW OF SYSTEMS: Out of a complete 14 system review of symptoms, the patient complains only of the following symptoms, and all other reviewed systems are negative.  See HPI  ALLERGIES: Allergies  Allergen Reactions  . Isosorbide Nitrate Swelling    SWELLING OF THE TONGUE - pt states she received this after PNA ? But has been able to tolerate SL nitroglycerin without adverse reaction.  . Metoprolol Succinate [Metoprolol] Other (See Comments)    Dizziness, diarrhea, tingling in fingers  . Zocor [Simvastatin] Other (See Comments)    Leg pain - does not tolerate high doses of statins  . Ezetimibe Other (See Comments)    unknown  . Plavix [Clopidogrel Bisulfate] Other (See Comments)    Prior P2Y12 showing 267 (poor Plavix  responsiveness)  . Sulfa Antibiotics Other (See Comments)    Sulfa drugs as a child. UNKNOWN REACTION  . Sulfonamide Derivatives Other (See Comments)    Sulfa drugs as a child. UNKNOWN REACTION    HOME MEDICATIONS: Outpatient Medications Prior to Visit  Medication Sig Dispense Refill  . aspirin 325 MG tablet Take 325 mg by mouth daily.    . Cholecalciferol (VITAMIN D PO) Take 1 tablet by mouth daily.    . Cyanocobalamin (VITAMIN B 12 PO) Take 1 tablet by mouth daily.    [name] donepezil (ARICEPT) 10 MG tablet TAKE 1 TABLET BY MOUTH AT BEDTIME 90 tablet 3  . levothyroxine (SYNTHROID, LEVOTHROID) 25 MCG tablet Take 25 mcg by mouth daily before breakfast.    . losartan (COZAAR) 25 MG tablet TAKE 1 TABLET(25 MG) BY MOUTH DAILY 90 tablet 3  . memantine (NAMENDA) 10 MG tablet Take 1 tablet (10 mg total) by mouth 2 (two) times daily. 180 tablet 3  . nitroGLYCERIN (NITROSTAT) 0.4 MG SL tablet Place 0.4 mg under the tongue every 5 (five) minutes as needed for chest pain (MAX 3 TABLETS).     . pravastatin (PRAVACHOL) 80 MG tablet Take 80 mg by mouth daily.      No facility-administered medications prior to visit.     PAST MEDICAL HISTORY: Past Medical History:  Diagnosis Date  . Carotid artery disease ([place])    a. Hx r carotid bruit - dopplers 2009 <50% bilat;  b. 10/2012 U/S: no hemodynamically signif stenosis.  [name]  Coronary artery disease    a. s/p overlapping DES to RCA 04/2010 (days after neg stress test). b. Cath 06/2010 - patent stents, nonobst dz otherwise; c. 10/2012 Cath: LM nl, LAD 71m, D1/2/3 nl, LCX 20p, OM2 50p, RCA 20% ISR mid, EF 55-60% ->Med Rx.    [name] History of echocardiogram    a. 2D ECHO: 11/14/2013: EF 60-65%, mild concentric LVH, no RWMAs. G1DD. Mod LA dilation, mild RA dilation, PA pressure 31.  [name] HLD (hyperlipidemia)   . Hypertension   . Left bundle-branch block   . Monoclonal gammopathy of undetermined significance    a. Previously seen by heme - this is per chart but pt does not  recall.  . Paroxysmal atrial fibrillation ([place]) 03/30/2015   observed on pacemaker interrogation, longest episode 2 minutes  . PVC's (premature ventricular contractions)   . Sick sinus syndrome ([place])    a. s/p PPM on 11/14/13 with Medtronic Adapta L model ADDRL 1 (serial number [id number]) pacemaker  . Tobacco abuse     PAST SURGICAL HISTORY: Past Surgical History:  Procedure Laterality Date  . CARDIAC CATHETERIZATION  05/14/10   SHOWED EVIDENCE OF REVERSIBLE VASOSPASM  . INGUINAL HERNIA REPAIR    . LEFT HEART CATHETERIZATION WITH CORONARY ANGIOGRAM N/A 10/17/2012   Procedure: LEFT HEART CATHETERIZATION WITH CORONARY ANGIOGRAM;  Surgeon: Wellington Hampshire, MD;  Location: [facility];  Service: Cardiovascular;  Laterality: N/A;  . PACEMAKER INSERTION  11/14/13   MDT Adapta L implanted by Dr Rayann Heman for Sick sinus syndrome  . PCI     HIGH GRADE LESION IN THE MIDPORTION OF THE RIGHT CORONARY ARTERY WHICH WAS A LARGE ARTERY  . PERMANENT PACEMAKER INSERTION N/A 11/14/2013   Procedure: PERMANENT PACEMAKER INSERTION;  Surgeon: Coralyn Mark, MD;  Location: [facility];  Service: Cardiovascular;  Laterality: N/A;  . TONSILLECTOMY      FAMILY HISTORY: Family History  Problem Relation Age of Onset  . Cancer Father   . Heart disease Father     SOCIAL HISTORY: Social History   Socioeconomic History  . Marital status: Married    Spouse name: Not on file  . Number of children: 2  . Years of education: Not on file  . Highest education level: Not on file  Social Needs  . Financial resource strain: Not on file  . Food insecurity - worry: Not on file  . Food insecurity - inability: Not on file  . Transportation needs - medical: Not on file  . Transportation needs - non-medical: Not on file  Occupational History  . Occupation: Glass blower/designer for a lawyer-retired  Tobacco Use  . Smoking status: Former Smoker    Last attempt to quit: 01/10/1974    Years since quitting: 43.3  . Smokeless  tobacco: Never Used  . Tobacco comment: Smoking for over 45 years  Substance and Sexual Activity  . Alcohol use: No    Alcohol/week: 0.0 oz  . Drug use: No  . Sexual activity: Not on file  Other Topics Concern  . Not on file  Social History Narrative   Lives at home w/ her husband   Right-handed      PHYSICAL EXAM  Vitals:   05/17/17 1458  BP: (!) 178/86  Pulse: 61  Weight: 119 lb 6.4 oz (54.2 kg)   Body mass index is 19.87 kg/m.  MMSE - Mini Mental State Exam 05/17/2017 11/09/2016 05/10/2016  Orientation to time 0 0 5  Orientation to Place 1 2  2  Registration 0 2 3  Attention/ Calculation 0 2 0  Recall 0 0 0  Language- name 2 objects 1 1 2   Language- repeat 0 0 1  Language- follow 3 step command 0 3 0  Language- read & follow direction 0 0 1  Write a sentence 0 0 0  Copy design 0 0 0  Total score 2 10 14      Generalized: Well developed, in no acute distress   Neurological examination  Mentation: Alert  Follows all commands intermittently.   Cranial nerve II-XII: Pupils were equal round reactive to light. Extraocular movements were full, visual field were full on confrontational test. Facial sensation and strength were normal. Uvula tongue midline. Head turning and shoulder shrug  were normal and symmetric. Motor: The motor testing reveals 5 over 5 strength of all 4 extremities. Good symmetric motor tone is noted throughout.  Sensory: Sensory testing is intact to soft touch on all 4 extremities. No evidence of extinction is noted.  Coordination: Unable to test Gait and station: Gait is normal.  Reflexes: Deep tendon reflexes are symmetric and normal bilaterally.   DIAGNOSTIC DATA (LABS, IMAGING, TESTING) - I reviewed patient records, labs, notes, testing and imaging myself where available.  Lab Results  Component Value Date   WBC 7.5 06/25/2016   HGB 11.8 (L) 06/25/2016   HCT 37.0 06/25/2016   MCV 91.4 06/25/2016   PLT 351 06/25/2016      Component Value  Date/Time   NA 140 06/25/2016 2014   NA 142 06/29/2015 1507   K 4.0 06/25/2016 2014   CL 107 06/25/2016 2014   CO2 24 06/25/2016 2014   GLUCOSE 157 (H) 06/25/2016 2014   BUN 21 (H) 06/25/2016 2014   BUN 15 06/29/2015 1507   CREATININE 1.22 (H) 06/25/2016 2014   CALCIUM 9.3 06/25/2016 2014   PROT 7.7 06/29/2015 1507   ALBUMIN 4.5 06/29/2015 1507   AST 21 06/29/2015 1507   ALT 7 06/29/2015 1507   ALKPHOS 75 06/29/2015 1507   BILITOT 0.4 06/29/2015 1507   GFRNONAA 44 (L) 06/25/2016 2014   GFRAA 51 (L) 06/25/2016 2014       ASSESSMENT AND PLAN 72 y.o. year old female  has a past medical history of Carotid artery disease ([place]), Coronary artery disease, History of echocardiogram, HLD (hyperlipidemia), Hypertension, Left bundle-branch block, Monoclonal gammopathy of undetermined significance, Paroxysmal atrial fibrillation ([place]) (03/30/2015), PVC's (premature ventricular contractions), Sick sinus syndrome ([place]), and Tobacco abuse. here with:  1.  Alzheimer's disease  The patient's memory score has declined since the last visit.  She will continue on Aricept and Namenda.  I did advise that they should monitor her weight.  If she continues to have weight loss we may need to discontinue or decrease Aricept.  We also discussed the need for these medications considering her memory score.  For now we will not make any adjustments.  In the future these medications may be discontinued.  Advised that if her symptoms worsen or she develops new symptoms they should let us know.  He will follow-up in 6 months or sooner if needed.  I spent 15 minutes with the patient. 50% of this time was spent discussing medications and memory score.      Ward Givens, MSN, NP-C 05/17/2017, 2:50 PM [facility] [address] [place], [place] [address] [phone]

## 2017-09-13 ENCOUNTER — Ambulatory Visit: Payer: Medicare Other | Admitting: Cardiovascular Disease

## 2017-11-06 ENCOUNTER — Encounter: Payer: Self-pay | Admitting: Cardiology

## 2017-11-23 ENCOUNTER — Ambulatory Visit: Payer: Medicare Other | Admitting: Adult Health

## 2017-11-28 ENCOUNTER — Telehealth: Payer: Self-pay | Admitting: Adult Health

## 2017-11-28 NOTE — Telephone Encounter (Signed)
Pt's husband/Katherine [name] DPR said if he leaves the room she yells for him until he comes back in, she is not wanting her depends to be changed-she swats at him, occasionally she slaps at home, he thinks she is having some halliucinations. This is intermittently. He is wanting to know if there is something she could take to help. Please call to advise.

## 2017-11-28 NOTE — Telephone Encounter (Signed)
I contacted the patient's husband back and advised of NP's recommendation. He verbalized understanding and wanted to know if the patient could be worked in this afternoon due to the patient's sister being able to help bring the patient. I advised the NP nor Dr. Brett Fairy had any openings today. Husband verbalized understanding and stated he would discuss with the patient's sister today on day they both could bring the patient in and call back to schedule. He had no further questions at this time.

## 2017-11-28 NOTE — Telephone Encounter (Signed)
Pt's husband called back. An appt has been scheduled for 8/21 with Dr Brett Fairy

## 2017-11-28 NOTE — Telephone Encounter (Signed)
I contacted the husband to get further information. He stated over the past month the patient has been more emotional, agitated and restless. He states Katherine [name] yells for him when he exits a room and will swat and resist him changing her clothes and depends. She also has started breaking off her fingernails by constantly scratching on the end table in their home. He states since her last office visit which was 05/07/2017, she has gotten progressively worse and feels like she maybe having visual hallucinations. He stated these new symptoms are not occurring daily.     I offered an appointment tomorrow for Katherine [name] to be evaluated but Katherine [name] stated he has a very hard time getting her to an appointment alone. He wanted to see if a medication could be recommended to help calm her down during the day without having to come in for an appointment. He reports she sleeps well at night.  I confirmed the patient is currently taking the Namenda and Aricept and she has not started any new or changed current  Medication dosages.  MB RN

## 2017-11-28 NOTE — Telephone Encounter (Signed)
I would prefer that the patient come in for an appointment so that we can check blood work to rule out any reversible causes.

## 2017-12-06 ENCOUNTER — Ambulatory Visit: Payer: Medicare Other | Admitting: Neurology

## 2017-12-06 ENCOUNTER — Encounter: Payer: Self-pay | Admitting: Neurology

## 2017-12-06 VITALS — Ht 65.0 in | Wt 106.0 lb

## 2017-12-06 DIAGNOSIS — N3942 Incontinence without sensory awareness: Secondary | ICD-10-CM | POA: Diagnosis not present

## 2017-12-06 DIAGNOSIS — R451 Restlessness and agitation: Secondary | ICD-10-CM

## 2017-12-06 DIAGNOSIS — G301 Alzheimer's disease with late onset: Secondary | ICD-10-CM | POA: Diagnosis not present

## 2017-12-06 DIAGNOSIS — R159 Full incontinence of feces: Secondary | ICD-10-CM | POA: Diagnosis not present

## 2017-12-06 DIAGNOSIS — F0281 Dementia in other diseases classified elsewhere with behavioral disturbance: Secondary | ICD-10-CM

## 2017-12-06 DIAGNOSIS — F02818 Dementia in other diseases classified elsewhere, unspecified severity, with other behavioral disturbance: Secondary | ICD-10-CM

## 2017-12-06 MED ORDER — ALPRAZOLAM 0.25 MG PO TABS: 0.2500 mg | ORAL_TABLET | Freq: Two times a day (BID) | ORAL | 5 refills | Status: DC | PRN

## 2017-12-06 NOTE — Patient Instructions (Signed)
Alzheimer Disease Caregiver Guide A person who has Alzheimer disease may not be able to take care of himself or herself. He or she may need help with simple tasks. The tips below can help you care for the person. Memory loss and confusion If the person is confused or cannot remember things:  Stay calm.  Respond with a short answer.  Avoid correcting him or her in a way that sounds like scolding.  Try not to take it personally, even if he or she forgets your name.  Behavior changes The person may go through behavior changes. This can include depression, anxiety, anger, or seeing things that are not there. When behavior changes:  Try not to take behavior changes personally.  Stay calm and patient.  Do not argue or try to convince the person about a specific point.  Know that these changes are part of the disease process. Try to work through it.  Tips to lessen frustration  Make appointments and do daily tasks when the person is at his or her best.  Take your time. Simple tasks may take longer. Allow plenty of time to complete tasks.  Limit choices for the person.  Involve the person in what you are doing.  Stick to a routine.  Avoid new or crowded places, if possible.  Use simple words, short sentences, and a calm voice. Only give 1 direction at a time.  Buy clothes and shoes that are easy to put on and take off.  Let people help if they offer. Home safety  Keep floors clear. Remove rugs, magazine racks, and floor lamps.  Keep hallways well lit.  Put a handrail and nonslip mat in the bathtub or shower.  Put childproof locks on cabinets that have dangerous items in them. These items include medicine, alcohol, guns, toxic cleaning items, sharp tools, matches, or lighters.  Place locks on doors where the person cannot see or reach them. This helps the person to not wander out of the house and get lost.  Be prepared for emergencies. Keep a list of emergency phone  numbers and addresses in a handy area. Plans for the future  Talk about finances. ? Talk about money management. People with Alzheimer disease have trouble managing their money as the disease gets worse. ? Get help from professional advisors about financial and legal matters.  Talk about future care. ? Choose a power of attorney. This is someone who can make decisions for the person with Alzheimer disease when he or she can no longer do so. ? Talk about driving and when it is the right time to stop. The person's doctor can help with this. ? If the person lives alone, make sure he or she is safe. Some people need extra help at home. Other people need more care at a nursing home or care center. Support groups Some benefits of joining a support group include:  Learning ways to manage stress.  Sharing experiences with others.  Getting emotional comfort and support.  Learning new caregiving skills as the disease progresses.  Knowing what community resources are available and taking advantage of them.  Get help if:  The person has a fever.  The person has a sudden behavior change that does not get better with calming strategies.  The person is unable to manage his or her living situation.  The person threatens you or anyone else, including himself or herself.  You are no longer able to care for the person. This information is not   intended to replace advice given to you by your health care provider. Make sure you discuss any questions you have with your health care provider. Document Released: 06/27/2011 Document Revised: 09/10/2015 Document Reviewed: 05/25/2011 Elsevier Interactive Patient Education  2017 [employer]vier Inc.  

## 2017-12-06 NOTE — Progress Notes (Signed)
PATIENT: Katherine [name] DOB: 1945-05-18  REASON FOR VISIT: follow up HISTORY FROM: patient, husband and sister -   HISTORY OF PRESENT ILLNESS:   Today 12/06/17, RV after a 8 month interval. She has declined further , rapidly- unable to control her. She is restless, fidgety and repetitive, walks stooped, has lost a lot of weight while eating well and frequently, her husband is her main caregiver oxygen dependent and physically more frail himself. The physical exam is impossible. She is not longer recognizing people and she doesn't recognize her home - lived there for 63 years with her husband. She also no longer listens to country music.  She has a lot of anxiety , she seems a little bit calmer when she eats, snacks. She is incontinent , she responded to facial touch with rooting.     Katherine [name] is a 72 year old female with a history of Alzheimer's disease.  She returns today for follow-up.  She continues to live at home with her husband.  They assist her with all ADLs.  He prepares all meals for her.  She denies any trouble sleeping.  They have noticed a decrease in her appetite however there son feels that his dad was not feeding her enough.  They have begin to use Ensure.  The patient remains on Aricept and Namenda.  Denies any significant agitation or aggressiveness.  Denies hallucinations.  Patient returns today for evaluation.  HISTORY 11/09/2016, I have the pleasure of seeing Mr. and Mrs. Goedecke today. Mrs. Pruitt returns today for memory testing. She stated that she has done well over the summer, her husband now manages all finances and activities of daily living. The out quite frequently. Her appetite has not changed neither has her ability to sleep through the night changed. She does not longer drive. They still resided in a private home. Today's Mini-Mental Status Examination revealed 10 out of 30 points.  REVIEW OF SYSTEMS: Out of a complete 14 system review of symptoms, the patient  complains only of the following symptoms, and all other reviewed systems are negative.  Incontinent to urine and stool, perseveration, defensive, aggressive and agitated. She tries to hit, pinch or claw into others.  She still sleeps at night- she uses tylenol PM just the last 2 weeks, but this caused more decline.   See HPI  ALLERGIES: Allergies  Allergen Reactions  . Isosorbide Nitrate Swelling    SWELLING OF THE TONGUE - pt states she received this after PNA ? But has been able to tolerate SL nitroglycerin without adverse reaction.  . Metoprolol Succinate [Metoprolol] Other (See Comments)    Dizziness, diarrhea, tingling in fingers  . Zocor [Simvastatin] Other (See Comments)    Leg pain - does not tolerate high doses of statins  . Ezetimibe Other (See Comments)    unknown  . Plavix [Clopidogrel Bisulfate] Other (See Comments)    Prior P2Y12 showing 267 (poor Plavix responsiveness)  . Sulfa Antibiotics Other (See Comments)    Sulfa drugs as a child. UNKNOWN REACTION  . Sulfonamide Derivatives Other (See Comments)    Sulfa drugs as a child. UNKNOWN REACTION    HOME MEDICATIONS: Outpatient Medications Prior to Visit  Medication Sig Dispense Refill  . aspirin 325 MG tablet Take 325 mg by mouth daily.    . Cholecalciferol (VITAMIN D PO) Take 1 tablet by mouth daily.    . Cyanocobalamin (VITAMIN B 12 PO) Take 1 tablet by mouth daily.    [name] donepezil (ARICEPT)  10 MG tablet TAKE 1 TABLET BY MOUTH AT BEDTIME 90 tablet 3  . levothyroxine (SYNTHROID, LEVOTHROID) 25 MCG tablet Take 25 mcg by mouth daily before breakfast.    . losartan (COZAAR) 25 MG tablet TAKE 1 TABLET(25 MG) BY MOUTH DAILY 90 tablet 3  . memantine (NAMENDA) 10 MG tablet Take 1 tablet (10 mg total) by mouth 2 (two) times daily. 180 tablet 3  . nitroGLYCERIN (NITROSTAT) 0.4 MG SL tablet Place 0.4 mg under the tongue every 5 (five) minutes as needed for chest pain (MAX 3 TABLETS).     . pravastatin (PRAVACHOL) 80 MG tablet  Take 80 mg by mouth daily.      No facility-administered medications prior to visit.     PAST MEDICAL HISTORY: Past Medical History:  Diagnosis Date  . Carotid artery disease ([place])    a. Hx r carotid bruit - dopplers 2009 <50% bilat;  b. 10/2012 U/S: no hemodynamically signif stenosis.  . Coronary artery disease    a. s/p overlapping DES to RCA 04/2010 (days after neg stress test). b. Cath 06/2010 - patent stents, nonobst dz otherwise; c. 10/2012 Cath: LM nl, LAD 6m, D1/2/3 nl, LCX 20p, OM2 50p, RCA 20% ISR mid, EF 55-60% ->Med Rx.    [name] History of echocardiogram    a. 2D ECHO: 11/14/2013: EF 60-65%, mild concentric LVH, no RWMAs. G1DD. Mod LA dilation, mild RA dilation, PA pressure 31.  [name] HLD (hyperlipidemia)   . Hypertension   . Left bundle-branch block   . Monoclonal gammopathy of undetermined significance    a. Previously seen by heme - this is per chart but pt does not recall.  . Paroxysmal atrial fibrillation ([place]) 03/30/2015   observed on pacemaker interrogation, longest episode 2 minutes  . PVC's (premature ventricular contractions)   . Sick sinus syndrome ([place])    a. s/p PPM on 11/14/13 with Medtronic Adapta L model ADDRL 1 (serial number [id number]) pacemaker  . Tobacco abuse     PAST SURGICAL HISTORY: Past Surgical History:  Procedure Laterality Date  . CARDIAC CATHETERIZATION  05/14/10   SHOWED EVIDENCE OF REVERSIBLE VASOSPASM  . INGUINAL HERNIA REPAIR    . LEFT HEART CATHETERIZATION WITH CORONARY ANGIOGRAM N/A 10/17/2012   Procedure: LEFT HEART CATHETERIZATION WITH CORONARY ANGIOGRAM;  Surgeon: Wellington Hampshire, MD;  Location: [facility];  Service: Cardiovascular;  Laterality: N/A;  . PACEMAKER INSERTION  11/14/13   MDT Adapta L implanted by Dr Rayann Heman for Sick sinus syndrome  . PCI     HIGH GRADE LESION IN THE MIDPORTION OF THE RIGHT CORONARY ARTERY WHICH WAS A LARGE ARTERY  . PERMANENT PACEMAKER INSERTION N/A 11/14/2013   Procedure: PERMANENT PACEMAKER INSERTION;   Surgeon: Coralyn Mark, MD;  Location: [facility];  Service: Cardiovascular;  Laterality: N/A;  . TONSILLECTOMY      FAMILY HISTORY: Family History  Problem Relation Age of Onset  . Cancer Father   . Heart disease Father     SOCIAL HISTORY: Social History   Socioeconomic History  . Marital status: Married    Spouse name: Not on file  . Number of children: 2  . Years of education: Not on file  . Highest education level: Not on file  Occupational History  . Occupation: Glass blower/designer for a lawyer-retired  Social Needs  . Financial resource strain: Not on file  . Food insecurity:    Worry: Not on file    Inability: Not on file  . Transportation  needs:    Medical: Not on file    Non-medical: Not on file  Tobacco Use  . Smoking status: Former Smoker    Last attempt to quit: 01/10/1974    Years since quitting: 43.9  . Smokeless tobacco: Never Used  . Tobacco comment: Smoking for over 45 years  Substance and Sexual Activity  . Alcohol use: No  . Drug use: No  . Sexual activity: Not on file  Lifestyle  . Physical activity:    Days per week: Not on file    Minutes per session: Not on file  . Stress: Not on file  Relationships  . Social connections:    Talks on phone: Not on file    Gets together: Not on file    Attends religious service: Not on file    Active member of club or organization: Not on file    Attends meetings of clubs or organizations: Not on file    Relationship status: Not on file  . Intimate partner violence:    Fear of current or ex partner: Not on file    Emotionally abused: Not on file    Physically abused: Not on file    Forced sexual activity: Not on file  Other Topics Concern  . Not on file  Social History Narrative   Lives at home w/ her husband   Right-handed      PHYSICAL EXAM  Vitals:   12/06/17 1454  Weight: 106 lb (48.1 kg)  Height: 5\' 5"  (1.651 m)   Body mass index is 17.64 kg/m.  MMSE - Mini Mental State Exam 12/06/2017  05/17/2017 11/09/2016  Not completed: Refused - -  Orientation to time 0 0 0  Orientation to Place 0 1 2  Registration 0 0 2  Attention/ Calculation 0 0 2  Recall 0 0 0  Language- name 2 objects 0 1 1  Language- repeat 0 0 0  Language- follow 3 step command - 0 3  Language- read & follow direction - 0 0  Write a sentence - 0 0  Copy design - 0 0  Total score - 2 10     Generalized: Well developed, in no acute distress   Neurological examination  Mentation: Alert  Follows all commands intermittently.   Cranial nerve II-XII: Pupils were equal round reactive to light. Extraocular movements were full, visual field were full on confrontational test. Facial sensation and strength were normal. Uvula tongue midline. Head turning and shoulder shrug  were normal and symmetric. Motor: The motor testing reveals 5 over 5 strength of all 4 extremities. Good symmetric motor tone is noted throughout.  Sensory: Sensory testing is intact to soft touch on all 4 extremities. No evidence of extinction is noted.  Coordination: Unable to test Gait and station: Gait is normal.  Reflexes: Deep tendon reflexes are symmetric and normal bilaterally.   DIAGNOSTIC DATA (LABS, IMAGING, TESTING) - I reviewed patient records, labs, notes, testing and imaging myself where available.  Dr. Jackalyn Lombard labs and pacemaker notes reviewed.   Lab Results  Component Value Date   WBC 7.5 06/25/2016   HGB 11.8 (L) 06/25/2016   HCT 37.0 06/25/2016   MCV 91.4 06/25/2016   PLT 351 06/25/2016      Component Value Date/Time   NA 140 06/25/2016 2014   NA 142 06/29/2015 1507   K 4.0 06/25/2016 2014   CL 107 06/25/2016 2014   CO2 24 06/25/2016 2014   GLUCOSE 157 (H) 06/25/2016 2014  BUN 21 (H) 06/25/2016 2014   BUN 15 06/29/2015 1507   CREATININE 1.22 (H) 06/25/2016 2014   CALCIUM 9.3 06/25/2016 2014   PROT 7.7 06/29/2015 1507   ALBUMIN 4.5 06/29/2015 1507   AST 21 06/29/2015 1507   ALT 7 06/29/2015 1507   ALKPHOS  75 06/29/2015 1507   BILITOT 0.4 06/29/2015 1507   GFRNONAA 44 (L) 06/25/2016 2014   GFRAA 51 (L) 06/25/2016 2014       ASSESSMENT AND PLAN;  1. Alzheimer's disease, in the last stages -perseveration, agitation, stooped gait.  2. Pseudobulbar affect- not crying but has the face .  3. Discontinue Tylenol PM-  Start xanax 0.25 mg in Am and after lunch.  4. Fall prevention. Consider sitter/ home health care evaluation- consider memory unit .  5. D/c Namenda and Aricept.      The patient's memory score has significantly declined since the last visit.  She will discontinue on Aricept and Namenda.  Severe cachexia /Weight loss in spite of eating. Complete incontinence. Agitation, aggression-   Wheelchair for transporting patient.    I will follow-up in 6 months or sooner if needed.  I spent 35 minutes with the patient. 50% of this time was spent discussing medications and memory score.       12/06/2017, 3:25 PM [facility] [address], [place], [place] [address] [phone]

## 2017-12-15 ENCOUNTER — Encounter

## 2018-01-11 ENCOUNTER — Encounter: Payer: Self-pay | Admitting: Internal Medicine

## 2018-01-29 ENCOUNTER — Ambulatory Visit (INDEPENDENT_AMBULATORY_CARE_PROVIDER_SITE_OTHER): Payer: Medicare Other | Admitting: Internal Medicine

## 2018-01-29 VITALS — Ht 65.0 in | Wt 106.0 lb

## 2018-01-29 DIAGNOSIS — I10 Essential (primary) hypertension: Secondary | ICD-10-CM

## 2018-01-29 DIAGNOSIS — I480 Paroxysmal atrial fibrillation: Secondary | ICD-10-CM

## 2018-01-29 DIAGNOSIS — I495 Sick sinus syndrome: Secondary | ICD-10-CM | POA: Diagnosis not present

## 2018-01-29 DIAGNOSIS — Z23 Encounter for immunization: Secondary | ICD-10-CM

## 2018-01-29 DIAGNOSIS — Z950 Presence of cardiac pacemaker: Secondary | ICD-10-CM

## 2018-01-29 LAB — CUP PACEART INCLINIC DEVICE CHECK
Battery Impedance: 253 Ohm
Battery Remaining Longevity: 114 mo
Battery Voltage: 2.78 V
Brady Statistic AS VS Percent: 16.1 %
Implantable Lead Implant Date: 20150730
Implantable Lead Location: 753859
Implantable Lead Location: 753860
Implantable Lead Model: 5076
Implantable Lead Model: 5076
Implantable Pulse Generator Implant Date: 20150730
Lead Channel Impedance Value: 480 Ohm
Lead Channel Pacing Threshold Amplitude: 1 V
Lead Channel Pacing Threshold Pulse Width: 0.4 ms
Lead Channel Sensing Intrinsic Amplitude: 2 mV
Lead Channel Setting Pacing Amplitude: 2 V
Lead Channel Setting Pacing Amplitude: 2.5 V
Lead Channel Setting Sensing Sensitivity: 4 mV
MDC IDC LEAD IMPLANT DT: 20150730
MDC IDC MSMT LEADCHNL RA PACING THRESHOLD AMPLITUDE: 0.75 V
MDC IDC MSMT LEADCHNL RA PACING THRESHOLD PULSEWIDTH: 0.4 ms
MDC IDC MSMT LEADCHNL RV IMPEDANCE VALUE: 487 Ohm
MDC IDC MSMT LEADCHNL RV SENSING INTR AMPL: 8 mV
MDC IDC SESS DTM: 20191014165537
MDC IDC SET LEADCHNL RV PACING PULSEWIDTH: 0.4 ms
MDC IDC STAT BRADY AP VP PERCENT: 1.4 %
MDC IDC STAT BRADY AP VS PERCENT: 81.2 %
MDC IDC STAT BRADY AS VP PERCENT: 1.3 %

## 2018-01-29 MED ORDER — ASPIRIN EC 81 MG PO TBEC: 81.0000 mg | DELAYED_RELEASE_TABLET | Freq: Every day | ORAL | 3 refills | Status: AC

## 2018-01-29 NOTE — Patient Instructions (Addendum)
Medication Instructions:  Your physician has recommended you make the following change in your medication:  1.  Stop taking Aspirin 325 mg   2.  Start taking Aspirin 81 mg one tablet by mouth daily   Labwork: None ordered.  Testing/Procedures: None ordered.  Follow-Up: Your physician wants you to follow-up in: as needed with Dr. Rayann Heman.     Remote monitoring is used to monitor your Pacemaker from home. This monitoring reduces the number of office visits required to check your device to one time per year. It allows [place] to keep an eye on the functioning of your device to ensure it is working properly. You are scheduled for a device check from home on 04/30/2018. You may send your transmission at any time that day. If you have a wireless device, the transmission will be sent automatically. After your physician reviews your transmission, you will receive a postcard with your next transmission date.  Any Other Special Instructions Will Be Listed Below (If Applicable).  If you need a refill on your cardiac medications before your next appointment, please call your pharmacy.

## 2018-01-29 NOTE — Progress Notes (Signed)
PCP: Thressa Sheller, MD Primary Cardiologist: previously Dr Angelena Form Primary EP:  Dr Rayann Heman  Katherine [name] is a 72 y.o. female who presents today for routine electrophysiology followup.  She has advanced dementia.  She declines vitals today and is resistant to our exam.  Her daughter and husband are with her today.  They are not aware of any concerns.  Past Medical History:  Diagnosis Date  . Carotid artery disease ([place])    a. Hx r carotid bruit - dopplers 2009 <50% bilat;  b. 10/2012 U/S: no hemodynamically signif stenosis.  . Coronary artery disease    a. s/p overlapping DES to RCA 04/2010 (days after neg stress test). b. Cath 06/2010 - patent stents, nonobst dz otherwise; c. 10/2012 Cath: LM nl, LAD 18m, D1/2/3 nl, LCX 20p, OM2 50p, RCA 20% ISR mid, EF 55-60% ->Med Rx.    [name] History of echocardiogram    a. 2D ECHO: 11/14/2013: EF 60-65%, mild concentric LVH, no RWMAs. G1DD. Mod LA dilation, mild RA dilation, PA pressure 31.  [name] HLD (hyperlipidemia)   . Hypertension   . Left bundle-branch block   . Monoclonal gammopathy of undetermined significance    a. Previously seen by heme - this is per chart but pt does not recall.  . Paroxysmal atrial fibrillation ([place]) 03/30/2015   observed on pacemaker interrogation, longest episode 2 minutes  . PVC's (premature ventricular contractions)   . Sick sinus syndrome ([place])    a. s/p PPM on 11/14/13 with Medtronic Adapta L model ADDRL 1 (serial number [id number]) pacemaker  . Tobacco abuse    Past Surgical History:  Procedure Laterality Date  . CARDIAC CATHETERIZATION  05/14/10   SHOWED EVIDENCE OF REVERSIBLE VASOSPASM  . INGUINAL HERNIA REPAIR    . LEFT HEART CATHETERIZATION WITH CORONARY ANGIOGRAM N/A 10/17/2012   Procedure: LEFT HEART CATHETERIZATION WITH CORONARY ANGIOGRAM;  Surgeon: Wellington Hampshire, MD;  Location: [facility];  Service: Cardiovascular;  Laterality: N/A;  . PACEMAKER INSERTION  11/14/13   MDT Adapta L implanted by Dr Rayann Heman  for Sick sinus syndrome  . PCI     HIGH GRADE LESION IN THE MIDPORTION OF THE RIGHT CORONARY ARTERY WHICH WAS A LARGE ARTERY  . PERMANENT PACEMAKER INSERTION N/A 11/14/2013   Procedure: PERMANENT PACEMAKER INSERTION;  Surgeon: Coralyn Mark, MD;  Location: [facility];  Service: Cardiovascular;  Laterality: N/A;  . TONSILLECTOMY      ROS- all systems are reviewed and negative except as per HPI above  Current Outpatient Medications  Medication Sig Dispense Refill  . ALPRAZolam (XANAX) 0.25 MG tablet Take 1 tablet (0.25 mg total) by mouth 2 (two) times daily as needed for anxiety. 60 tablet 5  . Cholecalciferol (VITAMIN D PO) Take 1 tablet by mouth daily.    . Cyanocobalamin (VITAMIN B 12 PO) Take 1 tablet by mouth daily.    [name] levothyroxine (SYNTHROID, LEVOTHROID) 25 MCG tablet Take 25 mcg by mouth daily before breakfast.    . losartan (COZAAR) 25 MG tablet TAKE 1 TABLET(25 MG) BY MOUTH DAILY 90 tablet 3  . nitroGLYCERIN (NITROSTAT) 0.4 MG SL tablet Place 0.4 mg under the tongue every 5 (five) minutes as needed for chest pain (MAX 3 TABLETS).     . pravastatin (PRAVACHOL) 80 MG tablet Take 80 mg by mouth daily.     [name] aspirin EC 81 MG tablet Take 1 tablet (81 mg total) by mouth daily. 90 tablet 3   No current  facility-administered medications for this visit.     Physical Exam: Vitals:   01/29/18 1555  Weight: 106 lb (48.1 kg)  Height: 5\' 5"  (1.651 m)   unable to obtain additional vitals due to advanced dementia and patient resistance.  GEN- The patient is ill appearing, advanced dementia, in a wheelchair today Head- normocephalic, atraumatic Eyes-  Sclera clear, conjunctiva pink Ears- hearing intact Oropharynx- clear Lungs-  , normal work of breathing Chest- pacemaker pocket is well healed Heart- Regular rate and rhythm  GI- soft, NT, ND, + BS Extremities- no clubbing, cyanosis, or edema  Pacemaker interrogation- reviewed in detail today,  See PACEART report   Assessment and  Plan:  1. Symptomatic sinus bradycardia  Normal pacemaker function See Pace Art report No changes today  2. HTN Stable No change required today  3. CAD No ischemic symptoms No changes Reduce asa to 81mg  daily today  4. Paroxysmal atrial fibrillation afib burden is <0.1% (longest 9 min) Not a candidate for anticoagulation  carelink Return as needed for EP management.  Given her advanced dementia, it is a real struggle for her to come to our office.  I have agreed to remain her EP doctor and follow closely with remote monitoring services.  We are happy to refill medicines and assist at any time, without required office visits.  Thompson Grayer MD, [facility] 01/29/2018 4:03 PM

## 2018-04-30 ENCOUNTER — Ambulatory Visit: Payer: Medicare Other

## 2018-05-02 ENCOUNTER — Telehealth: Payer: Self-pay

## 2018-05-02 NOTE — Telephone Encounter (Signed)
Spoke with spouse to remind of missed remote transmission 

## 2018-05-03 LAB — CUP PACEART REMOTE DEVICE CHECK
Battery Remaining Longevity: 112 mo
Brady Statistic AP VP Percent: 1 %
Brady Statistic AS VS Percent: 29 %
Date Time Interrogation Session: 20200115183217
Implantable Lead Implant Date: 20150730
Implantable Lead Location: 753860
Implantable Lead Model: 5076
Implantable Pulse Generator Implant Date: 20150730
Lead Channel Pacing Threshold Amplitude: 0.5 V
Lead Channel Pacing Threshold Pulse Width: 0.4 ms
Lead Channel Pacing Threshold Pulse Width: 0.4 ms
Lead Channel Setting Pacing Amplitude: 2 V
Lead Channel Setting Pacing Pulse Width: 0.4 ms
MDC IDC LEAD IMPLANT DT: 20150730
MDC IDC LEAD LOCATION: 753859
MDC IDC MSMT BATTERY IMPEDANCE: 302 Ohm
MDC IDC MSMT BATTERY VOLTAGE: 2.78 V
MDC IDC MSMT LEADCHNL RA IMPEDANCE VALUE: 516 Ohm
MDC IDC MSMT LEADCHNL RV IMPEDANCE VALUE: 467 Ohm
MDC IDC MSMT LEADCHNL RV PACING THRESHOLD AMPLITUDE: 0.75 V
MDC IDC SET LEADCHNL RV PACING AMPLITUDE: 2.5 V
MDC IDC SET LEADCHNL RV SENSING SENSITIVITY: 4 mV
MDC IDC STAT BRADY AP VS PERCENT: 70 %
MDC IDC STAT BRADY AS VP PERCENT: 1 %

## 2018-05-10 ENCOUNTER — Other Ambulatory Visit: Payer: Self-pay | Admitting: Cardiovascular Disease

## 2018-05-30 ENCOUNTER — Other Ambulatory Visit: Payer: Self-pay | Admitting: Neurology

## 2018-06-29 ENCOUNTER — Telehealth: Payer: Self-pay | Admitting: Neurology

## 2018-06-29 NOTE — Telephone Encounter (Signed)
Amy from [facility] is calling in wanting to know if Dr Brett Fairy is agreeable to be the attending for hospice.  CB# [phone]

## 2018-06-29 NOTE — Telephone Encounter (Signed)
Her PCP is out on disability- I would defer to his GROUP practice.

## 2018-06-29 NOTE — Telephone Encounter (Signed)
Katherine [name], spoke with Katherine [name] and advised Dr Brett Fairy would like them to contact patient's PCP medical group to be her attending physician. Katherine [name]  verbalized understanding, appreciation.

## 2018-07-18 DEATH — deceased
